# Patient Record
Sex: Female | Born: 1985 | Race: Black or African American | Hispanic: No | Marital: Single | Smoking: Never smoker
Health system: Southern US, Community
[De-identification: ages and names within clinical notes are randomized; demographics above are authoritative.]

## PROBLEM LIST (undated history)

## (undated) DIAGNOSIS — I82409 Acute embolism and thrombosis of unspecified deep veins of unspecified lower extremity: Secondary | ICD-10-CM

## (undated) DIAGNOSIS — E079 Disorder of thyroid, unspecified: Secondary | ICD-10-CM

## (undated) DIAGNOSIS — E119 Type 2 diabetes mellitus without complications: Secondary | ICD-10-CM

## (undated) DIAGNOSIS — I2699 Other pulmonary embolism without acute cor pulmonale: Secondary | ICD-10-CM

## (undated) DIAGNOSIS — M1990 Unspecified osteoarthritis, unspecified site: Secondary | ICD-10-CM

## (undated) DIAGNOSIS — M329 Systemic lupus erythematosus, unspecified: Secondary | ICD-10-CM

## (undated) HISTORY — PX: PACEMAKER INSERTION: SHX728

---

## 2015-01-17 ENCOUNTER — Encounter (HOSPITAL_COMMUNITY): Payer: Self-pay | Admitting: Emergency Medicine

## 2015-01-17 DIAGNOSIS — N049 Nephrotic syndrome with unspecified morphologic changes: Secondary | ICD-10-CM | POA: Diagnosis present

## 2015-01-17 DIAGNOSIS — N189 Chronic kidney disease, unspecified: Secondary | ICD-10-CM | POA: Diagnosis present

## 2015-01-17 DIAGNOSIS — Z794 Long term (current) use of insulin: Secondary | ICD-10-CM

## 2015-01-17 DIAGNOSIS — E079 Disorder of thyroid, unspecified: Secondary | ICD-10-CM | POA: Diagnosis present

## 2015-01-17 DIAGNOSIS — E1165 Type 2 diabetes mellitus with hyperglycemia: Secondary | ICD-10-CM | POA: Diagnosis present

## 2015-01-17 DIAGNOSIS — Z882 Allergy status to sulfonamides status: Secondary | ICD-10-CM

## 2015-01-17 DIAGNOSIS — Z950 Presence of cardiac pacemaker: Secondary | ICD-10-CM

## 2015-01-17 DIAGNOSIS — J9811 Atelectasis: Secondary | ICD-10-CM | POA: Diagnosis present

## 2015-01-17 DIAGNOSIS — M1990 Unspecified osteoarthritis, unspecified site: Secondary | ICD-10-CM | POA: Diagnosis present

## 2015-01-17 DIAGNOSIS — Z79899 Other long term (current) drug therapy: Secondary | ICD-10-CM

## 2015-01-17 DIAGNOSIS — Z7952 Long term (current) use of systemic steroids: Secondary | ICD-10-CM

## 2015-01-17 DIAGNOSIS — I499 Cardiac arrhythmia, unspecified: Secondary | ICD-10-CM | POA: Diagnosis present

## 2015-01-17 DIAGNOSIS — Z880 Allergy status to penicillin: Secondary | ICD-10-CM

## 2015-01-17 DIAGNOSIS — N179 Acute kidney failure, unspecified: Principal | ICD-10-CM | POA: Diagnosis present

## 2015-01-17 DIAGNOSIS — Z86711 Personal history of pulmonary embolism: Secondary | ICD-10-CM

## 2015-01-17 DIAGNOSIS — K859 Acute pancreatitis, unspecified: Secondary | ICD-10-CM | POA: Diagnosis present

## 2015-01-17 DIAGNOSIS — M549 Dorsalgia, unspecified: Secondary | ICD-10-CM | POA: Diagnosis present

## 2015-01-17 DIAGNOSIS — M329 Systemic lupus erythematosus, unspecified: Secondary | ICD-10-CM | POA: Diagnosis present

## 2015-01-17 DIAGNOSIS — I517 Cardiomegaly: Secondary | ICD-10-CM | POA: Diagnosis present

## 2015-01-17 DIAGNOSIS — G8929 Other chronic pain: Secondary | ICD-10-CM | POA: Diagnosis present

## 2015-01-17 DIAGNOSIS — D638 Anemia in other chronic diseases classified elsewhere: Secondary | ICD-10-CM | POA: Diagnosis present

## 2015-01-17 DIAGNOSIS — N2889 Other specified disorders of kidney and ureter: Secondary | ICD-10-CM | POA: Diagnosis present

## 2015-01-17 DIAGNOSIS — Z9114 Patient's other noncompliance with medication regimen: Secondary | ICD-10-CM | POA: Diagnosis present

## 2015-01-17 NOTE — ED Notes (Signed)
Pt. reports central chest pain and mid back pain with SOB onset last week , denies nausea or diaphoresis . No cough or congestion . Denies fever or chills.

## 2015-01-18 ENCOUNTER — Observation Stay (HOSPITAL_COMMUNITY): Payer: Medicaid Other

## 2015-01-18 ENCOUNTER — Inpatient Hospital Stay (HOSPITAL_COMMUNITY): Payer: Medicaid Other

## 2015-01-18 ENCOUNTER — Encounter (HOSPITAL_COMMUNITY): Payer: Self-pay

## 2015-01-18 ENCOUNTER — Emergency Department (HOSPITAL_COMMUNITY): Payer: Medicaid Other

## 2015-01-18 ENCOUNTER — Inpatient Hospital Stay (HOSPITAL_COMMUNITY)
Admission: EM | Admit: 2015-01-18 | Discharge: 2015-01-20 | DRG: 682 | Discharge status: Against Medical Advice | Payer: Medicaid Other | Attending: Internal Medicine | Admitting: Internal Medicine

## 2015-01-18 DIAGNOSIS — K859 Acute pancreatitis without necrosis or infection, unspecified: Secondary | ICD-10-CM

## 2015-01-18 DIAGNOSIS — M329 Systemic lupus erythematosus, unspecified: Secondary | ICD-10-CM

## 2015-01-18 DIAGNOSIS — N184 Chronic kidney disease, stage 4 (severe): Secondary | ICD-10-CM

## 2015-01-18 DIAGNOSIS — N049 Nephrotic syndrome with unspecified morphologic changes: Secondary | ICD-10-CM | POA: Diagnosis present

## 2015-01-18 DIAGNOSIS — I499 Cardiac arrhythmia, unspecified: Secondary | ICD-10-CM | POA: Diagnosis present

## 2015-01-18 DIAGNOSIS — M1990 Unspecified osteoarthritis, unspecified site: Secondary | ICD-10-CM | POA: Diagnosis present

## 2015-01-18 DIAGNOSIS — Z79899 Other long term (current) drug therapy: Secondary | ICD-10-CM | POA: Diagnosis not present

## 2015-01-18 DIAGNOSIS — N189 Chronic kidney disease, unspecified: Secondary | ICD-10-CM | POA: Diagnosis present

## 2015-01-18 DIAGNOSIS — N179 Acute kidney failure, unspecified: Secondary | ICD-10-CM | POA: Diagnosis present

## 2015-01-18 DIAGNOSIS — Z9114 Patient's other noncompliance with medication regimen: Secondary | ICD-10-CM | POA: Diagnosis present

## 2015-01-18 DIAGNOSIS — Z86711 Personal history of pulmonary embolism: Secondary | ICD-10-CM | POA: Diagnosis not present

## 2015-01-18 DIAGNOSIS — E1165 Type 2 diabetes mellitus with hyperglycemia: Secondary | ICD-10-CM | POA: Diagnosis present

## 2015-01-18 DIAGNOSIS — E079 Disorder of thyroid, unspecified: Secondary | ICD-10-CM | POA: Diagnosis present

## 2015-01-18 DIAGNOSIS — M549 Dorsalgia, unspecified: Secondary | ICD-10-CM | POA: Diagnosis present

## 2015-01-18 DIAGNOSIS — D638 Anemia in other chronic diseases classified elsewhere: Secondary | ICD-10-CM | POA: Diagnosis present

## 2015-01-18 DIAGNOSIS — Z880 Allergy status to penicillin: Secondary | ICD-10-CM | POA: Diagnosis not present

## 2015-01-18 DIAGNOSIS — R079 Chest pain, unspecified: Secondary | ICD-10-CM

## 2015-01-18 DIAGNOSIS — D649 Anemia, unspecified: Secondary | ICD-10-CM

## 2015-01-18 DIAGNOSIS — N289 Disorder of kidney and ureter, unspecified: Secondary | ICD-10-CM

## 2015-01-18 DIAGNOSIS — J9811 Atelectasis: Secondary | ICD-10-CM | POA: Diagnosis present

## 2015-01-18 DIAGNOSIS — Z794 Long term (current) use of insulin: Secondary | ICD-10-CM | POA: Diagnosis not present

## 2015-01-18 DIAGNOSIS — N2889 Other specified disorders of kidney and ureter: Secondary | ICD-10-CM | POA: Diagnosis present

## 2015-01-18 DIAGNOSIS — Z950 Presence of cardiac pacemaker: Secondary | ICD-10-CM | POA: Diagnosis not present

## 2015-01-18 DIAGNOSIS — E119 Type 2 diabetes mellitus without complications: Secondary | ICD-10-CM

## 2015-01-18 DIAGNOSIS — G8929 Other chronic pain: Secondary | ICD-10-CM | POA: Diagnosis present

## 2015-01-18 DIAGNOSIS — Z882 Allergy status to sulfonamides status: Secondary | ICD-10-CM | POA: Diagnosis not present

## 2015-01-18 DIAGNOSIS — I517 Cardiomegaly: Secondary | ICD-10-CM | POA: Diagnosis present

## 2015-01-18 DIAGNOSIS — R509 Fever, unspecified: Secondary | ICD-10-CM

## 2015-01-18 DIAGNOSIS — Z7952 Long term (current) use of systemic steroids: Secondary | ICD-10-CM | POA: Diagnosis not present

## 2015-01-18 HISTORY — DX: Unspecified osteoarthritis, unspecified site: M19.90

## 2015-01-18 HISTORY — DX: Disorder of thyroid, unspecified: E07.9

## 2015-01-18 HISTORY — DX: Type 2 diabetes mellitus without complications: E11.9

## 2015-01-18 HISTORY — DX: Systemic lupus erythematosus, unspecified: M32.9

## 2015-01-18 LAB — CBC
HEMATOCRIT: 21.3 % — AB (ref 36.0–46.0)
Hemoglobin: 6.9 g/dL — CL (ref 12.0–15.0)
MCH: 28.6 pg (ref 26.0–34.0)
MCHC: 32.4 g/dL (ref 30.0–36.0)
MCV: 88.4 fL (ref 78.0–100.0)
Platelets: 323 10*3/uL (ref 150–400)
RBC: 2.41 MIL/uL — ABNORMAL LOW (ref 3.87–5.11)
RDW: 17.4 % — AB (ref 11.5–15.5)
WBC: 7.5 10*3/uL (ref 4.0–10.5)

## 2015-01-18 LAB — RETICULOCYTES
RBC.: 4.42 MIL/uL (ref 3.87–5.11)
RETIC CT PCT: 0.7 % (ref 0.4–3.1)
Retic Count, Absolute: 30.9 10*3/uL (ref 19.0–186.0)

## 2015-01-18 LAB — COMPREHENSIVE METABOLIC PANEL
ALT: 18 U/L (ref 0–35)
AST: 17 U/L (ref 0–37)
Albumin: 2.3 g/dL — ABNORMAL LOW (ref 3.5–5.2)
Alkaline Phosphatase: 55 U/L (ref 39–117)
Anion gap: 10 (ref 5–15)
BUN: 36 mg/dL — ABNORMAL HIGH (ref 6–23)
CO2: 21 mmol/L (ref 19–32)
CREATININE: 2.75 mg/dL — AB (ref 0.50–1.10)
Calcium: 7.7 mg/dL — ABNORMAL LOW (ref 8.4–10.5)
Chloride: 106 mmol/L (ref 96–112)
GFR calc Af Amer: 26 mL/min — ABNORMAL LOW (ref >90)
GFR calc non Af Amer: 22 mL/min — ABNORMAL LOW (ref >90)
Glucose, Bld: 109 mg/dL — ABNORMAL HIGH (ref 70–99)
Potassium: 4.6 mmol/L (ref 3.5–5.1)
Sodium: 137 mmol/L (ref 135–145)
Total Bilirubin: 0.5 mg/dL (ref 0.3–1.2)
Total Protein: 5.2 g/dL — ABNORMAL LOW (ref 6.0–8.3)

## 2015-01-18 LAB — BASIC METABOLIC PANEL
Anion gap: 13 (ref 5–15)
BUN: 38 mg/dL — ABNORMAL HIGH (ref 6–23)
CALCIUM: 7.8 mg/dL — AB (ref 8.4–10.5)
CHLORIDE: 106 mmol/L (ref 96–112)
CO2: 20 mmol/L (ref 19–32)
CREATININE: 3.05 mg/dL — AB (ref 0.50–1.10)
GFR, EST AFRICAN AMERICAN: 23 mL/min — AB (ref >90)
GFR, EST NON AFRICAN AMERICAN: 19 mL/min — AB (ref >90)
Glucose, Bld: 191 mg/dL — ABNORMAL HIGH (ref 70–99)
Potassium: 4.5 mmol/L (ref 3.5–5.1)
SODIUM: 139 mmol/L (ref 135–145)

## 2015-01-18 LAB — I-STAT TROPONIN, ED: Troponin i, poc: 0 ng/mL (ref 0.00–0.08)

## 2015-01-18 LAB — BRAIN NATRIURETIC PEPTIDE: B Natriuretic Peptide: 69.5 pg/mL (ref 0.0–100.0)

## 2015-01-18 LAB — TROPONIN I: Troponin I: <0.03 ng/mL (ref <0.031)

## 2015-01-18 LAB — GLUCOSE, CAPILLARY
GLUCOSE-CAPILLARY: 118 mg/dL — AB (ref 70–99)
GLUCOSE-CAPILLARY: 111 mg/dL — AB (ref 70–99)
GLUCOSE-CAPILLARY: 133 mg/dL — AB (ref 70–99)
Glucose-Capillary: 151 mg/dL — ABNORMAL HIGH (ref 70–99)
Glucose-Capillary: 85 mg/dL (ref 70–99)

## 2015-01-18 LAB — ABO/RH: ABO/RH(D): O POS

## 2015-01-18 LAB — MRSA PCR SCREENING: MRSA by PCR: POSITIVE — AB

## 2015-01-18 LAB — PREPARE RBC (CROSSMATCH)

## 2015-01-18 LAB — CBG MONITORING, ED: GLUCOSE-CAPILLARY: 96 mg/dL (ref 70–99)

## 2015-01-18 LAB — OCCULT BLOOD X 1 CARD TO LAB, STOOL: Fecal Occult Bld: NEGATIVE

## 2015-01-18 LAB — SEDIMENTATION RATE: Sed Rate: 36 mm/hr — ABNORMAL HIGH (ref 0–22)

## 2015-01-18 MED ORDER — SODIUM CHLORIDE 0.9 % IV SOLN
10.0000 mL/h | Freq: Once | INTRAVENOUS | Status: AC
  Administered 2015-01-18: 10 mL/h via INTRAVENOUS

## 2015-01-18 MED ORDER — MORPHINE SULFATE 2 MG/ML IJ SOLN
2.0000 mg | INTRAMUSCULAR | Status: DC | PRN
  Administered 2015-01-18 – 2015-01-19 (×2): 2 mg via INTRAVENOUS
  Filled 2015-01-18 (×2): qty 1

## 2015-01-18 MED ORDER — MUPIROCIN 2 % EX OINT
1.0000 "application " | TOPICAL_OINTMENT | Freq: Two times a day (BID) | CUTANEOUS | Status: DC
  Administered 2015-01-18 – 2015-01-20 (×4): 1 via NASAL
  Filled 2015-01-18: qty 22

## 2015-01-18 MED ORDER — HYDROMORPHONE HCL 1 MG/ML IJ SOLN
1.0000 mg | INTRAMUSCULAR | Status: DC | PRN
  Administered 2015-01-18 (×2): 1 mg via INTRAVENOUS
  Filled 2015-01-18: qty 1

## 2015-01-18 MED ORDER — MORPHINE SULFATE 4 MG/ML IJ SOLN
4.0000 mg | Freq: Once | INTRAMUSCULAR | Status: AC
  Administered 2015-01-18: 4 mg via INTRAVENOUS
  Filled 2015-01-18: qty 1

## 2015-01-18 MED ORDER — HEPARIN (PORCINE) IN NACL 100-0.45 UNIT/ML-% IJ SOLN
1000.0000 [IU]/h | INTRAMUSCULAR | Status: DC
  Administered 2015-01-18 – 2015-01-19 (×2): 1000 [IU]/h via INTRAVENOUS
  Filled 2015-01-18 (×2): qty 250

## 2015-01-18 MED ORDER — OXYCODONE HCL 5 MG PO TABS
5.0000 mg | ORAL_TABLET | ORAL | Status: DC | PRN
  Administered 2015-01-18: 5 mg via ORAL
  Filled 2015-01-18: qty 1

## 2015-01-18 MED ORDER — SODIUM CHLORIDE 0.9 % IV SOLN
INTRAVENOUS | Status: DC
  Administered 2015-01-18: 15:00:00 via INTRAVENOUS

## 2015-01-18 MED ORDER — HYDROMORPHONE HCL 1 MG/ML IJ SOLN
INTRAMUSCULAR | Status: AC
  Administered 2015-01-18: 1 mg via INTRAVENOUS
  Filled 2015-01-18: qty 1

## 2015-01-18 MED ORDER — INSULIN ASPART 100 UNIT/ML ~~LOC~~ SOLN
0.0000 [IU] | SUBCUTANEOUS | Status: DC
  Administered 2015-01-18: 1 [IU] via SUBCUTANEOUS
  Administered 2015-01-19 (×2): 2 [IU] via SUBCUTANEOUS
  Administered 2015-01-20: 1 [IU] via SUBCUTANEOUS

## 2015-01-18 MED ORDER — CHLORHEXIDINE GLUCONATE CLOTH 2 % EX PADS
6.0000 | MEDICATED_PAD | Freq: Every day | CUTANEOUS | Status: DC
  Administered 2015-01-19 – 2015-01-20 (×2): 6 via TOPICAL

## 2015-01-18 MED ORDER — SODIUM CHLORIDE 0.9 % IJ SOLN
3.0000 mL | Freq: Two times a day (BID) | INTRAMUSCULAR | Status: DC
  Administered 2015-01-18 – 2015-01-20 (×4): 3 mL via INTRAVENOUS

## 2015-01-18 MED ORDER — OXYCODONE HCL 5 MG PO TABS
5.0000 mg | ORAL_TABLET | ORAL | Status: DC | PRN
  Administered 2015-01-18 – 2015-01-19 (×3): 5 mg via ORAL
  Filled 2015-01-18 (×3): qty 1

## 2015-01-18 MED ORDER — HEPARIN SODIUM (PORCINE) 5000 UNIT/ML IJ SOLN
5000.0000 [IU] | Freq: Three times a day (TID) | INTRAMUSCULAR | Status: DC
  Administered 2015-01-18: 5000 [IU] via SUBCUTANEOUS

## 2015-01-18 NOTE — H&P (Signed)
Hospitalist Admission History and Physical  Patient name: Paige Aguilar Medical record number: [id number] Date of birth: 11-18-1985 Age: 29 y.o. Gender: female  Primary Care Provider: No PCP Per Patient  Chief Complaint: chest pain, dyspnea, anemia, AKI, hyperglycemia  History of Present Illness:This is a 30 y.o. year old female with significant past medical history of SLE, cardiac arrythmia s/p pacemaker, diabetes mellitus  presenting with chest pain, dyspnea, anemia, AK I, hyperglycemia. Patient is originally from [place]. Has relocated across multiple locations. However is [place]. Patient reports 3 weeks of intermittent chest pain and dyspnea. Chest pain predominantly peripheral allergic in nature with pain mainly with deep breathing. No fevers or chills. Chest pain central in nature with some radiation to the back. No nausea or vomiting. No radiation down the left arm or neck. Has a baseline history of lupus as well as diabetes. Has not been followed for this. Reports pacemaker placement approximately 1-2 years ago for cardiac arrhythmia. Is not currently taking any medications. Presented to the ER afebrile, hemodynamically stable. Noted hemoglobin of 6.9, creatinine 3.05. Chest x-ray with mild bilateral atelectasis. Borderline cardiomegaly. BNP WNL. Troponin negative 1. EKG sinus tachycardia with no acute ST or T wave abnormalities.  Assessment and Plan:  Active Problems:   Chest pain   Anemia   AKI (acute kidney injury)   Diabetes mellitus   SLE (systemic lupus erythematosus)   1- Chest Pain  - atypical and pleuritic in nature -trop and EKG WNL -CXR w/ bilateral atelectasis and cardiomegaly  -cycle CEs  -baseline chest CT given lupus w/ no follow up  -baby ASA  -2D ECHO  -noted hx/o cardiac arrythmia s/p pacemaker/ICD -consider cards consult as clinically indicated  2- Anemia -likely anemia of chronic disease in setting of  SLE -denies any overt bleeding -pending 2 unit pRBCs -anemia panel  -follow  3- AKI -unclear of baseline -urinalysis pending -renal u/s -? Lupus nephritis  -renal consult in am  4-Diabetes -A1C -SSI  5-SLE -has had no follow up for this outpatient -baseline ANA, RF, ESR, CRP   FEN/GI: heart healthy, carb modified diet  Prophylaxis: sub q heparin  Disposition: pending further evaluation  Code Status:Full Code    Patient Active Problem List   Diagnosis Date Noted  . Chest pain 01/18/2015   Past Medical History: Past Medical History  Diagnosis Date  . Lupus   . Arthritis   . Thyroid disease   . Diabetes mellitus without complication     Past Surgical History: Past Surgical History  Procedure Laterality Date  . Pacemaker insertion    . Cesarean section      Social History: History   Social History  . Marital Status: Single    Spouse Name: N/A  . Number of Children: N/A  . Years of Education: N/A   Social History Main Topics  . Smoking status: Never Smoker   . Smokeless tobacco: Not on file  . Alcohol Use: No  . Drug Use: No  . Sexual Activity: Not on file   Other Topics Concern  . None   Social History Narrative  . None    Family History: No family history on file.  Allergies: Allergies  Allergen Reactions  . Amoxicillin Rash  . Sulfa Antibiotics Rash    Current Facility-Administered Medications  Medication Dose Route Frequency Provider Last Rate Last Dose  . 0.9 %  sodium chloride infusion  10 mL/hr Intravenous Once Junius Creamer, NP      .  0.9 %  sodium chloride infusion   Intravenous Continuous Deneise Lever, MD      . heparin injection 5,000 Units  5,000 Units Subcutaneous 3 times per day Deneise Lever, MD      . insulin aspart (novoLOG) injection 0-9 Units  0-9 Units Subcutaneous 6 times per day Deneise Lever, MD      . sodium chloride 0.9 % injection 3 mL  3 mL Intravenous Q12H Deneise Lever, MD       Current Outpatient  Prescriptions  Medication Sig Dispense Refill  . CLONIDINE HCL PO Take 1 tablet by mouth daily.    Marland Kitchen FLUoxetine HCl (PROZAC PO) Take 1 capsule by mouth daily.    . insulin aspart (NOVOLOG) 100 UNIT/ML injection Inject 10 Units into the skin daily.    Marland Kitchen oxycodone (ROXICODONE) 30 MG immediate release tablet Take 30 mg by mouth every 4 (four) hours as needed for pain.    Marland Kitchen oxyCODONE-acetaminophen (PERCOCET) 10-325 MG per tablet Take 1 tablet by mouth every 4 (four) hours as needed for pain.    . predniSONE (DELTASONE) 10 MG tablet Take 10 mg by mouth daily with breakfast.    . QUEtiapine Fumarate (SEROQUEL PO) Take 1 tablet by mouth daily.     Review Of Systems: 12 point ROS negative except as noted above in HPI.  Physical Exam: Filed Vitals:   01/18/15 0337  BP: 133/86  Pulse: 88  Temp: 98.1 F (36.7 C)  Resp: 21    General: alert and cooperative HEENT: PERRLA and extra ocular movement intact Heart: S1, S2 normal, no murmur, rub or gallop, regular rate and rhythm Lungs: clear to auscultation, no wheezes or rales and unlabored breathing Abdomen: abdomen is soft without significant tenderness, masses, organomegaly or guarding Extremities: extremities normal, atraumatic, no cyanosis or edema Skin:no rashes Neurology: normal without focal findings  Labs and Imaging: Lab Results  Component Value Date/Time   NA 139 01/18/2015 12:00 AM   K 4.5 01/18/2015 12:00 AM   CL 106 01/18/2015 12:00 AM   CO2 20 01/18/2015 12:00 AM   BUN 38* 01/18/2015 12:00 AM   CREATININE 3.05* 01/18/2015 12:00 AM   GLUCOSE 191* 01/18/2015 12:00 AM   Lab Results  Component Value Date   WBC 7.5 01/18/2015   HGB 6.9* 01/18/2015   HCT 21.3* 01/18/2015   MCV 88.4 01/18/2015   PLT 323 01/18/2015    Dg Chest 2 View  01/18/2015   CLINICAL DATA:  Acute onset of chest pain.  Initial encounter.  EXAM: CHEST  2 VIEW  COMPARISON:  None.  FINDINGS: The lungs are well-aerated. Mild bilateral atelectasis is noted.  There is no evidence of focal opacification, pleural effusion or pneumothorax.  The heart is borderline enlarged. A pacemaker/AICD is noted at the left chest wall, with leads ending at the right atrium and right ventricle. No acute osseous abnormalities are seen. There is mild chronic compression deformity at the lower thoracic spine.  IMPRESSION: Mild bilateral atelectasis noted.  Borderline cardiomegaly seen.   Electronically Signed   By: Garald Balding M.D.   On: 01/18/2015 01:19           Shanda Howells MD  Pager: [phone]

## 2015-01-18 NOTE — ED Notes (Signed)
Charge Nurse informed @0605 /appt time [phone].

## 2015-01-18 NOTE — Plan of Care (Signed)
Problem: Problem: Pain Management Progression Goal: Pain controlled Outcome: Not Progressing Patient[place] pharmacy [place] and in pharmacy in [place] were contacted to verify patient stated home regimen for Percocet and Roxicodone. Neither pharmacy was able to verify dosage. Patient does not have old prescription bottles within her possession. Family also unable to obtain. Will continue to monitor. Dilaudid 1 mg IV & Roxicodone 5 mg tab ordered and given with no relief obtained. Will continue to monitor.

## 2015-01-18 NOTE — Progress Notes (Signed)
Pt arrived to floor at 0625. 1st unit of blood done transfusing before arrival to the floor. Report done at the bedside.   Tera Helperooper, Lusero Nordlund E

## 2015-01-18 NOTE — ED Provider Notes (Signed)
CSN: [id number]     Arrival date & time 01/17/15  2326 History   First MD Initiated Contact with Patient 01/18/15 0109     Chief Complaint  Patient presents with  . Chest Pain     (Consider location/radiation/quality/duration/timing/severity/associated sxs/prior Treatment) HPI Comments: This is a 29 year old female who has been traveling around the [place] area trying to find a place to live.  She's recently moved from [place] with her son.  She is an insulin-dependent diabetic, who is noncompliant with her insulin regime.  She also has a history of lupus and kidney disease is been off medications for a while.  She recently was seen in [place] and had a kidney biopsy which she states was scar tissue presents to our emergency room tonight with central chest discomfort, shortness of breath, nausea without vomiting for the past week.  Denies any fever, but does state that when she ambulates and moves around she gets extremely short of breath  Patient is a 29 y.o. female presenting with chest pain. The history is provided by the patient.  Chest Pain Pain location:  Substernal area Pain quality: aching   Pain radiates to:  Mid back Pain radiates to the back: yes   Pain severity:  Mild Onset quality:  Gradual Timing:  Constant Progression:  Unchanged Chronicity:  New Context: breathing   Relieved by:  Nothing Worsened by:  Nothing tried Ineffective treatments:  None tried Associated symptoms: nausea, shortness of breath and weakness   Associated symptoms: no cough, no dizziness, no lower extremity edema, no numbness, no palpitations, no syncope and not vomiting   Risk factors comment:  Lupus   Past Medical History  Diagnosis Date  . Lupus   . Arthritis   . Thyroid disease   . Diabetes mellitus without complication    Past Surgical History  Procedure Laterality Date  . Pacemaker insertion    . Cesarean section     No family history on file. History  Substance Use Topics   . Smoking status: Never Smoker   . Smokeless tobacco: Not on file  . Alcohol Use: No   OB History    No data available     Review of Systems  HENT: Negative for congestion.   Respiratory: Positive for shortness of breath. Negative for cough.   Cardiovascular: Positive for chest pain. Negative for palpitations and syncope.  Gastrointestinal: Positive for nausea. Negative for vomiting.  Genitourinary: Negative for dysuria.  Neurological: Positive for weakness. Negative for dizziness and numbness.      Allergies  Amoxicillin and Sulfa antibiotics  Home Medications   Prior to Admission medications   Medication Sig Start Date End Date Taking? Authorizing Provider  cloNIDine (CATAPRES) 0.1 MG tablet Take 0.1 mg by mouth 2 (two) times daily.   Yes Historical Provider, MD  FLUoxetine (PROZAC) 10 MG capsule Take 10 mg by mouth 2 (two) times daily.   Yes Historical Provider, MD  insulin aspart (NOVOLOG) 100 UNIT/ML injection Inject 10 Units into the skin daily as needed for high blood sugar.    Yes Historical Provider, MD  oxycodone (ROXICODONE) 30 MG immediate release tablet Take 30 mg by mouth every 4 (four) hours as needed for pain.   Yes Historical Provider, MD  oxyCODONE-acetaminophen (PERCOCET) 10-325 MG per tablet Take 1 tablet by mouth every 4 (four) hours as needed for pain.   Yes Historical Provider, MD  predniSONE (DELTASONE) 10 MG tablet Take 10 mg by mouth daily with breakfast.  Yes Historical Provider, MD  QUEtiapine (SEROQUEL) 50 MG tablet Take 50 mg by mouth at bedtime.   Yes Historical Provider, MD   BP 137/96 mmHg  Pulse 116  Temp(Src) 97.5 F (36.4 C) (Oral)  Resp 26  Ht 4\' 11"  (1.499 m)  Wt 146 lb 6.4 oz (66.407 kg)  BMI 29.55 kg/m2  SpO2 100% Physical Exam  Constitutional: She is oriented to person, place, and time. She appears well-developed and well-nourished.  HENT:  Head: Normocephalic.  Eyes: Pupils are equal, round, and reactive to light.  Neck:  Normal range of motion.  Cardiovascular: Normal rate and regular rhythm.   Pulmonary/Chest: Effort normal and breath sounds normal.  Abdominal: Soft. There is no tenderness.  Musculoskeletal: Normal range of motion. She exhibits no edema or tenderness.  Neurological: She is alert and oriented to person, place, and time.  Skin: Skin is warm.  Nursing note and vitals reviewed.   ED Course  Procedures (including critical care time) Labs Review Labs Reviewed  MRSA PCR SCREENING - Abnormal; Notable for the following:    MRSA by PCR POSITIVE (*)    All other components within normal limits  CBC - Abnormal; Notable for the following:    RBC 2.41 (*)    Hemoglobin 6.9 (*)    HCT 21.3 (*)    RDW 17.4 (*)    All other components within normal limits  BASIC METABOLIC PANEL - Abnormal; Notable for the following:    Glucose, Bld 191 (*)    BUN 38 (*)    Creatinine, Ser 3.05 (*)    Calcium 7.8 (*)    GFR calc non Af Amer 19 (*)    GFR calc Af Amer 23 (*)    All other components within normal limits  VITAMIN B12 - Abnormal; Notable for the following:    Vitamin B-12 >2000 (*)    All other components within normal limits  IRON AND TIBC - Abnormal; Notable for the following:    TIBC 158 (*)    UIBC 85 (*)    All other components within normal limits  FERRITIN - Abnormal; Notable for the following:    Ferritin 843 (*)    All other components within normal limits  COMPREHENSIVE METABOLIC PANEL - Abnormal; Notable for the following:    Glucose, Bld 109 (*)    BUN 36 (*)    Creatinine, Ser 2.75 (*)    Calcium 7.7 (*)    Total Protein 5.2 (*)    Albumin 2.3 (*)    GFR calc non Af Amer 22 (*)    GFR calc Af Amer 26 (*)    All other components within normal limits  CBC WITH DIFFERENTIAL/PLATELET - Abnormal; Notable for the following:    RBC 3.41 (*)    Hemoglobin 9.6 (*)    HCT 28.7 (*)    RDW 18.0 (*)    All other components within normal limits  HEMOGLOBIN A1C - Abnormal; Notable  for the following:    Hgb A1c MFr Bld 5.9 (*)    All other components within normal limits  SEDIMENTATION RATE - Abnormal; Notable for the following:    Sed Rate 36 (*)    All other components within normal limits  C-REACTIVE PROTEIN - Abnormal; Notable for the following:    CRP 0.9 (*)    All other components within normal limits  GLUCOSE, CAPILLARY - Abnormal; Notable for the following:    Glucose-Capillary 118 (*)    All  other components within normal limits  COMPREHENSIVE METABOLIC PANEL - Abnormal; Notable for the following:    BUN 37 (*)    Creatinine, Ser 2.78 (*)    Calcium 7.7 (*)    Total Protein 5.1 (*)    Albumin 2.3 (*)    GFR calc non Af Amer 22 (*)    GFR calc Af Amer 25 (*)    All other components within normal limits  CBC WITH DIFFERENTIAL/PLATELET - Abnormal; Notable for the following:    RBC 3.47 (*)    Hemoglobin 9.6 (*)    HCT 29.3 (*)    RDW 17.9 (*)    All other components within normal limits  GLUCOSE, CAPILLARY - Abnormal; Notable for the following:    Glucose-Capillary 111 (*)    All other components within normal limits  HEPARIN LEVEL (UNFRACTIONATED) - Abnormal; Notable for the following:    Heparin Unfractionated 0.23 (*)    All other components within normal limits  GLUCOSE, CAPILLARY - Abnormal; Notable for the following:    Glucose-Capillary 133 (*)    All other components within normal limits  GLUCOSE, CAPILLARY - Abnormal; Notable for the following:    Glucose-Capillary 151 (*)    All other components within normal limits  LIPID PANEL - Abnormal; Notable for the following:    Cholesterol 226 (*)    Triglycerides 202 (*)    LDL Cholesterol 134 (*)    All other components within normal limits  URINALYSIS, ROUTINE W REFLEX MICROSCOPIC - Abnormal; Notable for the following:    APPearance CLOUDY (*)    Protein, ur >300 (*)    All other components within normal limits  ANTI-DNA ANTIBODY, DOUBLE-STRANDED - Abnormal; Notable for the following:     ds DNA Ab 220 (*)    All other components within normal limits  URINE MICROSCOPIC-ADD ON - Abnormal; Notable for the following:    Squamous Epithelial / LPF FEW (*)    All other components within normal limits  GLUCOSE, CAPILLARY - Abnormal; Notable for the following:    Glucose-Capillary 297 (*)    All other components within normal limits  PROTEIN / CREATININE RATIO, URINE - Abnormal; Notable for the following:    Protein Creatinine Ratio 3.46 (*)    All other components within normal limits  C3 COMPLEMENT - Abnormal; Notable for the following:    C3 Complement 39 (*)    All other components within normal limits  C4 COMPLEMENT - Abnormal; Notable for the following:    Complement C4, Body Fluid 11 (*)    All other components within normal limits  GLUCOSE, CAPILLARY - Abnormal; Notable for the following:    Glucose-Capillary 113 (*)    All other components within normal limits  CBC WITH DIFFERENTIAL/PLATELET - Abnormal; Notable for the following:    RBC 3.16 (*)    Hemoglobin 8.7 (*)    HCT 27.2 (*)    RDW 17.9 (*)    All other components within normal limits  COMPREHENSIVE METABOLIC PANEL - Abnormal; Notable for the following:    BUN 35 (*)    Creatinine, Ser 2.92 (*)    Calcium 7.7 (*)    Total Protein 4.9 (*)    Albumin 2.2 (*)    GFR calc non Af Amer 20 (*)    GFR calc Af Amer 24 (*)    All other components within normal limits  GLUCOSE, CAPILLARY - Abnormal; Notable for the following:    Glucose-Capillary 110 (*)  All other components within normal limits  GLUCOSE, CAPILLARY - Abnormal; Notable for the following:    Glucose-Capillary 102 (*)    All other components within normal limits  CBC - Abnormal; Notable for the following:    Hemoglobin 11.0 (*)    HCT 32.8 (*)    RDW 18.2 (*)    All other components within normal limits  GLUCOSE, CAPILLARY - Abnormal; Notable for the following:    Glucose-Capillary 63 (*)    All other components within normal limits   GLUCOSE, CAPILLARY - Abnormal; Notable for the following:    Glucose-Capillary 127 (*)    All other components within normal limits  URINE CULTURE  CULTURE, BLOOD (ROUTINE X 2)  CULTURE, BLOOD (ROUTINE X 2)  BRAIN NATRIURETIC PEPTIDE  FOLATE  RETICULOCYTES  TROPONIN I  RHEUMATOID FACTOR  GLUCOSE, CAPILLARY  OCCULT BLOOD X 1 CARD TO LAB, STOOL  TROPONIN I  TROPONIN I  HEPARIN LEVEL (UNFRACTIONATED)  GLUCOSE, CAPILLARY  LIPASE, BLOOD  GLUCOSE, CAPILLARY  PREGNANCY, URINE  PROTIME-INR  PROTIME-INR  LIPASE, BLOOD  LACTIC ACID, PLASMA  HEPARIN LEVEL (UNFRACTIONATED)  INFLUENZA PANEL BY PCR (TYPE A & B, H1N1)  GLUCOSE, CAPILLARY  I-STAT TROPOININ, ED  CBG MONITORING, ED  TYPE AND SCREEN  ABO/RH  PREPARE RBC (CROSSMATCH)    Imaging Review No results found.   EKG Interpretation   Date/Time:  Monday January 17 2015 23:37:07 EDT Ventricular Rate:  101 PR Interval:  128 QRS Duration: 80 QT Interval:  338 QTC Calculation: 438 R Axis:   8 Text Interpretation:  Sinus tachycardia No previous tracing Confirmed by  Denton Lank  MD, Caryn Bee ([id number]) on 01/18/2015 2:02:38 AM      MDM   Final diagnoses:  Chest pain  AKI (acute kidney injury)  Renal lesion  Pancreatitis  Chest pain, unspecified chest pain type  ARF (acute renal failure)  Fever         Earley Favor, NP 01/27/15 2325  Cathren Laine, MD 02/03/15 1726

## 2015-01-18 NOTE — Progress Notes (Addendum)
Patient Demographics  Paige Aguilar, is a 29 y.o. female, DOB - 07/12/1986, QAS:[id number]  Admit date - 01/18/2015   Admitting Physician Deneise Lever, MD  Outpatient Primary MD for the patient is No PCP Per Patient  LOS - 0   Chief Complaint  Patient presents with  . Chest Pain        Subjective:   [name] today has, No headache, No chest pain, - No Nausea, No new weakness tingling or numbness, No Cough - SOB. Complains of back pain, (chronic).  Assessment & Plan    Active Problems:   Chest pain   Anemia   AKI (acute kidney injury)   Diabetes mellitus   SLE (systemic lupus erythematosus)  Chest Pain  - nontypical and pleuritic in nature, patient with known history of PE diagnosed in August 2015, known history of noncompliance, we'll resume on heparin pending her Hemoccult stool. - EKG WNL -CXR w/ bilateral atelectasis and cardiomegaly  -baseline chest CT given lupus w/ no follow up  -baby ASA  -2D ECHO pending -noted hx/o cardiac arrythmia s/p pacemaker/ICD  Anemia -likely anemia of chronic disease in setting of SLE, baseline 7.4 in October of last year as no one . -denies any melena, bright red blood per rectum, or hematochezia. -transfused  2 unit pRBCs - follow on anemia panel  - We'll check Hemoccult stool.  History of PE  ( CT chest done in October 2015 at [facility]) - Extremely poor historian, with history of noncompliance, initially on Eliquis, then transitioned to [place], patient reports she stopped and anticoagulation before 2 months as she ran out. - We'll resume on heparin drip. And depends on renal function decision will be made for warfarin versus NOAC.  Acute renal failure - At this point unclear etiology, unclear what is her most recent creatinine, reports she was recently seen by nephrology as an outpatient,  had renal biopsy done for 10 days ( currently can't provide any more information,she is contacting family members to see which facility she had done at) - Avoid nephrotoxic medication. - Renal ultrasound showing right kidney lesion at the inferior pole, can't  obtain MRI abdomen given her pacemaker, so will check CT abd without contrast.for further evaluation. 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  Diabetes mellitus - Follow on hemoglobin A1c, continue with his current sliding scale.  SLE - Has no Follow-up as an outpatient, follow on ANA, RF, ESR, CRP   Code Status: Full  Family Communication: No family at bedside  Disposition Plan: Home once stable   Procedures  Total unit packed red blood cell transfusion 4/26   Consults   None   Medications  Scheduled Meds: . heparin  5,000 Units Subcutaneous 3 times per day  . insulin aspart  0-9 Units Subcutaneous 6 times per day  . sodium chloride  3 mL Intravenous Q12H   Continuous Infusions: . sodium chloride 50 mL/hr at 01/18/15 1528   PRN Meds:.HYDROmorphone (DILAUDID) injection, oxyCODONE  DVT Prophylaxis  Will start on heprin gtt  Lab Results  Component Value Date   PLT 323 01/18/2015    Antibiotics    Anti-infectives    None  Objective:   Filed Vitals:   01/18/15 1250 01/18/15 1350 01/18/15 1437 01/18/15 1450  BP: 129/84 136/92 132/91 132/91  Pulse: 96 98 95 103  Temp: 98.6 F (37 C) 98.7 F (37.1 C) 98.7 F (37.1 C) 98.7 F (37.1 C)  TempSrc: Oral Oral Oral Oral  Resp: 15 18 18 12   Height:      Weight:      SpO2: 100% 100% 100% 100%    Wt Readings from Last 3 Encounters:  01/17/15 65.772 kg (145 lb)     Intake/Output Summary (Last 24 hours) at 01/18/15 1712 Last data filed at 01/18/15 1521  Gross per 24 hour  Intake 1849.83 ml  Output   1700 ml  Net 149.83 ml     Physical Exam  Awake Alert, Oriented X 3, No new F.N deficits, Normal  affect [place].AT,PERRAL Supple Neck,No JVD, No cervical lymphadenopathy appriciated.  Symmetrical Chest wall movement, Good air movement bilaterally, CTAB RRR,No Gallops,Rubs or new Murmurs, No Parasternal Heave +ve B.Sounds, Abd Soft, No tenderness, No organomegaly appriciated, No rebound - guarding or rigidity. No Cyanosis, Clubbing or edema, No new Rash or bruise     Data Review   Micro Results Recent Results (from the past 240 hour(s))  MRSA PCR Screening     Status: Abnormal   Collection Time: 01/18/15  6:34 AM  Result Value Ref Range Status   MRSA by PCR POSITIVE (A) NEGATIVE Final    Comment:        The GeneXpert MRSA Assay (FDA approved for NASAL specimens only), is one component of a comprehensive MRSA colonization surveillance program. It is not intended to diagnose MRSA infection nor to guide or monitor treatment for MRSA infections. RESULT CALLED TO, READ BACK BY AND VERIFIED WITHStevphen Meuse AT 3953 01/18/15 BY K BARR     Radiology Reports Dg Chest 2 View  01/18/2015   CLINICAL DATA:  Acute onset of chest pain.  Initial encounter.  EXAM: CHEST  2 VIEW  COMPARISON:  None.  FINDINGS: The lungs are well-aerated. Mild bilateral atelectasis is noted. There is no evidence of focal opacification, pleural effusion or pneumothorax.  The heart is borderline enlarged. A pacemaker/AICD is noted at the left chest wall, with leads ending at the right atrium and right ventricle. No acute osseous abnormalities are seen. There is mild chronic compression deformity at the lower thoracic spine.  IMPRESSION: Mild bilateral atelectasis noted.  Borderline cardiomegaly seen.   Electronically Signed   By: Garald Balding M.D.   On: 01/18/2015 01:19   Ct Chest Wo Contrast  01/18/2015   CLINICAL DATA:  Acute onset of central chest pain and mid back pain. Shortness of breath. Initial encounter.  EXAM: CT CHEST WITHOUT CONTRAST  TECHNIQUE: Multidetector CT imaging of the chest was performed following  the standard protocol without IV contrast.  COMPARISON:  Chest radiograph performed earlier today at 12:05 a.m.  FINDINGS: Trace bilateral pleural effusions are seen, right greater than left. These are somewhat loculated in appearance, with associated atelectasis. No pneumothorax is identified. The lungs are otherwise clear. No masses are seen.  The heart is mildly enlarged. A pacemaker/AICD is noted at the left chest wall, with leads ending at the right atrium and right ventricle. No significant pericardial effusion is seen. No mediastinal lymphadenopathy is appreciated. The great vessels are grossly unremarkable in appearance. The visualized portions of the thyroid gland are unremarkable. No axillary lymphadenopathy is seen.  There is question of mild soft tissue  edema about the pancreas, though this may simply be artifactual in nature. Would correlate for associated symptoms, and consider lipase level for further evaluation. The visualized portions of the liver and spleen are unremarkable in appearance.  IMPRESSION: 1. Question of mild soft tissue edema about the pancreas, though this may simply be artifactual in nature. Would correlate for any associated symptoms, and consider lipase level for further evaluation. 2. Trace bilateral pleural effusions, right greater than left. These are somewhat loculated in appearance, with associated atelectasis. 3. Mild cardiomegaly noted.   Electronically Signed   By: Garald Balding M.D.   On: 01/18/2015 05:33   US Renal  01/18/2015   CLINICAL DATA:  Acute kidney injury.  EXAM: RENAL / URINARY TRACT ULTRASOUND COMPLETE  COMPARISON:  None.  FINDINGS: Right Kidney:  Length: 9.7 cm, within normal limits. The renal parenchyma is somewhat hyperechoic to the index organ, the liver. A 3.2 x 3.2 x 3.3 cm mass lesion is present at the lower pole of the right kidney.  Left Kidney:  Length: 10.8 cm, within normal limits. The left kidney is somewhat hyperechoic to the index organ, the  spleen. No mass lesion is present. There is no hydronephrosis or stone.  Bladder:  Appears normal for degree of bladder distention.  IMPRESSION: 1. The kidneys are somewhat hyperechoic bilaterally. This is nonspecific, but can be seen in the setting of medical renal disease. 2. Hypoechoic mass lesion at the lower pole of the right kidney measures 3.3 cm maximally. While this could represent a complex cyst, neoplasm is also considered. MRI the kidneys without and with contrast is recommended for further evaluation.   Electronically Signed   By: San Morelle M.D.   On: 01/18/2015 11:31    CBC  Recent Labs Lab 01/18/15  WBC 7.5  HGB 6.9*  HCT 21.3*  PLT 323  MCV 88.4  MCH 28.6  MCHC 32.4  RDW 17.4*    Chemistries   Recent Labs Lab 01/18/15  NA 139  K 4.5  CL 106  CO2 20  GLUCOSE 191*  BUN 38*  CREATININE 3.05*  CALCIUM 7.8*   ------------------------------------------------------------------------------------------------------------------ estimated creatinine clearance is 22.2 mL/min (by C-G formula based on Cr of 3.05). ------------------------------------------------------------------------------------------------------------------ No results for input(s): HGBA1C in the last 72 hours. ------------------------------------------------------------------------------------------------------------------ No results for input(s): CHOL, HDL, LDLCALC, TRIG, CHOLHDL, LDLDIRECT in the last 72 hours. ------------------------------------------------------------------------------------------------------------------ No results for input(s): TSH, T4TOTAL, T3FREE, THYROIDAB in the last 72 hours.  Invalid input(s): FREET3 ------------------------------------------------------------------------------------------------------------------ No results for input(s): VITAMINB12, FOLATE, FERRITIN, TIBC, IRON, RETICCTPCT in the last 72 hours.  Coagulation profile No results for input(s): INR,  PROTIME in the last 168 hours.  No results for input(s): DDIMER in the last 72 hours.  Cardiac Enzymes  Recent Labs Lab 01/18/15 1140  TROPONINI <0.03   ------------------------------------------------------------------------------------------------------------------ Invalid input(s): POCBNP     Time Spent in minutes   40 minutes    Rylei Codispoti M.D on 01/18/2015 at 5:12 PM  Between 7am to 7pm - Pager - [phone]  After 7pm go to [email or web address] - password [email or web address]  And look for the night coverage person covering for me after hours  [facility] Office  [phone]   **Disclaimer: This note may have been dictated with voice recognition software. Similar sounding words can inadvertently be transcribed and this note may contain transcription errors which may not have been corrected upon publication of note.**

## 2015-01-18 NOTE — Progress Notes (Signed)
ANTICOAGULATION CONSULT NOTE - Initial Consult  Pharmacy Consult for Heparin Indication:  PE history  Allergies  Allergen Reactions  . Amoxicillin Rash  . Sulfa Antibiotics Rash    Patient Measurements: Height: 4\' 11"  (149.9 cm) Weight: 145 lb (65.772 kg) IBW/kg (Calculated) : 43.2 kg  Vital Signs: Temp: 98.7 F (37.1 C) (04/26 1510) Temp Source: Oral (04/26 1510) BP: 134/92 mmHg (04/26 1510) Pulse Rate: 100 (04/26 1510)  Labs:  Recent Labs  01/18/15 01/18/15 1140  HGB 6.9*  --   HCT 21.3*  --   PLT 323  --   CREATININE 3.05*  --   TROPONINI  --  <0.03    Estimated Creatinine Clearance: 22.2 mL/min (by C-G formula based on Cr of 3.05).   Medical History: Past Medical History  Diagnosis Date  . Lupus   . Arthritis   . Thyroid disease   . Diabetes mellitus without complication     Assessment: 29 year old female previously on anti-coagulation for PE history, but stopped taking.  Pharmacy now asked to dose heparin with low target range due to anemia and renal insufficiency.  She did receive a sq heparin dose at 2 pm  Goal of Therapy:  Heparin level = 0.3 to 0.5 To monitor platelets  Plan:  No heparin bolus Heparin drip at 1000 units / hr Daily heparin level, CBC  Thank you Okey RegalLisa Kdyn Vonbehren, PharmD [phone]  01/18/2015,6:56 PM

## 2015-01-18 NOTE — Progress Notes (Signed)
UR completed 

## 2015-01-18 NOTE — Progress Notes (Signed)
Attending MD notified several times throughout shift about patient's pain level of 10/10 in her back. Patient's home regimen/PTA medication list (located in patient chart) of Roxicodone and percocet was also discussed with MD. MD discussed with pharmacy and wrote orders for smaller dosage than patient's stated routine regimen. Patient has requested to speak with MD about pain medication orders. Waiting for response.

## 2015-01-19 ENCOUNTER — Inpatient Hospital Stay (HOSPITAL_COMMUNITY): Payer: Medicaid Other

## 2015-01-19 DIAGNOSIS — R079 Chest pain, unspecified: Secondary | ICD-10-CM

## 2015-01-19 DIAGNOSIS — D638 Anemia in other chronic diseases classified elsewhere: Secondary | ICD-10-CM

## 2015-01-19 DIAGNOSIS — M329 Systemic lupus erythematosus, unspecified: Secondary | ICD-10-CM

## 2015-01-19 LAB — GLUCOSE, CAPILLARY
GLUCOSE-CAPILLARY: 81 mg/dL (ref 70–99)
GLUCOSE-CAPILLARY: 297 mg/dL — AB (ref 70–99)
GLUCOSE-CAPILLARY: 113 mg/dL — AB (ref 70–99)
Glucose-Capillary: 89 mg/dL (ref 70–99)
Glucose-Capillary: 110 mg/dL — ABNORMAL HIGH (ref 70–99)

## 2015-01-19 LAB — COMPREHENSIVE METABOLIC PANEL
ALBUMIN: 2.3 g/dL — AB (ref 3.5–5.2)
ALT: 18 U/L (ref 0–35)
ALT: 22 U/L (ref 0–35)
AST: 19 U/L (ref 0–37)
AST: 25 U/L (ref 0–37)
Albumin: 2.2 g/dL — ABNORMAL LOW (ref 3.5–5.2)
Alkaline Phosphatase: 57 U/L (ref 39–117)
Alkaline Phosphatase: 65 U/L (ref 39–117)
Anion gap: 5 (ref 5–15)
Anion gap: 8 (ref 5–15)
BILIRUBIN TOTAL: 0.6 mg/dL (ref 0.3–1.2)
BUN: 37 mg/dL — ABNORMAL HIGH (ref 6–23)
BUN: 35 mg/dL — ABNORMAL HIGH (ref 6–23)
CALCIUM: 7.7 mg/dL — AB (ref 8.4–10.5)
CHLORIDE: 109 mmol/L (ref 96–112)
CO2: 23 mmol/L (ref 19–32)
CO2: 23 mmol/L (ref 19–32)
Calcium: 7.7 mg/dL — ABNORMAL LOW (ref 8.4–10.5)
Chloride: 111 mmol/L (ref 96–112)
Creatinine, Ser: 2.78 mg/dL — ABNORMAL HIGH (ref 0.50–1.10)
Creatinine, Ser: 2.92 mg/dL — ABNORMAL HIGH (ref 0.50–1.10)
GFR calc Af Amer: 25 mL/min — ABNORMAL LOW (ref >90)
GFR calc non Af Amer: 22 mL/min — ABNORMAL LOW (ref >90)
GFR calc non Af Amer: 20 mL/min — ABNORMAL LOW (ref >90)
GFR, EST AFRICAN AMERICAN: 24 mL/min — AB (ref >90)
Glucose, Bld: 76 mg/dL (ref 70–99)
Glucose, Bld: 90 mg/dL (ref 70–99)
POTASSIUM: 4.6 mmol/L (ref 3.5–5.1)
Potassium: 4.7 mmol/L (ref 3.5–5.1)
SODIUM: 137 mmol/L (ref 135–145)
Sodium: 142 mmol/L (ref 135–145)
TOTAL PROTEIN: 5.1 g/dL — AB (ref 6.0–8.3)
TOTAL PROTEIN: 4.9 g/dL — AB (ref 6.0–8.3)
Total Bilirubin: 0.3 mg/dL (ref 0.3–1.2)

## 2015-01-19 LAB — URINE MICROSCOPIC-ADD ON

## 2015-01-19 LAB — URINALYSIS, ROUTINE W REFLEX MICROSCOPIC
Bilirubin Urine: NEGATIVE
Glucose, UA: NEGATIVE mg/dL
HGB URINE DIPSTICK: NEGATIVE
Ketones, ur: NEGATIVE mg/dL
Leukocytes, UA: NEGATIVE
Nitrite: NEGATIVE
Specific Gravity, Urine: 1.01 (ref 1.005–1.030)
Urobilinogen, UA: 0.2 mg/dL (ref 0.0–1.0)
pH: 7.5 (ref 5.0–8.0)

## 2015-01-19 LAB — CBC WITH DIFFERENTIAL/PLATELET
Basophils Absolute: 0 10*3/uL (ref 0.0–0.1)
Basophils Absolute: 0 10*3/uL (ref 0.0–0.1)
Basophils Relative: 0 % (ref 0–1)
Basophils Relative: 0 % (ref 0–1)
Eosinophils Absolute: 0 10*3/uL (ref 0.0–0.7)
Eosinophils Absolute: 0 10*3/uL (ref 0.0–0.7)
Eosinophils Relative: 0 % (ref 0–5)
Eosinophils Relative: 0 % (ref 0–5)
HCT: 27.2 % — ABNORMAL LOW (ref 36.0–46.0)
HEMATOCRIT: 29.3 % — AB (ref 36.0–46.0)
HEMOGLOBIN: 9.6 g/dL — AB (ref 12.0–15.0)
Hemoglobin: 8.7 g/dL — ABNORMAL LOW (ref 12.0–15.0)
LYMPHS ABS: 2 10*3/uL (ref 0.7–4.0)
LYMPHS PCT: 28 % (ref 12–46)
Lymphocytes Relative: 28 % (ref 12–46)
Lymphs Abs: 2.4 10*3/uL (ref 0.7–4.0)
MCH: 27.7 pg (ref 26.0–34.0)
MCH: 27.5 pg (ref 26.0–34.0)
MCHC: 32.8 g/dL (ref 30.0–36.0)
MCHC: 32 g/dL (ref 30.0–36.0)
MCV: 84.4 fL (ref 78.0–100.0)
MCV: 86.1 fL (ref 78.0–100.0)
MONO ABS: 0.4 10*3/uL (ref 0.1–1.0)
MONOS PCT: 4 % (ref 3–12)
Monocytes Absolute: 0.3 10*3/uL (ref 0.1–1.0)
Monocytes Relative: 4 % (ref 3–12)
NEUTROS ABS: 4.9 10*3/uL (ref 1.7–7.7)
NEUTROS PCT: 68 % (ref 43–77)
Neutro Abs: 5.7 10*3/uL (ref 1.7–7.7)
Neutrophils Relative %: 68 % (ref 43–77)
PLATELETS: 220 10*3/uL (ref 150–400)
Platelets: 257 10*3/uL (ref 150–400)
RBC: 3.47 MIL/uL — AB (ref 3.87–5.11)
RBC: 3.16 MIL/uL — ABNORMAL LOW (ref 3.87–5.11)
RDW: 17.9 % — ABNORMAL HIGH (ref 11.5–15.5)
RDW: 17.9 % — ABNORMAL HIGH (ref 11.5–15.5)
WBC: 8.4 10*3/uL (ref 4.0–10.5)
WBC: 7.3 10*3/uL (ref 4.0–10.5)

## 2015-01-19 LAB — LIPID PANEL
CHOLESTEROL: 226 mg/dL — AB (ref 0–200)
HDL: 52 mg/dL (ref >39)
LDL Cholesterol: 134 mg/dL — ABNORMAL HIGH (ref 0–99)
TRIGLYCERIDES: 202 mg/dL — AB (ref <150)
Total CHOL/HDL Ratio: 4.3 RATIO
VLDL: 40 mg/dL (ref 0–40)

## 2015-01-19 LAB — TYPE AND SCREEN
ABO/RH(D): O POS
ANTIBODY SCREEN: NEGATIVE
Unit division: 0
Unit division: 0

## 2015-01-19 LAB — VITAMIN B12: Vitamin B-12: >2000 pg/mL — ABNORMAL HIGH (ref 211–911)

## 2015-01-19 LAB — PROTEIN / CREATININE RATIO, URINE
Creatinine, Urine: 41.86 mg/dL
Protein Creatinine Ratio: 3.46 — ABNORMAL HIGH (ref 0.00–0.15)
Total Protein, Urine: 145 mg/dL

## 2015-01-19 LAB — HEPARIN LEVEL (UNFRACTIONATED)
Heparin Unfractionated: 0.23 IU/mL — ABNORMAL LOW (ref 0.30–0.70)
Heparin Unfractionated: 0.39 IU/mL (ref 0.30–0.70)

## 2015-01-19 LAB — RHEUMATOID FACTOR: RHEUMATOID FACTOR: 9.7 [IU]/mL (ref 0.0–13.9)

## 2015-01-19 LAB — LIPASE, BLOOD: Lipase: 53 U/L (ref 11–59)

## 2015-01-19 LAB — IRON AND TIBC
Iron: 73 ug/dL (ref 42–145)
Saturation Ratios: 46 % (ref 20–55)
TIBC: 158 ug/dL — ABNORMAL LOW (ref 250–470)
UIBC: 85 ug/dL — AB (ref 125–400)

## 2015-01-19 LAB — FOLATE: FOLATE: 7.9 ng/mL

## 2015-01-19 LAB — HEMOGLOBIN A1C
Hgb A1c MFr Bld: 5.9 % — ABNORMAL HIGH (ref 4.8–5.6)
MEAN PLASMA GLUCOSE: 123 mg/dL

## 2015-01-19 LAB — FERRITIN: Ferritin: 843 ng/mL — ABNORMAL HIGH (ref 10–291)

## 2015-01-19 LAB — TROPONIN I: Troponin I: <0.03 ng/mL (ref <0.031)

## 2015-01-19 LAB — PROTIME-INR
INR: 0.98 (ref 0.00–1.49)
Prothrombin Time: 13.1 seconds (ref 11.6–15.2)

## 2015-01-19 LAB — LACTIC ACID, PLASMA: LACTIC ACID, VENOUS: 1 mmol/L (ref 0.5–2.0)

## 2015-01-19 LAB — C-REACTIVE PROTEIN: CRP: 0.9 mg/dL — AB (ref <0.60)

## 2015-01-19 LAB — PREGNANCY, URINE: PREG TEST UR: NEGATIVE

## 2015-01-19 MED ORDER — HYDROCODONE-ACETAMINOPHEN 5-325 MG PO TABS
1.0000 | ORAL_TABLET | Freq: Three times a day (TID) | ORAL | Status: DC | PRN
  Administered 2015-01-19 – 2015-01-20 (×3): 1 via ORAL
  Filled 2015-01-19 (×3): qty 1

## 2015-01-19 MED ORDER — SODIUM CHLORIDE 0.9 % IV BOLUS (SEPSIS)
500.0000 mL | Freq: Once | INTRAVENOUS | Status: AC
  Administered 2015-01-19: 500 mL via INTRAVENOUS

## 2015-01-19 MED ORDER — SODIUM CHLORIDE 0.9 % IV SOLN
INTRAVENOUS | Status: AC
  Administered 2015-01-19: 20:00:00 via INTRAVENOUS

## 2015-01-19 MED ORDER — WARFARIN VIDEO
Freq: Once | Status: AC
Start: 2015-01-19 — End: 2015-01-19
  Administered 2015-01-19: 11:00:00

## 2015-01-19 MED ORDER — PATIENT'S GUIDE TO USING COUMADIN BOOK
Freq: Once | Status: AC
  Administered 2015-01-19: 11:00:00
  Filled 2015-01-19: qty 1

## 2015-01-19 MED ORDER — WARFARIN SODIUM 7.5 MG PO TABS
7.5000 mg | ORAL_TABLET | Freq: Once | ORAL | Status: AC
  Administered 2015-01-19: 7.5 mg via ORAL
  Filled 2015-01-19: qty 1

## 2015-01-19 MED ORDER — ACETAMINOPHEN 325 MG PO TABS
650.0000 mg | ORAL_TABLET | Freq: Four times a day (QID) | ORAL | Status: DC | PRN
  Administered 2015-01-19: 650 mg via ORAL
  Filled 2015-01-19: qty 2

## 2015-01-19 MED ORDER — WARFARIN - PHARMACIST DOSING INPATIENT: Freq: Every day | Status: DC

## 2015-01-19 MED ORDER — QUETIAPINE FUMARATE 50 MG PO TABS
50.0000 mg | ORAL_TABLET | Freq: Every day | ORAL | Status: DC
  Administered 2015-01-19 (×2): 50 mg via ORAL
  Filled 2015-01-19 (×2): qty 1

## 2015-01-19 NOTE — Plan of Care (Signed)
Problem: Problem: Pain Management Progression Goal: PRESCRIBED ANALGESIA EFFECTIVE Outcome: Not Progressing Patient admitted to self-dosing herself & not following prescription regimen with percocet/oxycodone on occasion to control pain. Current prescribed pain medicine is not effective for managing patient's pain. Patient was given a prescription for Suboxone during a previous hospital admission for withdrawals. MD aware. Will continue to monitor.

## 2015-01-19 NOTE — Progress Notes (Signed)
ANTICOAGULATION CONSULT NOTE - Follow Up Consult  Pharmacy Consult for heparin Indication: h/o PE   Labs:  Recent Labs  01/18/15 01/18/15 1140 01/18/15 1953 01/19/15 0136  HGB 6.9*  --  9.6* 9.6*  HCT 21.3*  --  28.7* 29.3*  PLT 323  --  268 257  HEPARINUNFRC  --   --   --  0.23*  CREATININE 3.05*  --  2.75* 2.78*  TROPONINI  --  <0.03 <0.03  --      Assessment/Plan:  29yo female subtherapeutic on heparin though lab was drawn early after only running 4hr, expect will continue to rise and pt has low goal. Will continue gtt at current rate and check additional level.   Vernard GamblesVeronda Torris House, PharmD, BCPS  01/19/2015,2:23 AM

## 2015-01-19 NOTE — Progress Notes (Signed)
Notified K Kirby about pt's temp 101. Orders received for tylenol PO, 500 cc NS bolus, and blood cultures to be drawn. Will continue to monitor

## 2015-01-19 NOTE — Care Management Note (Signed)
    Page 1 of 1   01/19/2015     10:48:31 AM CARE MANAGEMENT NOTE 01/19/2015  Patient:  Paige Aguilar,Paige Aguilar   Account Number:  [id number]  Date Initiated:  01/19/2015  Documentation initiated by:  GRAVES-BIGELOW,Encarnacion Bole  Subjective/Objective Assessment:   Pt admitted for SOB and CP. Pt has Medicaid and medications cost $3.00. Pt states she uses a variety of pharmacies.     Action/Plan:   CM unable to assist with meds due to insurance. Appointment set up @ the [facility]. CM did call [facility] to see if  they can assist pt once d/c. Transportation resources provided.   No further needs from CM at this time.   Anticipated DC Date:  01/19/2015   Anticipated DC Plan:  HOME/SELF CARE      DC Planning Services  CM consult  [facility]igent [facility]  Medication Assistance      Choice offered to / List presented to:             Status of service:  Completed, signed off Medicare Important Message given?  NO (If response is "NO", the following Medicare IM given date fields will be blank) Date Medicare IM given:   Medicare IM given by:   Date Additional Medicare IM given:   Additional Medicare IM given by:    Discharge Disposition:  HOME/SELF CARE  Per UR Regulation:  Reviewed for med. necessity/level of care/duration of stay  If discussed at Long Length of Stay Meetings, dates discussed:    Comments:

## 2015-01-19 NOTE — Consult Note (Signed)
[facility] Admit Date: 01/18/2015 01/19/2015 Arita Miss Requesting Physician:  Thedore Mins  Reason for Consult:  Lupus, Proteinuria, CKD HPI:  29 year old female seen at the request of Dr. Thedore Mins for evaluation of elevated serum creatinine in the setting of lupus with proteinuria. In the room with the patient is a second cousin. The patient's past history has occurred in [place] as well as [place]. She is now establishing [place] delivered her cousin. She presented here 2 days ago with pleuritic chest pain and dyspnea. She has a history of pulmonary embolus but was not on any anticoagulation. She is currently receiving heparin for anticoagulation. Her creatinine upon presentation was 3.0 and today is 2.78. No past lab values available to me. She tells me that 10 days ago and a shallow hospital she had a renal biopsy. She also has an exophytic mass on the right kidney; it is unclear what was biopsied and the only report she has was a verbal phone call of only scar tissue present. Renal ultrasound here with a 9.7 cm right kidney and a 10.8 cm left kidney. Both have increased echogenicity. There is a right inferior pole 3 x 3 x 3 mass potentially representing a complex cyst. Urinalysis demonstrates 4+ proteinuria and the absence of hematuria and only 3-6 leukocytes per high-power field.   Home medicines for lupus have included prednisone and hydroxychloroquine. She denies ever receiving cyclophosphamide or mycophenolate. She did see a rheumatologist in [place]. She has no uremic symptoms. She tells me that she has had a history of malar rash, arthritis. He looks likely that she has had hypercoagulability. She denies any CNS lupus phenomenon.   CREATININE, SER (mg/dL)  Date Value  16/06/9603 2.78*  01/18/2015 2.75*  01/18/2015 3.05*  ] I/Os: I/O last 3 completed shifts: In: 1976.5 [P.O.:480; I.V.:461.7; Blood:1034.8] Out: 2500 [Urine:2500]  ROS Balance of 12 systems is negative w/  exceptions as above  PMH  Past Medical History  Diagnosis Date  . Lupus   . Arthritis   . Thyroid disease   . Diabetes mellitus without complication    PSH  Past Surgical History  Procedure Laterality Date  . Pacemaker insertion    . Cesarean section     FH No family history on file. SH  reports that she has never smoked. She does not have any smokeless tobacco history on file. She reports that she does not drink alcohol or use illicit drugs. Allergies  Allergies  Allergen Reactions  . Amoxicillin Rash  . Sulfa Antibiotics Rash   Home medications Prior to Admission medications   Medication Sig Start Date End Date Taking? Authorizing Provider  cloNIDine (CATAPRES) 0.1 MG tablet Take 0.1 mg by mouth 2 (two) times daily.   Yes Historical Provider, MD  FLUoxetine (PROZAC) 10 MG capsule Take 10 mg by mouth 2 (two) times daily.   Yes Historical Provider, MD  insulin aspart (NOVOLOG) 100 UNIT/ML injection Inject 10 Units into the skin daily as needed for high blood sugar.    Yes Historical Provider, MD  oxycodone (ROXICODONE) 30 MG immediate release tablet Take 30 mg by mouth every 4 (four) hours as needed for pain.   Yes Historical Provider, MD  oxyCODONE-acetaminophen (PERCOCET) 10-325 MG per tablet Take 1 tablet by mouth every 4 (four) hours as needed for pain.   Yes Historical Provider, MD  predniSONE (DELTASONE) 10 MG tablet Take 10 mg by mouth daily with breakfast.   Yes Historical Provider, MD  QUEtiapine (SEROQUEL) 50 MG  tablet Take 50 mg by mouth at bedtime.   Yes Historical Provider, MD    Current Medications Scheduled Meds: . Chlorhexidine Gluconate Cloth  6 each Topical Q0600  . insulin aspart  0-9 Units Subcutaneous 6 times per day  . mupirocin ointment  1 application Nasal BID  . QUEtiapine  50 mg Oral QHS  . sodium chloride  3 mL Intravenous Q12H  . warfarin  7.5 mg Oral ONCE-1800  . Warfarin - Pharmacist Dosing Inpatient   Does not apply q1800   Continuous  Infusions: . sodium chloride 75 mL/hr at 01/19/15 0743  . heparin 1,000 Units/hr (01/18/15 2141)   PRN Meds:.HYDROcodone-acetaminophen, morphine injection, oxyCODONE  CBC  Recent Labs Lab 01/18/15 01/18/15 1953 01/19/15 0136  WBC 7.5 9.0 8.4  NEUTROABS  --  6.5 5.7  HGB 6.9* 9.6* 9.6*  HCT 21.3* 28.7* 29.3*  MCV 88.4 84.2 84.4  PLT 323 268 257   Basic Metabolic Panel  Recent Labs Lab 01/18/15 01/18/15 1953 01/19/15 0136  NA 139 137 137  K 4.5 4.6 4.7  CL 106 106 109  CO2 20 21 23   GLUCOSE 191* 109* 76  BUN 38* 36* 37*  CREATININE 3.05* 2.75* 2.78*  CALCIUM 7.8* 7.7* 7.7*    Physical Exam  Blood pressure 135/85, pulse 118, temperature 99.3 F (37.4 C), temperature source Oral, resp. rate 25, height 4\' 11"  (1.499 m), weight 65.681 kg (144 lb 12.8 oz), SpO2 100 %. GEN: No acute distress, tearful ENT: NCAT EYES: EOMI CV: Tachycardia, regular, no rub PULM: Clear bilaterally ABD: Soft, nontender SKIN: No rashes or lesions EXT: No peripheral edema   Assessment/Plan 43F with likely CKD and Nephrosis in setting of SLE x12y, admitted with pleuritic CP and Dyspnea concerning for recurrent PE  1. 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 Hopefully we'll obtain records from [facility] in [place] shortly, this is complicated by her inability to even name the hospitals or doctors was cared for her 2. Check UPC, C3, C4 3. dsDNA and ANA pending 4. On prednisone 5. Further recommendations based on past lab work and renal biopsy (assuming this was for her lupus, and not for the renal mass) 6. Will need to closely follow with me in the clinic as well as establish care with rheumatology 2. Pleuritic CP and SOB in setting of hx/o PE 1. On heparin, transition to warfarin 2. Per TRH 3. Renal Mass: needs to see urology 4. DM2   Sabra Heckyan Sanford MD [phone] pgr 01/19/2015, 3:08 PM

## 2015-01-19 NOTE — Progress Notes (Signed)
RN paged this NP secondary to pt asking that her IVF and Heparin gtt be turned off. This NP called RN and spoke to the pt myself. After explaining her possible dx of PE and the risks associated with her being off of anticoagulation medication, she agreed to continue her IVF and the gtt. Requesting home Seroquel. No dose on chart. Pt says it is 50mg . Will let her have that tonight and have pharmacy reconcile the dose tomorrow. KJKG, NP

## 2015-01-19 NOTE — Progress Notes (Signed)
[facility]

## 2015-01-19 NOTE — Progress Notes (Signed)
ANTICOAGULATION CONSULT NOTE  Pharmacy Consult for Heparin and coumadin Indication:  PE history  Allergies  Allergen Reactions  . Amoxicillin Rash  . Sulfa Antibiotics Rash    Patient Measurements: Height: 4\' 11"  (149.9 cm) Weight: 144 lb 12.8 oz (65.681 kg) IBW/kg (Calculated) : 43.2 kg  Vital Signs: Temp: 97.5 F (36.4 C) (04/27 0400) Temp Source: Oral (04/27 0400) BP: 158/89 mmHg (04/27 0400) Pulse Rate: 101 (04/27 0400)  Labs:  Recent Labs  01/18/15 01/18/15 1140 01/18/15 1953 01/19/15 0136 01/19/15 0830  HGB 6.9*  --  9.6* 9.6*  --   HCT 21.3*  --  28.7* 29.3*  --   PLT 323  --  268 257  --   HEPARINUNFRC  --   --   --  0.23* 0.39  CREATININE 3.05*  --  2.75* 2.78*  --   TROPONINI  --  <0.03 <0.03 <0.03  --     Estimated Creatinine Clearance: 24.4 mL/min (by C-G formula based on Cr of 2.78).   Medical History: Past Medical History  Diagnosis Date  . Lupus   . Arthritis   . Thyroid disease   . Diabetes mellitus without complication     Assessment: 29 year old female previously on anti-coagulation for PE history, but stopped taking.  Pharmacy asked to dose heparin with low target range due to anemia and renal insufficiency. Pharmacy consulted to start coumadin because her renal function is currently too poor for a NOAC.  Pt with PE per chest CT October 2015 by [facility].  Per pt she was on Eliquis, then Xarelto but stopped taking.  she was on xarelto 20 qday 07/06/14, now creat 2.78, creat cl ~ 25 ml/min. Pt with SLE.   Heparin started 4/26 ~ 2200 at 1000 units/hr with no bolus.  HL drawn at 0830 therapeutic at 0.39.  Hg 6.9>>9.6 after 2 units of blood transfused.  PLTC OK. Coumadin score = 4  Goal of Therapy:  Heparin level = 0.3 to 0.5 To monitor platelets INR 2 - 3  Plan:  Coumadin 7.5 mg po x 1 dose Add INR to HL to get baseline Coumadin book and video for education Continue Heparin drip at 1000 units/ hr Daily heparin level, CBC and  INR  Herby AbrahamMichelle T. Tucker Minter, Pharm.D. [phone] 01/19/2015 9:43 AM

## 2015-01-19 NOTE — Progress Notes (Signed)
  Echocardiogram 2D Echocardiogram has been performed.  Paige Aguilar, [name] A 01/19/2015, 12:57 PM

## 2015-01-19 NOTE — Progress Notes (Addendum)
Patient Demographics  Paige Aguilar, is a 29 y.o. female, DOB - 1985-12-23, EZV:[id number]  Admit date - 01/18/2015   Admitting Physician Deneise Lever, MD  Outpatient Primary MD for the patient is No PCP Per Patient  LOS - 1   Chief Complaint  Patient presents with  . Chest Pain      Patient was admitted 2 days ago, she is a 29 year old female whose shuttling back-and-forth between [place] and [place] for the last 3 years, moved to [place] 3 days ago. Claims to have undergone right-sided renal biopsy one week ago in [place] in a hospital which she does not remember the name off. She also gains to have been placed on liquids for a PE several months ago in [place] but does not recall where and when. She claims she ran out of liquids few months ago and then could not fill her she did not have a PCP. She also tells [place] that she has been on Suboxone for chronic pain. Her history is very consistent. She currently claims that she's been having acute on chronic back pain which is worse since her renal biopsy one week ago. Upon admission she was found to be in renal failure. Last renal function from several months ago in [place] shows a creatinine of 1.  Subjective:   Normajean Baxter today has, No headache, No chest pain, - No Nausea, No new weakness tingling or numbness, No Cough - SOB. Complains of back pain which is on the right side, limbs and is worse since her renal biopsy which was done in [place] does have some chronic back pain and takes Suboxone .  Assessment & Plan     Chest Pain  - nontypical,inconsistent history, NO SOB, pain is substernal, she's been placed on heparin and Coumadin overlap as she carries a history of PE and lupus. Was supposed to be on Eliquis but stopped it several months ago. We'll check a 2-D echogram along with lower  extremity venous duplex. Regardless she is being treated for blood clots.  -2D ECHO pending   noted hx/o cardiac arrythmia s/p pacemaker/ICD   Anemia -Post was 2 units of packed RBC transfusion on 01/17/2026, H&H stable, anemia panel stable. Hemoccult negative. - We'll resume on heparin dalong with Coumadin overlap.    renal failure likely acute on chronic. Baseline unclear - Last creatinine is under 1 several months ago in [place], has records requested. She claims to have undergone right-sided renal biopsy in a hospital in [place] but she does not recall which. There is a right-sided renal mass/nodule, UA ordered. Renal has been requested to see the patient. She will require outpatient renal and urology follow-up.   Diabetes mellitus 2 - A1c 5.9 on sliding scale   SLE - ESR CRP mildly elevated, have ordered ANA and double-stranded DNA to confirm the diagnosis    ? Pancreatitis on CT scan and questionable ultrasound.  there is no abdominal pain, lipase is normal, we'll place her on clear liquid and monitor clinically. Gentle hydration. ? If this chronic pancreatitis    Right-sided flank pain since renal biopsy. Chronic back pain. UA ordered.  Supportive care.   Code Status: Full  Family Communication: No family at bedside  Disposition  Plan: Home once stable   Procedures  Total unit packed red blood cell transfusion 4/26   Consults   None   Medications  Scheduled Meds: . Chlorhexidine Gluconate Cloth  6 each Topical Q0600  . insulin aspart  0-9 Units Subcutaneous 6 times per day  . mupirocin ointment  1 application Nasal BID  . QUEtiapine  50 mg Oral QHS  . sodium chloride  3 mL Intravenous Q12H  . warfarin  7.5 mg Oral ONCE-1800  . Warfarin - Pharmacist Dosing Inpatient   Does not apply q1800   Continuous Infusions: . sodium chloride 75 mL/hr at 01/19/15 0743  . heparin 1,000 Units/hr (01/18/15 2141)   PRN Meds:.HYDROcodone-acetaminophen,  morphine injection, oxyCODONE  DVT Prophylaxis  Will start on heprin gtt  Lab Results  Component Value Date   PLT 257 01/19/2015    Antibiotics    Anti-infectives    None          Objective:   Filed Vitals:   01/18/15 1510 01/18/15 1954 01/19/15 0004 01/19/15 0400  BP: 134/92 128/90 139/97 158/89  Pulse: 100 106 97 101  Temp: 98.7 F (37.1 C) 98.6 F (37 C) 98.5 F (36.9 C) 97.5 F (36.4 C)  TempSrc: Oral Oral Oral Oral  Resp: 17 18 18 18   Height:      Weight:    65.681 kg (144 lb 12.8 oz)  SpO2: 100% 100% 98%     Wt Readings from Last 3 Encounters:  01/19/15 65.681 kg (144 lb 12.8 oz)     Intake/Output Summary (Last 24 hours) at 01/19/15 1157 Last data filed at 01/19/15 1100  Gross per 24 hour  Intake 1584.42 ml  Output   1700 ml  Net -115.58 ml     Physical Exam  Awake Alert, Oriented X 3, No new F.N deficits, Normal affect [place].AT,PERRAL Supple Neck,No JVD, No cervical lymphadenopathy appriciated.  Symmetrical Chest wall movement, Good air movement bilaterally, CTAB RRR,No Gallops,Rubs or new Murmurs, No Parasternal Heave +ve B.Sounds, Abd Soft, No tenderness, No organomegaly appriciated, No rebound - guarding or rigidity. No Cyanosis, Clubbing or edema, No new Rash or bruise     Data Review   Micro Results Recent Results (from the past 240 hour(s))  MRSA PCR Screening     Status: Abnormal   Collection Time: 01/18/15  6:34 AM  Result Value Ref Range Status   MRSA by PCR POSITIVE (A) NEGATIVE Final    Comment:        The GeneXpert MRSA Assay (FDA approved for NASAL specimens only), is one component of a comprehensive MRSA colonization surveillance program. It is not intended to diagnose MRSA infection nor to guide or monitor treatment for MRSA infections. RESULT CALLED TO, READ BACK BY AND VERIFIED WITH: D ALLEN,RN AT 0905 01/18/15 BY K BARR     Radiology Reports Ct Abdomen Pelvis Wo Contrast  01/18/2015   CLINICAL DATA:  Renal  failure, right inferior renal mass by ultrasound  EXAM: CT ABDOMEN AND PELVIS WITHOUT CONTRAST  TECHNIQUE: Multidetector CT imaging of the abdomen and pelvis was performed following the standard protocol without IV contrast.  COMPARISON:  01/18/2015  FINDINGS: Lower chest: Small pleural effusions, slightly larger on the right and associated bibasilar atelectasis. Pacer wires in the right side of the heart. Normal heart size. Slight pericardial thickening.  Abdomen: Diffuse edema and fluid about the entire pancreatitis. Strandy edema and fluid extends throughout the central mesentery and along the kidneys and pericolic gutters bilaterally. Appearance  is compatible with moderately severe acute pancreatitis. Exam is limited without IV contrast because of an elevated creatinine.  Liver, collapsed gallbladder, biliary system, spleen, and adrenal glands are within normal limits for noncontrast imaging. Kidneys demonstrate punctate nonobstructing intrarenal calculi bilaterally. No hydronephrosis. Right kidney lower pole slightly exophytic soft tissue mass roughly measures 3.5 cm in diameter. This remains indeterminate by noncontrast imaging and cannot be further characterized.  Negative for bowel obstruction, dilatation, ileus, or free air.  Negative for abscess or adenopathy.  Normal aorta.  No aneurysm.  Normal appendix demonstrated.  Pelvis: Urinary bladder unremarkable. Trace pelvic free fluid, likely physiologic. No pelvic fluid collection, abscess, adenopathy, inguinal abnormality, or hernia. Benign pelvic vascular calcifications. gluteal injection granulomas evident. No acute osseous finding.  IMPRESSION: Small pleural effusions with dependent bibasilar atelectasis. Slightly worse on the right.  Free fluid and strandy edema about the entire pancreas and extending throughout the central mesentery and retroperitoneum compatible with acute pancreatitis.  3.5 cm slightly exophytic right lower pole renal mass remains  indeterminate by noncontrast imaging.  Punctate nonobstructing intrarenal calculi bilaterally without hydronephrosis.   Electronically Signed   By: Jerilynn Mages.  Shick M.D.   On: 01/18/2015 22:36   Dg Chest 2 View  01/18/2015   CLINICAL DATA:  Acute onset of chest pain.  Initial encounter.  EXAM: CHEST  2 VIEW  COMPARISON:  None.  FINDINGS: The lungs are well-aerated. Mild bilateral atelectasis is noted. There is no evidence of focal opacification, pleural effusion or pneumothorax.  The heart is borderline enlarged. A pacemaker/AICD is noted at the left chest wall, with leads ending at the right atrium and right ventricle. No acute osseous abnormalities are seen. There is mild chronic compression deformity at the lower thoracic spine.  IMPRESSION: Mild bilateral atelectasis noted.  Borderline cardiomegaly seen.   Electronically Signed   By: Garald Balding M.D.   On: 01/18/2015 01:19   Ct Chest Wo Contrast  01/18/2015   CLINICAL DATA:  Acute onset of central chest pain and mid back pain. Shortness of breath. Initial encounter.  EXAM: CT CHEST WITHOUT CONTRAST  TECHNIQUE: Multidetector CT imaging of the chest was performed following the standard protocol without IV contrast.  COMPARISON:  Chest radiograph performed earlier today at 12:05 a.m.  FINDINGS: Trace bilateral pleural effusions are seen, right greater than left. These are somewhat loculated in appearance, with associated atelectasis. No pneumothorax is identified. The lungs are otherwise clear. No masses are seen.  The heart is mildly enlarged. A pacemaker/AICD is noted at the left chest wall, with leads ending at the right atrium and right ventricle. No significant pericardial effusion is seen. No mediastinal lymphadenopathy is appreciated. The great vessels are grossly unremarkable in appearance. The visualized portions of the thyroid gland are unremarkable. No axillary lymphadenopathy is seen.  There is question of mild soft tissue edema about the pancreas,  though this may simply be artifactual in nature. Would correlate for associated symptoms, and consider lipase level for further evaluation. The visualized portions of the liver and spleen are unremarkable in appearance.  IMPRESSION: 1. Question of mild soft tissue edema about the pancreas, though this may simply be artifactual in nature. Would correlate for any associated symptoms, and consider lipase level for further evaluation. 2. Trace bilateral pleural effusions, right greater than left. These are somewhat loculated in appearance, with associated atelectasis. 3. Mild cardiomegaly noted.   Electronically Signed   By: Garald Balding M.D.   On: 01/18/2015 05:33   US Abdomen  Complete  01/19/2015   CLINICAL DATA:  Pancreatitis  EXAM: ULTRASOUND ABDOMEN COMPLETE  COMPARISON:  CT abdomen pelvis dated 01/18/2015  FINDINGS: Gallbladder: Layering gallstones measuring up to 9 mm. No gallbladder wall thickening or pericholecystic fluid. Negative sonographic Murphy's sign.  Common bile duct: Diameter: 3 mm  Liver: No focal lesion identified. Within normal limits in parenchymal echogenicity.  IVC: No abnormality visualized.  Pancreas: Enlarged pancreatic head (image 60), with associated peripancreatic fluid, corresponding to known pancreatitis.  Spleen: Size and appearance within normal limits.  Right Kidney: Length: 11.1 cm. Echogenic renal parenchyma. 3.4 x 3.7 x 3.1 cm hypoechoic, complex lower pole mass. No hydronephrosis.  Left Kidney: Length: 11.9 cm. Echogenic renal parenchyma. No hydronephrosis.  Abdominal aorta: No aneurysm visualized.  Other findings: Right pleural effusion  IMPRESSION: Enlarged pancreatic head with associated peripancreatic fluid, corresponding to known pancreatitis.  Cholelithiasis, without associated sonographic findings to suggest acute cholecystitis. Common duct measures 3 mm.  3.7 cm hypoechoic, complex right lower pole renal mass, possibly solid. Please note that patient is unlikely to be  a candidate for MRI characterization given ICD.  Echogenic renal parenchyma, suggesting medical renal disease.   Electronically Signed   By: Julian Hy M.D.   On: 01/19/2015 09:59   US Renal  01/18/2015   CLINICAL DATA:  Acute kidney injury.  EXAM: RENAL / URINARY TRACT ULTRASOUND COMPLETE  COMPARISON:  None.  FINDINGS: Right Kidney:  Length: 9.7 cm, within normal limits. The renal parenchyma is somewhat hyperechoic to the index organ, the liver. A 3.2 x 3.2 x 3.3 cm mass lesion is present at the lower pole of the right kidney.  Left Kidney:  Length: 10.8 cm, within normal limits. The left kidney is somewhat hyperechoic to the index organ, the spleen. No mass lesion is present. There is no hydronephrosis or stone.  Bladder:  Appears normal for degree of bladder distention.  IMPRESSION: 1. The kidneys are somewhat hyperechoic bilaterally. This is nonspecific, but can be seen in the setting of medical renal disease. 2. Hypoechoic mass lesion at the lower pole of the right kidney measures 3.3 cm maximally. While this could represent a complex cyst, neoplasm is also considered. MRI the kidneys without and with contrast is recommended for further evaluation.   Electronically Signed   By: San Morelle M.D.   On: 01/18/2015 11:31    CBC  Recent Labs Lab 01/18/15 01/18/15 1953 01/19/15 0136  WBC 7.5 9.0 8.4  HGB 6.9* 9.6* 9.6*  HCT 21.3* 28.7* 29.3*  PLT 323 268 257  MCV 88.4 84.2 84.4  MCH 28.6 28.2 27.7  MCHC 32.4 33.4 32.8  RDW 17.4* 18.0* 17.9*  LYMPHSABS  --  1.8 2.4  MONOABS  --  0.7 0.4  EOSABS  --  0.0 0.0  BASOSABS  --  0.0 0.0    Chemistries   Recent Labs Lab 01/18/15 01/18/15 1953 01/19/15 0136  NA 139 137 137  K 4.5 4.6 4.7  CL 106 106 109  CO2 20 21 23   GLUCOSE 191* 109* 76  BUN 38* 36* 37*  CREATININE 3.05* 2.75* 2.78*  CALCIUM 7.8* 7.7* 7.7*  AST  --  17 19  ALT  --  18 18  ALKPHOS  --  55 57  BILITOT  --  0.5 0.6    ------------------------------------------------------------------------------------------------------------------ estimated creatinine clearance is 24.4 mL/min (by C-G formula based on Cr of 2.78). ------------------------------------------------------------------------------------------------------------------  Recent Labs  01/18/15 1953  HGBA1C 5.9*   ------------------------------------------------------------------------------------------------------------------  Recent  Labs  01/19/15 0830  CHOL 226*  HDL 52  LDLCALC 134*  TRIG 202*  CHOLHDL 4.3   ------------------------------------------------------------------------------------------------------------------ No results for input(s): TSH, T4TOTAL, T3FREE, THYROIDAB in the last 72 hours.  Invalid input(s): FREET3 ------------------------------------------------------------------------------------------------------------------  Recent Labs  01/18/15 1953  VITAMINB12 >2000*  FOLATE 7.9  FERRITIN 843*  TIBC 158*  IRON 73  RETICCTPCT 0.7    Coagulation profile  Recent Labs Lab 01/19/15 0830  INR 0.98    No results for input(s): DDIMER in the last 72 hours.  Cardiac Enzymes  Recent Labs Lab 01/18/15 1140 01/18/15 1953 01/19/15 0136  TROPONINI <0.03 <0.03 <0.03   ------------------------------------------------------------------------------------------------------------------ Invalid input(s): POCBNP     Time Spent in minutes   40 minutes    Lala Lund K M.D on 01/19/2015 at 11:57 AM  Between 7am to 7pm - Pager - [phone]  After 7pm go to [email or web address] - password [email or web address]  And look for the night coverage person covering for me after hours  [facility] Office  [phone]   **Disclaimer: This note may have been dictated with voice recognition software. Similar sounding words can inadvertently be transcribed and this note may contain transcription errors which may not  have been corrected upon publication of note.**

## 2015-01-20 ENCOUNTER — Inpatient Hospital Stay (HOSPITAL_COMMUNITY): Payer: Medicaid Other

## 2015-01-20 LAB — INFLUENZA PANEL BY PCR (TYPE A & B)
H1N1 flu by pcr: NOT DETECTED
INFLAPCR: NEGATIVE
Influenza B By PCR: NEGATIVE

## 2015-01-20 LAB — ANTI-DNA ANTIBODY, DOUBLE-STRANDED: DS DNA AB: 220 [IU]/mL — AB (ref 0–9)

## 2015-01-20 LAB — LIPASE, BLOOD: LIPASE: 52 U/L (ref 11–59)

## 2015-01-20 LAB — GLUCOSE, CAPILLARY
GLUCOSE-CAPILLARY: 127 mg/dL — AB (ref 70–99)
Glucose-Capillary: 102 mg/dL — ABNORMAL HIGH (ref 70–99)
Glucose-Capillary: 63 mg/dL — ABNORMAL LOW (ref 70–99)
Glucose-Capillary: 83 mg/dL (ref 70–99)

## 2015-01-20 LAB — HEPARIN LEVEL (UNFRACTIONATED): Heparin Unfractionated: 0.37 IU/mL (ref 0.30–0.70)

## 2015-01-20 LAB — PROTIME-INR
INR: 1.07 (ref 0.00–1.49)
Prothrombin Time: 14 seconds (ref 11.6–15.2)

## 2015-01-20 LAB — CBC
HEMATOCRIT: 32.8 % — AB (ref 36.0–46.0)
Hemoglobin: 11 g/dL — ABNORMAL LOW (ref 12.0–15.0)
MCH: 28.4 pg (ref 26.0–34.0)
MCHC: 33.5 g/dL (ref 30.0–36.0)
MCV: 84.5 fL (ref 78.0–100.0)
Platelets: 227 10*3/uL (ref 150–400)
RBC: 3.88 MIL/uL (ref 3.87–5.11)
RDW: 18.2 % — ABNORMAL HIGH (ref 11.5–15.5)
WBC: 8.7 10*3/uL (ref 4.0–10.5)

## 2015-01-20 MED ORDER — WARFARIN SODIUM 7.5 MG PO TABS: 7.5000 mg | ORAL_TABLET | Freq: Once | ORAL | Status: DC

## 2015-01-20 MED ORDER — PREDNISONE 10 MG PO TABS
10.0000 mg | ORAL_TABLET | Freq: Every day | ORAL | Status: DC
  Administered 2015-01-20: 10 mg via ORAL
  Filled 2015-01-20: qty 1

## 2015-01-20 MED ORDER — SODIUM CHLORIDE 0.9 % IV SOLN: INTRAVENOUS | Status: DC

## 2015-01-20 NOTE — Progress Notes (Signed)
ANTICOAGULATION CONSULT NOTE  Pharmacy Consult for Heparin and coumadin Indication:  PE history  Allergies  Allergen Reactions  . Amoxicillin Rash  . Sulfa Antibiotics Rash    Patient Measurements: Height: 4\' 11"  (149.9 cm) Weight: 146 lb 6.4 oz (66.407 kg) IBW/kg (Calculated) : 43.2 kg  Vital Signs: Temp: 97.5 F (36.4 C) (04/28 0800) Temp Source: Oral (04/28 0800) BP: 137/96 mmHg (04/28 0800) Pulse Rate: 116 (04/28 0800)  Labs:  Recent Labs  01/18/15 1140 01/18/15 1953 01/19/15 0136 01/19/15 0830 01/19/15 2131 01/20/15 0337 01/20/15 0815  HGB  --  9.6* 9.6*  --  8.7*  --  11.0*  HCT  --  28.7* 29.3*  --  27.2*  --  32.8*  PLT  --  268 257  --  220  --  227  LABPROT  --   --   --  13.1  --  14.0  --   INR  --   --   --  0.98  --  1.07  --   HEPARINUNFRC  --   --  0.23* 0.39  --  0.37  --   CREATININE  --  2.75* 2.78*  --  2.92*  --   --   TROPONINI <0.03 <0.03 <0.03  --   --   --   --     Estimated Creatinine Clearance: 23.3 mL/min (by C-G formula based on Cr of 2.92).   Medical History: Past Medical History  Diagnosis Date  . Lupus   . Arthritis   . Thyroid disease   . Diabetes mellitus without complication     Assessment: 29 year old female previously on anti-coagulation for PE history, but stopped taking.  Pharmacy asked to dose heparin with low target range due to anemia and renal insufficiency. Pharmacy consulted to start coumadin because her renal function is currently too poor for a NOAC.  Pt with PE per chest CT October 2015 by [facility].  Per pt she was on Eliquis, then Xarelto but stopped taking.  she was on xarelto 20 qday 07/06/14, now creat 2.92, creat cl ~ 22 ml/min. Pt with SLE.   Heparin level is therapeutic at 0.37 on 1000 units/hr,  INR 10.7 after 1 dose coumadin.  Hg up to 11 ( got 2 U blood)  PLTC OK. Coumadin score = 4  Goal of Therapy:  Heparin level = 0.3 to 0.5 To monitor platelets INR 2 - 3  Plan:  Repeat Coumadin 7.5 mg po x 1  dose Coumadin book and video for education done 4/27 Continue Heparin drip at 1000 units/ hr Daily heparin level, CBC and INR I spoke with pt about importance of getting PCP to prescribe and follow coumadin - pt confused about how to do this since she is leaving AMA  Herby AbrahamMichelle T. Dora Simeone, Pharm.D. [phone] 01/20/2015 11:00 AM

## 2015-01-20 NOTE — Progress Notes (Signed)
Pt requesting to leave AMA, Dr Thedore MinsSingh notified.

## 2015-01-20 NOTE — Discharge Summary (Addendum)
AMA  Patient at this time expresses desire to leave the Hospital immidiately, patient has been warned that this is not Medically advisable at this time, and can result in Medical complications like Death and Disability, patient understands and accepts the risks involved and assumes full responsibilty of this decision.   Patient was admitted 2 days ago, she is a 29 year old female who claims to have history of lupus, PE, bradycardia with pacemaker, chronic pain requiring narcotics/Suboxone.   Has been shuttling back-and-forth between [place] and [place] for the last 3 years, moved to [place] 3 days ago. Claims to have undergone right-sided renal biopsy one week ago in [place] in a hospital which she does not remember the name off. She also claims to have been placed on Eliquis for a PE several months ago in [place] but does not recall where and when. She claims she ran out of Eliquis few months ago and then could not fill her she did not have a PCP. She also tells [place] that she has been on Suboxone for chronic pain. Her history is very consistent.   She currently claims that she's been having acute on chronic back pain which is worse since her renal biopsy one week ago. Upon admission she was found to be in renal failure. Last renal function from several months ago in [place] shows a creatinine of 1.  She was seen here by renal, workup for lupus, renal failure, PE was underway, we were trying our best to get her records from [place] in [place], note patient is very inconsistent with which facilities she has visited. We finally narrowed down a few facilities based on her last address. However she sided this morning to leave [employer]. Says she does not want any further workup. She has been clearly explained by me that this can lead to  disability or death. She assumes all responsibility.      Thurnell Lose M.D on 01/20/2015 at 11:37 AM  [facility]  Discharge time less than 25 minutes.    Last Note Below                Patient Demographics  Paige Aguilar, is a 29 y.o. female, DOB - [birth year], RPR:[id number]  Admit date - 01/18/2015   Admitting Physician Deneise Lever, MD  Outpatient Primary MD for the patient is No PCP Per Patient  LOS - 2   Chief Complaint  Patient presents with  . Chest Pain      Patient was admitted 2 days ago, she is a 29 year old female whose shuttling back-and-forth between [place] and [place] for the last 3 years, moved to [place] 3 days ago. Claims to have undergone right-sided renal biopsy one week ago in [place] in a hospital which she does not remember the name off. She also gains to have been placed on liquids for a PE several months ago in [place] but does not recall where and when. She claims she ran out of liquids few months ago and then could not fill her she did not have a PCP. She also tells [place] that she has been on Suboxone for chronic pain. Her  history is very consistent. She currently claims that she's been having acute on chronic back pain which is worse since her renal biopsy one week ago. Upon admission she was found to be in renal failure. Last renal function from several months ago in [place] shows a creatinine of 1.  Subjective:   Paige Aguilar today has, No headache, No chest pain, - No Nausea, No new weakness tingling or numbness, No Cough - SOB. Complains of back pain which is on the right side, limbs and is worse since her renal biopsy which was done in [place] does have some chronic back pain and takes Suboxone .  Assessment & Plan     Chest Pain  - nontypical,inconsistent history, NO SOB, pain is substernal, she's been placed on heparin and Coumadin overlap as she carries a history of PE and lupus. Was supposed to be  on Eliquis but stopped it several months ago. We'll check a 2-D echogram along with lower extremity venous duplex. Regardless she is being treated for blood clots.  -2D ECHO pending   noted hx/o cardiac arrythmia s/p pacemaker/ICD   Anemia -Post was 2 units of packed RBC transfusion on 01/17/2026, H&H stable, anemia panel stable. Hemoccult negative. - We'll resume on heparin dalong with Coumadin overlap.    renal failure likely acute on chronic. Baseline unclear - Last creatinine is under 1 several months ago in [place], has records requested. She claims to have undergone right-sided renal biopsy in a hospital in [place] but she does not recall which. There is a right-sided renal mass/nodule, UA ordered. Renal has been requested to see the patient. She will require outpatient renal and urology follow-up.   Diabetes mellitus 2 - A1c 5.9 on sliding scale   SLE - ESR CRP mildly elevated, have ordered ANA and double-stranded DNA to confirm the diagnosis    ? Pancreatitis on CT scan and questionable ultrasound.  there is no abdominal pain, lipase is normal, we'll place her on clear liquid and monitor clinically. Gentle hydration. ? If this chronic pancreatitis    Right-sided flank pain since renal biopsy. Chronic back pain. UA ordered.  Supportive care.   Code Status: Full  Family Communication: No family at bedside  Disposition Plan: Home once stable   Procedures  Total unit packed red blood cell transfusion 4/26   Consults   None   Medications  Scheduled Meds: . Chlorhexidine Gluconate Cloth  6 each Topical Q0600  . insulin aspart  0-9 Units Subcutaneous 6 times per day  . mupirocin ointment  1 application Nasal BID  . predniSONE  10 mg Oral Q breakfast  . QUEtiapine  50 mg Oral QHS  . sodium chloride  3 mL Intravenous Q12H  . warfarin  7.5 mg Oral ONCE-1800  . Warfarin - Pharmacist Dosing Inpatient   Does not apply q1800   Continuous Infusions: .  sodium chloride 75 mL/hr at 01/20/15 0910  . heparin 1,000 Units/hr (01/19/15 2027)   PRN Meds:.acetaminophen, HYDROcodone-acetaminophen, morphine injection, oxyCODONE  DVT Prophylaxis  Will start on heprin gtt  Lab Results  Component Value Date   PLT 227 01/20/2015    Antibiotics    Anti-infectives    None          Objective:   Filed Vitals:   01/20/15 0018 01/20/15 0417 01/20/15 0421 01/20/15 0800  BP:  132/94  137/96  Pulse:    116  Temp: 98.2 F (36.8 C)  98.4 F (36.9 C) 97.5 F (36.4 C)  TempSrc: Oral  Oral Oral  Resp:  19  26  Height:      Weight:   66.407 kg (146 lb 6.4 oz)   SpO2:    100%    Wt Readings from Last 3 Encounters:  01/20/15 66.407 kg (146 lb 6.4 oz)     Intake/Output Summary (Last 24 hours) at 01/20/15 1137 Last data filed at 01/20/15 0421  Gross per 24 hour  Intake      0 ml  Output   1200 ml  Net  -1200 ml     Physical Exam  Awake Alert, Oriented X 3, No new F.N deficits, Normal affect [place].AT,PERRAL Supple Neck,No JVD, No cervical lymphadenopathy appriciated.  Symmetrical Chest wall movement, Good air movement bilaterally, CTAB RRR,No Gallops,Rubs or new Murmurs, No Parasternal Heave +ve B.Sounds, Abd Soft, No tenderness, No organomegaly appriciated, No rebound - guarding or rigidity. No Cyanosis, Clubbing or edema, No new Rash or bruise     Data Review   Micro Results Recent Results (from the past 240 hour(s))  MRSA PCR Screening     Status: Abnormal   Collection Time: 01/18/15  6:34 AM  Result Value Ref Range Status   MRSA by PCR POSITIVE (A) NEGATIVE Final    Comment:        The GeneXpert MRSA Assay (FDA approved for NASAL specimens only), is one component of a comprehensive MRSA colonization surveillance program. It is not intended to diagnose MRSA infection nor to guide or monitor treatment for MRSA infections. RESULT CALLED TO, READ BACK BY AND VERIFIED WITH: D ALLEN,RN AT 0905 01/18/15 BY K BARR     Culture, blood (routine x 2)     Status: None (Preliminary result)   Collection Time: 01/19/15  9:31 PM  Result Value Ref Range Status   Specimen Description BLOOD RIGHT ANTECUBITAL  Final   Special Requests   Final    BOTTLES DRAWN AEROBIC AND ANAEROBIC 3CC AER,2CC ANA   Culture PENDING  Incomplete   Report Status PENDING  Incomplete  Culture, blood (routine x 2)     Status: None (Preliminary result)   Collection Time: 01/19/15  9:41 PM  Result Value Ref Range Status   Specimen Description BLOOD RIGHT ARM  Final   Special Requests   Final    BOTTLES DRAWN AEROBIC AND ANAEROBIC 3CC AER,2CC ANA   Culture PENDING  Incomplete   Report Status PENDING  Incomplete    Radiology Reports Ct Abdomen Pelvis Wo Contrast  01/18/2015   CLINICAL DATA:  Renal failure, right inferior renal mass by ultrasound  EXAM: CT ABDOMEN AND PELVIS WITHOUT CONTRAST  TECHNIQUE: Multidetector CT imaging of the abdomen and pelvis was performed following the standard protocol without IV contrast.  COMPARISON:  01/18/2015  FINDINGS: Lower chest: Small pleural effusions, slightly larger on the right and associated bibasilar atelectasis. Pacer wires in the right side of the heart. Normal heart size. Slight pericardial thickening.  Abdomen: Diffuse edema and fluid about the entire pancreatitis. Strandy edema and fluid extends throughout the central mesentery and along the kidneys and pericolic gutters bilaterally. Appearance is compatible with moderately severe acute pancreatitis. Exam is limited without IV contrast because of an elevated creatinine.  Liver, collapsed gallbladder, biliary system, spleen, and adrenal glands are within normal limits for noncontrast imaging. Kidneys demonstrate punctate nonobstructing intrarenal calculi bilaterally. No hydronephrosis. Right kidney lower pole slightly exophytic soft tissue mass roughly measures 3.5 cm in diameter. This remains indeterminate by noncontrast imaging and cannot be  further  characterized.  Negative for bowel obstruction, dilatation, ileus, or free air.  Negative for abscess or adenopathy.  Normal aorta.  No aneurysm.  Normal appendix demonstrated.  Pelvis: Urinary bladder unremarkable. Trace pelvic free fluid, likely physiologic. No pelvic fluid collection, abscess, adenopathy, inguinal abnormality, or hernia. Benign pelvic vascular calcifications. gluteal injection granulomas evident. No acute osseous finding.  IMPRESSION: Small pleural effusions with dependent bibasilar atelectasis. Slightly worse on the right.  Free fluid and strandy edema about the entire pancreas and extending throughout the central mesentery and retroperitoneum compatible with acute pancreatitis.  3.5 cm slightly exophytic right lower pole renal mass remains indeterminate by noncontrast imaging.  Punctate nonobstructing intrarenal calculi bilaterally without hydronephrosis.   Electronically Signed   By: Jerilynn Mages.  Shick M.D.   On: 01/18/2015 22:36   US Abdomen Complete  01/19/2015   CLINICAL DATA:  Pancreatitis  EXAM: ULTRASOUND ABDOMEN COMPLETE  COMPARISON:  CT abdomen pelvis dated 01/18/2015  FINDINGS: Gallbladder: Layering gallstones measuring up to 9 mm. No gallbladder wall thickening or pericholecystic fluid. Negative sonographic Murphy's sign.  Common bile duct: Diameter: 3 mm  Liver: No focal lesion identified. Within normal limits in parenchymal echogenicity.  IVC: No abnormality visualized.  Pancreas: Enlarged pancreatic head (image 60), with associated peripancreatic fluid, corresponding to known pancreatitis.  Spleen: Size and appearance within normal limits.  Right Kidney: Length: 11.1 cm. Echogenic renal parenchyma. 3.4 x 3.7 x 3.1 cm hypoechoic, complex lower pole mass. No hydronephrosis.  Left Kidney: Length: 11.9 cm. Echogenic renal parenchyma. No hydronephrosis.  Abdominal aorta: No aneurysm visualized.  Other findings: Right pleural effusion  IMPRESSION: Enlarged pancreatic head with associated  peripancreatic fluid, corresponding to known pancreatitis.  Cholelithiasis, without associated sonographic findings to suggest acute cholecystitis. Common duct measures 3 mm.  3.7 cm hypoechoic, complex right lower pole renal mass, possibly solid. Please note that patient is unlikely to be a candidate for MRI characterization given ICD.  Echogenic renal parenchyma, suggesting medical renal disease.   Electronically Signed   By: Julian Hy M.D.   On: 01/19/2015 09:59   Dg Chest Port 1 View  01/20/2015   CLINICAL DATA:  Fever.  Chest pain.  EXAM: PORTABLE CHEST - 1 VIEW  COMPARISON:  01/18/2015.  FINDINGS: The cardiopericardial silhouette is enlarged. Pulmonary vascular congestion. RIGHT pleural effusion is present. Low volume chest. LEFT subclavian dual lead AICD. Low lung volumes are present. Increasing basilar atelectasis.  Compared to the prior exam 01/18/2015, the lung volumes are lower and pulmonary aeration has worsened, particularly at the bases.  IMPRESSION: Worsening aeration with constellation of findings compatible with mild to moderate CHF.   Electronically Signed   By: Dereck Ligas M.D.   On: 01/20/2015 08:06    CBC  Recent Labs Lab 01/18/15 01/18/15 1953 01/19/15 0136 01/19/15 2131 01/20/15 0815  WBC 7.5 9.0 8.4 7.3 8.7  HGB 6.9* 9.6* 9.6* 8.7* 11.0*  HCT 21.3* 28.7* 29.3* 27.2* 32.8*  PLT 323 268 257 220 227  MCV 88.4 84.2 84.4 86.1 84.5  MCH 28.6 28.2 27.7 27.5 28.4  MCHC 32.4 33.4 32.8 32.0 33.5  RDW 17.4* 18.0* 17.9* 17.9* 18.2*  LYMPHSABS  --  1.8 2.4 2.0  --   MONOABS  --  0.7 0.4 0.3  --   EOSABS  --  0.0 0.0 0.0  --   BASOSABS  --  0.0 0.0 0.0  --     Chemistries   Recent Labs Lab 01/18/15 01/18/15 1953 01/19/15 0136 01/19/15 2131  NA 139 137 137 142  K 4.5 4.6 4.7 4.6  CL 106 106 109 111  CO2 20 21 23 23   GLUCOSE 191* 109* 76 90  BUN 38* 36* 37* 35*  CREATININE 3.05* 2.75* 2.78* 2.92*  CALCIUM 7.8* 7.7* 7.7* 7.7*  AST  --  17 19 25   ALT  --   18 18 22   ALKPHOS  --  55 57 65  BILITOT  --  0.5 0.6 0.3   ------------------------------------------------------------------------------------------------------------------ estimated creatinine clearance is 23.3 mL/min (by C-G formula based on Cr of 2.92). ------------------------------------------------------------------------------------------------------------------  Recent Labs  01/18/15 1953  HGBA1C 5.9*   ------------------------------------------------------------------------------------------------------------------  Recent Labs  01/19/15 0830  CHOL 226*  HDL 52  LDLCALC 134*  TRIG 202*  CHOLHDL 4.3   ------------------------------------------------------------------------------------------------------------------ No results for input(s): TSH, T4TOTAL, T3FREE, THYROIDAB in the last 72 hours.  Invalid input(s): FREET3 ------------------------------------------------------------------------------------------------------------------  Recent Labs  01/18/15 1953  VITAMINB12 >2000*  FOLATE 7.9  FERRITIN 843*  TIBC 158*  IRON 73  RETICCTPCT 0.7    Coagulation profile  Recent Labs Lab 01/19/15 0830 01/20/15 0337  INR 0.98 1.07    No results for input(s): DDIMER in the last 72 hours.  Cardiac Enzymes  Recent Labs Lab 01/18/15 1140 01/18/15 1953 01/19/15 0136  TROPONINI <0.03 <0.03 <0.03   ------------------------------------------------------------------------------------------------------------------ Invalid input(s): POCBNP     Time Spent in minutes   40 minutes    Lala Lund K M.D on 01/20/2015 at 11:37 AM  Between 7am to 7pm - Pager - [phone]  After 7pm go to [email or web address] - password [email or web address]  And look for the night coverage person covering for me after hours  [facility] Office  [phone]   **Disclaimer: This note may have been dictated with voice recognition software. Similar sounding words can inadvertently  be transcribed and this note may contain transcription errors which may not have been corrected upon publication of note.**

## 2015-01-20 NOTE — Progress Notes (Signed)
Admit: 01/18/2015 LOS: 2  20F with likely CKD and Nephrosis in setting of SLE x12y, admitted with pleuritic CP and Dyspnea concerning for recurrent PE  Subjective:  No new events She remains unable to name the hospital in [place] where she received care and had kidney biopsy, she doesn't know how to find out...    04/27 0701 - 04/28 0700 In: 619.4 [P.O.:240; I.V.:379.4] Out: 1200 [Urine:1200]  Filed Weights   01/17/15 2339 01/19/15 0400 01/20/15 0421  Weight: 65.772 kg (145 lb) 65.681 kg (144 lb 12.8 oz) 66.407 kg (146 lb 6.4 oz)    Scheduled Meds: . Chlorhexidine Gluconate Cloth  6 each Topical Q0600  . insulin aspart  0-9 Units Subcutaneous 6 times per day  . mupirocin ointment  1 application Nasal BID  . predniSONE  10 mg Oral Q breakfast  . QUEtiapine  50 mg Oral QHS  . sodium chloride  3 mL Intravenous Q12H  . Warfarin - Pharmacist Dosing Inpatient   Does not apply q1800   Continuous Infusions: . sodium chloride 75 mL/hr at 01/20/15 0910  . heparin 1,000 Units/hr (01/19/15 2027)   PRN Meds:.acetaminophen, HYDROcodone-acetaminophen, morphine injection, oxyCODONE  Current Labs: reviewed    Physical Exam:  Blood pressure 137/96, pulse 116, temperature 97.5 F (36.4 C), temperature source Oral, resp. rate 26, height 4\' 11"  (1.499 m), weight 66.407 kg (146 lb 6.4 oz), SpO2 100 %. GEN: No acute distress, tearful ENT: NCAT EYES: EOMI CV: Tachycardia, regular, no rub PULM: Clear bilaterally ABD: Soft, nontender SKIN: No rashes or lesions EXT: No peripheral edema  A/P 1. 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 Hopefully we'll obtain records from [place] in [place] shortly, this is complicated by her inability to even name the hospitals or doctors was cared for her 2. No specific lupus nephritis recommendations pending that, cont prednisone 3. UPC 3.45; C3, C4 pending 4. dsDNA and ANA pending 5. Further recommendations based on past lab work and renal biopsy  (assuming this was for her lupus, and not for the renal mass) 6. Will need to closely follow with me in the clinic as well as establish care with rheumatology 2. Pleuritic CP and SOB in setting of hx/o PE 1. On heparin, transition to warfarin 2. Per TRH 3. Renal Mass: needs to see urology 4. DM2  Sabra Heckyan Amillion Scobee MD 01/20/2015, 10:12 AM   Recent Labs Lab 01/18/15 1953 01/19/15 0136 01/19/15 2131  NA 137 137 142  K 4.6 4.7 4.6  CL 106 109 111  CO2 21 23 23   GLUCOSE 109* 76 90  BUN 36* 37* 35*  CREATININE 2.75* 2.78* 2.92*  CALCIUM 7.7* 7.7* 7.7*    Recent Labs Lab 01/18/15 1953 01/19/15 0136 01/19/15 2131 01/20/15 0815  WBC 9.0 8.4 7.3 8.7  NEUTROABS 6.5 5.7 4.9  --   HGB 9.6* 9.6* 8.7* 11.0*  HCT 28.7* 29.3* 27.2* 32.8*  MCV 84.2 84.4 86.1 84.5  PLT 268 257 220 227

## 2015-01-20 NOTE — Progress Notes (Addendum)
AMA papers signed, educated of risks, urged to follow up outpatient with PCP , educated on signs and symptoms for return to the emergency room, reminded pt that insurance may not reimburse any expenses if she leaves AMA,  pt verbalized understanding, stated she still wants to leave, left ambulatory, accompanied by family member, pt in no acute distress, vital signs stable, alert and oriented.

## 2015-01-21 LAB — URINE CULTURE: Colony Count: 75000

## 2015-01-21 LAB — C4 COMPLEMENT: Complement C4, Body Fluid: 11 mg/dL — ABNORMAL LOW (ref 14–44)

## 2015-01-21 LAB — C3 COMPLEMENT: C3 Complement: 39 mg/dL — ABNORMAL LOW (ref 82–167)

## 2015-01-24 ENCOUNTER — Inpatient Hospital Stay: Payer: Medicaid Other | Admitting: Family Medicine

## 2015-01-26 LAB — CULTURE, BLOOD (ROUTINE X 2)
CULTURE: NO GROWTH
Culture: NO GROWTH

## 2015-04-27 ENCOUNTER — Emergency Department (HOSPITAL_COMMUNITY): Payer: Medicaid Other

## 2015-04-27 ENCOUNTER — Inpatient Hospital Stay (HOSPITAL_COMMUNITY): Payer: Medicaid Other

## 2015-04-27 ENCOUNTER — Inpatient Hospital Stay (HOSPITAL_COMMUNITY)
Admission: EM | Admit: 2015-04-27 | Discharge: 2015-05-02 | DRG: 208 | Disposition: A | Discharge status: Home / Self Care | Payer: Medicaid Other | Attending: Internal Medicine | Admitting: Internal Medicine

## 2015-04-27 ENCOUNTER — Encounter (HOSPITAL_COMMUNITY): Payer: Self-pay | Admitting: General Practice

## 2015-04-27 DIAGNOSIS — R000 Tachycardia, unspecified: Secondary | ICD-10-CM | POA: Diagnosis present

## 2015-04-27 DIAGNOSIS — Z7952 Long term (current) use of systemic steroids: Secondary | ICD-10-CM | POA: Diagnosis not present

## 2015-04-27 DIAGNOSIS — Z86711 Personal history of pulmonary embolism: Secondary | ICD-10-CM

## 2015-04-27 DIAGNOSIS — I509 Heart failure, unspecified: Secondary | ICD-10-CM | POA: Diagnosis not present

## 2015-04-27 DIAGNOSIS — Z79891 Long term (current) use of opiate analgesic: Secondary | ICD-10-CM | POA: Diagnosis not present

## 2015-04-27 DIAGNOSIS — J9601 Acute respiratory failure with hypoxia: Secondary | ICD-10-CM | POA: Diagnosis present

## 2015-04-27 DIAGNOSIS — Z452 Encounter for adjustment and management of vascular access device: Secondary | ICD-10-CM | POA: Diagnosis not present

## 2015-04-27 DIAGNOSIS — N2889 Other specified disorders of kidney and ureter: Secondary | ICD-10-CM | POA: Diagnosis present

## 2015-04-27 DIAGNOSIS — J9600 Acute respiratory failure, unspecified whether with hypoxia or hypercapnia: Secondary | ICD-10-CM | POA: Diagnosis not present

## 2015-04-27 DIAGNOSIS — F1910 Other psychoactive substance abuse, uncomplicated: Secondary | ICD-10-CM | POA: Diagnosis present

## 2015-04-27 DIAGNOSIS — Z79899 Other long term (current) drug therapy: Secondary | ICD-10-CM

## 2015-04-27 DIAGNOSIS — E1122 Type 2 diabetes mellitus with diabetic chronic kidney disease: Secondary | ICD-10-CM | POA: Diagnosis present

## 2015-04-27 DIAGNOSIS — R079 Chest pain, unspecified: Secondary | ICD-10-CM | POA: Diagnosis present

## 2015-04-27 DIAGNOSIS — I2699 Other pulmonary embolism without acute cor pulmonale: Secondary | ICD-10-CM | POA: Diagnosis not present

## 2015-04-27 DIAGNOSIS — I129 Hypertensive chronic kidney disease with stage 1 through stage 4 chronic kidney disease, or unspecified chronic kidney disease: Secondary | ICD-10-CM | POA: Diagnosis present

## 2015-04-27 DIAGNOSIS — Z95810 Presence of automatic (implantable) cardiac defibrillator: Secondary | ICD-10-CM

## 2015-04-27 DIAGNOSIS — R0602 Shortness of breath: Secondary | ICD-10-CM | POA: Diagnosis present

## 2015-04-27 DIAGNOSIS — Z794 Long term (current) use of insulin: Secondary | ICD-10-CM | POA: Diagnosis not present

## 2015-04-27 DIAGNOSIS — I82512 Chronic embolism and thrombosis of left femoral vein: Secondary | ICD-10-CM | POA: Diagnosis present

## 2015-04-27 DIAGNOSIS — N184 Chronic kidney disease, stage 4 (severe): Secondary | ICD-10-CM | POA: Diagnosis present

## 2015-04-27 DIAGNOSIS — N185 Chronic kidney disease, stage 5: Secondary | ICD-10-CM | POA: Diagnosis not present

## 2015-04-27 DIAGNOSIS — I420 Dilated cardiomyopathy: Secondary | ICD-10-CM | POA: Diagnosis present

## 2015-04-27 DIAGNOSIS — N179 Acute kidney failure, unspecified: Secondary | ICD-10-CM | POA: Diagnosis not present

## 2015-04-27 DIAGNOSIS — E875 Hyperkalemia: Secondary | ICD-10-CM | POA: Diagnosis not present

## 2015-04-27 DIAGNOSIS — M3214 Glomerular disease in systemic lupus erythematosus: Secondary | ICD-10-CM | POA: Diagnosis present

## 2015-04-27 DIAGNOSIS — D638 Anemia in other chronic diseases classified elsewhere: Secondary | ICD-10-CM | POA: Diagnosis present

## 2015-04-27 DIAGNOSIS — J9621 Acute and chronic respiratory failure with hypoxia: Secondary | ICD-10-CM | POA: Diagnosis not present

## 2015-04-27 DIAGNOSIS — I5021 Acute systolic (congestive) heart failure: Secondary | ICD-10-CM | POA: Diagnosis present

## 2015-04-27 DIAGNOSIS — M329 Systemic lupus erythematosus, unspecified: Secondary | ICD-10-CM | POA: Diagnosis present

## 2015-04-27 DIAGNOSIS — I8290 Acute embolism and thrombosis of unspecified vein: Secondary | ICD-10-CM | POA: Diagnosis not present

## 2015-04-27 DIAGNOSIS — J9691 Respiratory failure, unspecified with hypoxia: Secondary | ICD-10-CM | POA: Diagnosis present

## 2015-04-27 DIAGNOSIS — Z9119 Patient's noncompliance with other medical treatment and regimen: Secondary | ICD-10-CM | POA: Diagnosis present

## 2015-04-27 DIAGNOSIS — N189 Chronic kidney disease, unspecified: Secondary | ICD-10-CM | POA: Diagnosis not present

## 2015-04-27 DIAGNOSIS — I429 Cardiomyopathy, unspecified: Secondary | ICD-10-CM | POA: Diagnosis not present

## 2015-04-27 LAB — BRAIN NATRIURETIC PEPTIDE: B NATRIURETIC PEPTIDE 5: 3778.7 pg/mL — AB (ref 0.0–100.0)

## 2015-04-27 LAB — BASIC METABOLIC PANEL
ANION GAP: 11 (ref 5–15)
BUN: 50 mg/dL — ABNORMAL HIGH (ref 6–20)
CO2: 19 mmol/L — ABNORMAL LOW (ref 22–32)
Calcium: 8.5 mg/dL — ABNORMAL LOW (ref 8.9–10.3)
Chloride: 113 mmol/L — ABNORMAL HIGH (ref 101–111)
Creatinine, Ser: 3.09 mg/dL — ABNORMAL HIGH (ref 0.44–1.00)
GFR calc Af Amer: 22 mL/min — ABNORMAL LOW (ref >60)
GFR calc non Af Amer: 19 mL/min — ABNORMAL LOW (ref >60)
GLUCOSE: 95 mg/dL (ref 65–99)
Potassium: 3.6 mmol/L (ref 3.5–5.1)
Sodium: 143 mmol/L (ref 135–145)

## 2015-04-27 LAB — I-STAT VENOUS BLOOD GAS, ED
ACID-BASE DEFICIT: 5 mmol/L — AB (ref 0.0–2.0)
Bicarbonate: 20.5 mEq/L (ref 20.0–24.0)
O2 SAT: 24 %
PCO2 VEN: 38.8 mmHg — AB (ref 45.0–50.0)
TCO2: 22 mmol/L (ref 0–100)
pH, Ven: 7.33 — ABNORMAL HIGH (ref 7.250–7.300)
pO2, Ven: 18 mmHg — CL (ref 30.0–45.0)

## 2015-04-27 LAB — TRIGLYCERIDES: Triglycerides: 134 mg/dL (ref <150)

## 2015-04-27 LAB — CBC WITH DIFFERENTIAL/PLATELET
BASOS ABS: 0 10*3/uL (ref 0.0–0.1)
Basophils Relative: 0 % (ref 0–1)
EOS ABS: 0 10*3/uL (ref 0.0–0.7)
Eosinophils Relative: 0 % (ref 0–5)
HCT: 26.1 % — ABNORMAL LOW (ref 36.0–46.0)
Hemoglobin: 8.2 g/dL — ABNORMAL LOW (ref 12.0–15.0)
Lymphocytes Relative: 15 % (ref 12–46)
Lymphs Abs: 2.3 10*3/uL (ref 0.7–4.0)
MCH: 28.8 pg (ref 26.0–34.0)
MCHC: 31.4 g/dL (ref 30.0–36.0)
MCV: 91.6 fL (ref 78.0–100.0)
MONO ABS: 1 10*3/uL (ref 0.1–1.0)
MONOS PCT: 7 % (ref 3–12)
NEUTROS PCT: 78 % — AB (ref 43–77)
Neutro Abs: 12.1 10*3/uL — ABNORMAL HIGH (ref 1.7–7.7)
PLATELETS: 488 10*3/uL — AB (ref 150–400)
RBC: 2.85 MIL/uL — ABNORMAL LOW (ref 3.87–5.11)
RDW: 15.4 % (ref 11.5–15.5)
WBC: 15.4 10*3/uL — ABNORMAL HIGH (ref 4.0–10.5)

## 2015-04-27 LAB — MRSA PCR SCREENING: MRSA BY PCR: NEGATIVE

## 2015-04-27 LAB — TSH: TSH: 1.331 u[IU]/mL (ref 0.350–4.500)

## 2015-04-27 LAB — GLUCOSE, CAPILLARY
GLUCOSE-CAPILLARY: 110 mg/dL — AB (ref 65–99)
GLUCOSE-CAPILLARY: 110 mg/dL — AB (ref 65–99)
Glucose-Capillary: 166 mg/dL — ABNORMAL HIGH (ref 65–99)

## 2015-04-27 LAB — I-STAT CHEM 8, ED
BUN: 47 mg/dL — AB (ref 6–20)
CREATININE: 3 mg/dL — AB (ref 0.44–1.00)
Calcium, Ion: 1.18 mmol/L (ref 1.12–1.23)
Chloride: 113 mmol/L — ABNORMAL HIGH (ref 101–111)
Glucose, Bld: 93 mg/dL (ref 65–99)
HCT: 27 % — ABNORMAL LOW (ref 36.0–46.0)
Hemoglobin: 9.2 g/dL — ABNORMAL LOW (ref 12.0–15.0)
Potassium: 3.5 mmol/L (ref 3.5–5.1)
Sodium: 145 mmol/L (ref 135–145)
TCO2: 18 mmol/L (ref 0–100)

## 2015-04-27 LAB — I-STAT TROPONIN, ED: TROPONIN I, POC: 0.04 ng/mL (ref 0.00–0.08)

## 2015-04-27 LAB — D-DIMER, QUANTITATIVE: D-Dimer, Quant: 3.31 ug/mL-FEU — ABNORMAL HIGH (ref 0.00–0.48)

## 2015-04-27 LAB — I-STAT BETA HCG BLOOD, ED (MC, WL, AP ONLY): I-stat hCG, quantitative: <5 m[IU]/mL (ref <5)

## 2015-04-27 LAB — HEPARIN LEVEL (UNFRACTIONATED): Heparin Unfractionated: 0.22 IU/mL — ABNORMAL LOW (ref 0.30–0.70)

## 2015-04-27 LAB — LACTIC ACID, PLASMA: LACTIC ACID, VENOUS: 1.4 mmol/L (ref 0.5–2.0)

## 2015-04-27 LAB — TROPONIN I: Troponin I: 1.21 ng/mL (ref <0.031)

## 2015-04-27 MED ORDER — SODIUM CHLORIDE 0.9 % IJ SOLN
10.0000 mL | INTRAMUSCULAR | Status: DC | PRN
  Administered 2015-05-01 (×2): 30 mL
  Filled 2015-04-27 (×2): qty 40

## 2015-04-27 MED ORDER — SODIUM CHLORIDE 0.9 % IJ SOLN
10.0000 mL | Freq: Two times a day (BID) | INTRAMUSCULAR | Status: DC
  Administered 2015-04-27 – 2015-04-28 (×2): 10 mL
  Administered 2015-04-28: 20 mL
  Administered 2015-04-29: 40 mL
  Administered 2015-04-29: 10 mL

## 2015-04-27 MED ORDER — ROCURONIUM BROMIDE 50 MG/5ML IV SOLN
INTRAVENOUS | Status: AC
  Filled 2015-04-27: qty 2

## 2015-04-27 MED ORDER — PROPOFOL 1000 MG/100ML IV EMUL
INTRAVENOUS | Status: AC
  Filled 2015-04-27: qty 100

## 2015-04-27 MED ORDER — HEPARIN BOLUS VIA INFUSION
2500.0000 [IU] | Freq: Once | INTRAVENOUS | Status: AC
  Administered 2015-04-27: 2500 [IU] via INTRAVENOUS
  Filled 2015-04-27: qty 2500

## 2015-04-27 MED ORDER — FENTANYL BOLUS VIA INFUSION
50.0000 ug | INTRAVENOUS | Status: DC | PRN
  Filled 2015-04-27: qty 50

## 2015-04-27 MED ORDER — SODIUM CHLORIDE 0.9 % IV SOLN: 250.0000 mL | INTRAVENOUS | Status: DC | PRN

## 2015-04-27 MED ORDER — CHLORHEXIDINE GLUCONATE 0.12 % MT SOLN
15.0000 mL | Freq: Two times a day (BID) | OROMUCOSAL | Status: DC
  Administered 2015-04-27 – 2015-04-28 (×2): 15 mL via OROMUCOSAL
  Filled 2015-04-27 (×2): qty 15

## 2015-04-27 MED ORDER — HEPARIN BOLUS VIA INFUSION
1000.0000 [IU] | Freq: Once | INTRAVENOUS | Status: AC
  Administered 2015-04-27: 1000 [IU] via INTRAVENOUS
  Filled 2015-04-27: qty 1000

## 2015-04-27 MED ORDER — FUROSEMIDE 10 MG/ML IJ SOLN
80.0000 mg | Freq: Two times a day (BID) | INTRAMUSCULAR | Status: AC
  Administered 2015-04-27 – 2015-04-29 (×4): 80 mg via INTRAVENOUS
  Filled 2015-04-27 (×5): qty 8

## 2015-04-27 MED ORDER — INSULIN ASPART 100 UNIT/ML ~~LOC~~ SOLN
0.0000 [IU] | SUBCUTANEOUS | Status: DC
  Administered 2015-04-27: 3 [IU] via SUBCUTANEOUS
  Administered 2015-04-28: 2 [IU] via SUBCUTANEOUS

## 2015-04-27 MED ORDER — FUROSEMIDE 10 MG/ML IJ SOLN
60.0000 mg | Freq: Once | INTRAMUSCULAR | Status: AC
  Administered 2015-04-27: 60 mg via INTRAVENOUS
  Filled 2015-04-27: qty 6

## 2015-04-27 MED ORDER — CETYLPYRIDINIUM CHLORIDE 0.05 % MT LIQD
7.0000 mL | Freq: Four times a day (QID) | OROMUCOSAL | Status: DC
  Administered 2015-04-27 – 2015-04-28 (×4): 7 mL via OROMUCOSAL

## 2015-04-27 MED ORDER — FENTANYL CITRATE (PF) 100 MCG/2ML IJ SOLN
INTRAMUSCULAR | Status: AC
  Administered 2015-04-27: 100 ug via INTRAVENOUS
  Filled 2015-04-27: qty 2

## 2015-04-27 MED ORDER — PREDNISONE 10 MG PO TABS: 10.0000 mg | ORAL_TABLET | Freq: Every day | ORAL | Status: DC

## 2015-04-27 MED ORDER — SUCCINYLCHOLINE CHLORIDE 20 MG/ML IJ SOLN
INTRAMUSCULAR | Status: AC
  Filled 2015-04-27: qty 1

## 2015-04-27 MED ORDER — HYDROCORTISONE NA SUCCINATE PF 100 MG IJ SOLR
50.0000 mg | Freq: Four times a day (QID) | INTRAMUSCULAR | Status: DC
  Administered 2015-04-27 – 2015-04-29 (×8): 50 mg via INTRAVENOUS
  Filled 2015-04-27 (×11): qty 1

## 2015-04-27 MED ORDER — LIDOCAINE HCL (CARDIAC) 20 MG/ML IV SOLN
INTRAVENOUS | Status: AC
  Filled 2015-04-27: qty 5

## 2015-04-27 MED ORDER — ETOMIDATE 2 MG/ML IV SOLN
INTRAVENOUS | Status: AC
  Filled 2015-04-27: qty 20

## 2015-04-27 MED ORDER — LORAZEPAM 2 MG/ML IJ SOLN
0.5000 mg | Freq: Once | INTRAMUSCULAR | Status: AC
Start: 2015-04-27 — End: 2015-04-27
  Administered 2015-04-27: 0.5 mg via INTRAVENOUS
  Filled 2015-04-27: qty 1

## 2015-04-27 MED ORDER — SODIUM CHLORIDE 0.9 % IV SOLN
25.0000 ug/h | INTRAVENOUS | Status: DC
  Administered 2015-04-27: 50 ug/h via INTRAVENOUS
  Filled 2015-04-27: qty 50

## 2015-04-27 MED ORDER — CLONIDINE HCL 0.1 MG PO TABS
0.1000 mg | ORAL_TABLET | Freq: Two times a day (BID) | ORAL | Status: DC
  Administered 2015-04-27 – 2015-04-28 (×3): 0.1 mg
  Filled 2015-04-27 (×5): qty 1

## 2015-04-27 MED ORDER — POTASSIUM CHLORIDE 20 MEQ/15ML (10%) PO SOLN
40.0000 meq | Freq: Once | ORAL | Status: AC
  Administered 2015-04-27: 40 meq
  Filled 2015-04-27: qty 30

## 2015-04-27 MED ORDER — LABETALOL HCL 5 MG/ML IV SOLN
0.5000 mg/min | INTRAVENOUS | Status: DC
  Filled 2015-04-27: qty 100

## 2015-04-27 MED ORDER — ETOMIDATE 2 MG/ML IV SOLN
INTRAVENOUS | Status: DC | PRN
  Administered 2015-04-27: 20 mg via INTRAVENOUS

## 2015-04-27 MED ORDER — ASPIRIN 325 MG PO TABS
325.0000 mg | ORAL_TABLET | Freq: Every day | ORAL | Status: DC
  Administered 2015-04-27 – 2015-04-29 (×3): 325 mg via ORAL
  Filled 2015-04-27 (×3): qty 1

## 2015-04-27 MED ORDER — FENTANYL CITRATE (PF) 100 MCG/2ML IJ SOLN
100.0000 ug | INTRAMUSCULAR | Status: AC | PRN
  Administered 2015-04-27 (×3): 100 ug via INTRAVENOUS
  Filled 2015-04-27 (×2): qty 2

## 2015-04-27 MED ORDER — PANTOPRAZOLE SODIUM 40 MG IV SOLR
40.0000 mg | Freq: Every day | INTRAVENOUS | Status: DC
  Administered 2015-04-27: 40 mg via INTRAVENOUS
  Filled 2015-04-27 (×2): qty 40

## 2015-04-27 MED ORDER — FENTANYL CITRATE (PF) 100 MCG/2ML IJ SOLN: 50.0000 ug | Freq: Once | INTRAMUSCULAR | Status: DC

## 2015-04-27 MED ORDER — NITROGLYCERIN IN D5W 200-5 MCG/ML-% IV SOLN
INTRAVENOUS | Status: AC
  Filled 2015-04-27: qty 250

## 2015-04-27 MED ORDER — METHYLPREDNISOLONE SODIUM SUCC 40 MG IJ SOLR: 40.0000 mg | Freq: Every day | INTRAMUSCULAR | Status: DC

## 2015-04-27 MED ORDER — HEPARIN (PORCINE) IN NACL 100-0.45 UNIT/ML-% IJ SOLN
1150.0000 [IU]/h | INTRAMUSCULAR | Status: AC
  Administered 2015-04-27 (×3): 800 [IU]/h via INTRAVENOUS
  Administered 2015-04-28: 750 [IU]/h via INTRAVENOUS
  Administered 2015-04-29: 850 [IU]/h via INTRAVENOUS
  Administered 2015-05-01: 1150 [IU]/h via INTRAVENOUS
  Filled 2015-04-27 (×8): qty 250

## 2015-04-27 MED ORDER — FENTANYL CITRATE (PF) 100 MCG/2ML IJ SOLN
100.0000 ug | INTRAMUSCULAR | Status: DC | PRN
  Administered 2015-04-27: 100 ug via INTRAVENOUS
  Filled 2015-04-27: qty 2

## 2015-04-27 MED ORDER — PROPOFOL 1000 MG/100ML IV EMUL
0.0000 ug/kg/min | INTRAVENOUS | Status: DC
  Administered 2015-04-27: 50 ug/kg/min via INTRAVENOUS
  Administered 2015-04-27: 5 ug/kg/min via INTRAVENOUS
  Administered 2015-04-27: 50 ug/kg/min via INTRAVENOUS
  Administered 2015-04-28: 15 ug/kg/min via INTRAVENOUS
  Filled 2015-04-27 (×2): qty 100

## 2015-04-27 MED ORDER — NITROGLYCERIN IN D5W 200-5 MCG/ML-% IV SOLN
100.0000 ug/min | Freq: Once | INTRAVENOUS | Status: AC
  Administered 2015-04-27: 100 ug/min via INTRAVENOUS

## 2015-04-27 MED ORDER — SUCCINYLCHOLINE CHLORIDE 20 MG/ML IJ SOLN
INTRAMUSCULAR | Status: DC | PRN
  Administered 2015-04-27: 100 mg via INTRAVENOUS

## 2015-04-27 NOTE — Progress Notes (Signed)
eLink Physician-Brief Progress Note Patient Name: Paige Aguilar DOB: 01-Mar-1986 MRN: [id number]   Date of Service  04/27/2015  HPI/Events of Note    eICU Interventions  CXR reviewed for CVL placement> OK to use     Intervention Category Minor Interventions: Routine modifications to care plan (e.g. PRN medications for pain, fever)  MCQUAID, DOUGLAS 04/27/2015, 5:06 PM

## 2015-04-27 NOTE — Care Management Note (Signed)
Case Management Note  Patient Details  Name: Paige Aguilar MRN: [id number] Date of Birth: [birth year]  Subjective/Objective:      Adm w resp distress, vent              Action/Plan: had been on train ride from [place]   Expected Discharge Date:                  Expected Discharge Plan:     In-House Referral:     Discharge planning Services     Post Acute Care Choice:    Choice offered to:     DME Arranged:    DME Agency:     HH Arranged:    HH Agency:     Status of Service:     Medicare Important Message Given:    Date Medicare IM Given:    Medicare IM give by:    Date Additional Medicare IM Given:    Additional Medicare Important Message give by:     If discussed at Long Length of Stay Meetings, dates discussed:    Additional Comments: ur review done  Hanley Hays, RN 04/27/2015, 1:50 PM

## 2015-04-27 NOTE — Procedures (Addendum)
Central Venous Catheter Insertion Procedure Note Paige Aguilar [id number] 02-Jun-1986  Procedure: Insertion of Central Venous Catheter Indications: Assessment of intravascular volume and Drug and/or fluid administration  Procedure Details Consent: Risks of procedure as well as the alternatives and risks of each were explained to the (patient/caregiver).  Consent for procedure obtained. Time Out: Verified patient identification, verified procedure, site/side was marked, verified correct patient position, special equipment/implants available, medications/allergies/relevent history reviewed, required imaging and test results available.  Performed  Maximum sterile technique was used including antiseptics, cap, gloves, gown, hand hygiene, mask and sheet. Skin prep: Chlorhexidine; local anesthetic administered A antimicrobial bonded/coated triple lumen catheter was placed in the right internal jugular vein using the Seldinger technique.  Sutured at 16 cm.  Evaluation Blood flow good Complications: No apparent complications Patient did tolerate procedure well. Chest X-ray ordered to verify placement.  CXR: pending.  Bryen Hinderman 04/27/2015, 4:28 PM

## 2015-04-27 NOTE — ED Notes (Signed)
Pt starting to desat into the 80's. RN attempted to place Nonrebreather. Pt refusing nonrebreather and taking off mask. PA at bedside explaining to patient the importance of the mask. Pt placed on nonrebreather and RT notified to place pt on BIPAP.

## 2015-04-27 NOTE — Progress Notes (Signed)
ANTICOAGULATION CONSULT NOTE - Initial Consult  Pharmacy Consult:  Heparin Indication: pulmonary embolus  Allergies  Allergen Reactions  . Amoxicillin Rash  . Sulfa Antibiotics Rash    Patient Measurements: Weight: 146 lb 6.2 oz (66.4 kg) Heparin Dosing Weight: 60 kg  Vital Signs: Temp: 98.3 F (36.8 C) (08/03 1042) Temp Source: Oral (08/03 1042) BP: 160/112 mmHg (08/03 1300) Pulse Rate: 39 (08/03 1225)  Labs:  Recent Labs  04/27/15 1109 04/27/15 1147  HGB 8.2* 9.2*  HCT 26.1* 27.0*  PLT 488*  --   CREATININE 3.09* 3.00*    Estimated Creatinine Clearance: 22.7 mL/min (by C-G formula based on Cr of 3).   Medical History: Past Medical History  Diagnosis Date  . Lupus   . Arthritis   . Thyroid disease   . Diabetes mellitus without complication       Assessment: 28 YOF with history of lupus and DVT noncompliant with anticoagulation.  Patient presents with SOB s/p train ride back to [place] from [place].  D-dimer is elevated and unable to scan for PE due to AKI/CKD.  Pharmacy consulted to initiate IV heparin for presumed PE.  Baseline labs reviewed.   Goal of Therapy:  Heparin level 0.3-0.7 units/ml Monitor platelets by anticoagulation protocol: Yes    Plan:  - Heparin 2500 units IV bolus x 1, then - Heparin gtt at 800 units/hr - Check 6 hr HL - Daily HL / CBC    Edahi Kroening D. Laney Potash, PharmD, BCPS Pager:  [phone] 04/27/2015, 1:35 PM

## 2015-04-27 NOTE — ED Notes (Signed)
Earring and bracelets removed from patient and given to family member

## 2015-04-27 NOTE — ED Notes (Signed)
IV team at bedside. Pt pulled on ultrasound IV.

## 2015-04-27 NOTE — Progress Notes (Signed)
CRITICAL VALUE ALERT  Critical value received: troponin 1.21  Date of notification:  04/27/2015  Time of notification:  1911  Critical value read back:Yes.    Nurse who received alert:  Malachi Pro RN    MD notified (1st page):  Dr. Kendrick Fries  Time of first page:  1913  Responding MD:  Dr. Kendrick Fries   Time MD responded:  [phone]

## 2015-04-27 NOTE — Progress Notes (Signed)
Echocardiogram 2D Echocardiogram has been performed.  Paige Aguilar 04/27/2015, 12:16 PM

## 2015-04-27 NOTE — ED Notes (Signed)
Pt requesting help with getting up to use the bathroom. RN explained to pt the importance of using a bedpan due to her increased work of breathing. Pt took out N/C and continued to get out of bed. Pt helped back into bed by 2 RNs and placed back on N/C. Pt still refusing nonrebreather at this time.

## 2015-04-27 NOTE — Procedures (Signed)
Arterial Catheter Insertion Procedure Note Paige Aguilar [id number] [birth year]  Procedure: Insertion of Arterial Catheter  Indications: Blood pressure monitoring and Frequent blood sampling  Procedure Details Consent: Risks of procedure as well as the alternatives and risks of each were explained to the (patient/caregiver).  Consent for procedure obtained. Time Out: Verified patient identification, verified procedure, site/side was marked, verified correct patient position, special equipment/implants available, medications/allergies/relevent history reviewed, required imaging and test results available.  Performed  Maximum sterile technique was used including antiseptics, cap, gloves, gown, hand hygiene, mask and sheet. Skin prep: Chlorhexidine; local anesthetic administered 20 gauge catheter was inserted into left radial artery using the Seldinger technique.  Evaluation Blood flow good; BP tracing good. Complications: No apparent complications.   Nelda Bucks 04/27/2015  Mcarthur Rossetti. Tyson Alias, MD, FACP Pgr: [phone] [facility]

## 2015-04-27 NOTE — Progress Notes (Signed)
Patient was intubated in the ED and transported to 2 Heart room 13.  Patient had frothy, pink secretions present in HME and inline suction.  There were no noted complications during transport and patient was not in distress. Patient is currently resting on PRVC 400/16/+8/100%. RT will continue to monitor patient.

## 2015-04-27 NOTE — Progress Notes (Signed)
ANTICOAGULATION CONSULT NOTE - Follow-Up Consult  Pharmacy Consult:  Heparin Indication: pulmonary embolus  Allergies  Allergen Reactions  . Amoxicillin Rash  . Sulfa Antibiotics Rash    Patient Measurements: Weight: 163 lb 2.3 oz (74 kg) Heparin Dosing Weight: 60 kg  Vital Signs: Temp: 99.1 F (37.3 C) (08/03 2000) Temp Source: Oral (08/03 2000) BP: 132/83 mmHg (08/03 1800) Pulse Rate: 124 (08/03 1700)  Labs:  Recent Labs  04/27/15 1109 04/27/15 1147 04/27/15 1800 04/27/15 2100  HGB 8.2* 9.2*  --   --   HCT 26.1* 27.0*  --   --   PLT 488*  --   --   --   HEPARINUNFRC  --   --   --  0.22*  CREATININE 3.09* 3.00*  --   --   TROPONINI  --   --  1.21*  --     Estimated Creatinine Clearance: 24 mL/min (by C-G formula based on Cr of 3).   Medical History: Past Medical History  Diagnosis Date  . Lupus   . Arthritis   . Thyroid disease   . Diabetes mellitus without complication      Assessment: 29yo female with history of lupus and DVT, noncompliant with anticoagulation.  Patient presents with SOB s/p train ride back to [place] from [place].  D-dimer is elevated and unable to scan for PE due to AKI/CKD. Trop elevated at  1.21, suspect PE.  Pharmacy consulted to dose IV heparin for presumed PE.  Baseline labs reviewed.  Heparin level subtherapeutic at 0.22 after initial bolus and gtt running at 800 units/hr.  No bleeding reported, no issues per RN.   Goal of Therapy:  Heparin level 0.3-0.7 units/ml Monitor platelets by anticoagulation protocol: Yes    Plan:  - Heparin 1000 units IV bolus x 1 - Increase heparin gtt to 900 units/hr - Check 6 hr HL - Monitor daily HL, CBC, s/sx of bleeding  Waynette Buttery, PharmD Clinical Pharmacist Pager: [phone] 04/27/2015 10:13 PM

## 2015-04-27 NOTE — ED Notes (Signed)
Pt complaining of SOB that started about 2 days ago. SOB has been getting progressively worse since her new [place] train ride. Pt is A/O. Pt has a history of lupus and DVT. Pts caregiver reports pt has a history of blood clots and was supposed to be on blood thinners, but never got the prescription.

## 2015-04-27 NOTE — H&P (Addendum)
PULMONARY / CRITICAL CARE MEDICINE   Name: Paige Aguilar MRN: [id number] DOB: 1985-12-17    ADMISSION DATE:  04/27/2015 CONSULTATION DATE:  04/27/15  REFERRING MD :  Dr. Jeraldine Loots  CHIEF COMPLAINT:  Acute Respiratory Failure   INITIAL PRESENTATION: 29 y/o F with PMH of lupus, blood clots (supposed to be on anticoagulation but recently stopped per family), DM, hypothyroidism, who presented to [facility] on 8/3 after a train ride from [place] to [place] with a 2 day history of SOB.  Patient desaturated into the 80's and was placed on BiPAP.  She failed BiPAP and was emergently intubated.  PCCM called for admission.   STUDIES:  8/03  CTA Chest >>   SIGNIFICANT EVENTS: 8/03  Admit with SOB.  Decompensated in ER, failed bipap.  Intubated.  Bloody secretions from ETT.     HISTORY OF PRESENT ILLNESS:  29 y/o F with PMH of lupus, blood clots - PE/DVT hx (supposed to be on anticoagulation but did not take), AICD, DM, hypothyroidism, who presented to [facility] on 8/3 after a train ride from [place] to  with a 2 day history of SOB.  Patient desaturated into the 80's and was placed on BiPAP.  She failed BiPAP and was emergently intubated.    On arrival to the ER, the patient reported she was supposed to be on blood thinners for DVT but she did not fill the prescription.  She developed SOB approximately 48 hours prior to presentation.  Reportedly, she rode a train from [place] to [place].  The patient was treated in the ER with NTG gtt for hypertension / tachycardia.  She unfortunately failed bipap and was intubated in the ER.  Labs notable for Na 143, K 3.6, Cl 113, sr Cr 3.09, troponin 0.04, WBC 15.4, Hgb 8.2 , & platelets 488.  CXR consistent with pulmonary edema / CHF, no overt infiltrate.    Family reports the patient went to [place] approx 2 weeks pta.  She began feeling poorly with SOB and was diagnosed with DVT's.  The patient did not fill the prescription.  The patient arrived last PM (8/2) and family noted she was more SOB and had  increased lower extremity swelling.    PCCM consulted for ICU admit.    PAST MEDICAL HISTORY :   has a past medical history of Lupus; Arthritis; Thyroid disease; and Diabetes mellitus without complication.  has past surgical history that includes Pacemaker insertion and Cesarean section.   Prior to Admission medications   Medication Sig Start Date End Date Taking? Authorizing Provider  cloNIDine (CATAPRES) 0.1 MG tablet Take 0.1 mg by mouth 2 (two) times daily.    Historical Provider, MD  FLUoxetine (PROZAC) 10 MG capsule Take 10 mg by mouth 2 (two) times daily.    Historical Provider, MD  insulin aspart (NOVOLOG) 100 UNIT/ML injection Inject 10 Units into the skin daily as needed for high blood sugar.     Historical Provider, MD  oxycodone (ROXICODONE) 30 MG immediate release tablet Take 30 mg by mouth every 4 (four) hours as needed for pain.    Historical Provider, MD  oxyCODONE-acetaminophen (PERCOCET) 10-325 MG per tablet Take 1 tablet by mouth every 4 (four) hours as needed for pain.    Historical Provider, MD  predniSONE (DELTASONE) 10 MG tablet Take 10 mg by mouth daily with breakfast.    Historical Provider, MD  QUEtiapine (SEROQUEL) 50 MG tablet Take 50 mg by mouth at bedtime.    Historical Provider, MD  Allergies  Allergen Reactions  . Amoxicillin Rash  . Sulfa Antibiotics Rash    FAMILY HISTORY:  has no family status information on file.    SOCIAL HISTORY:  reports that she has never smoked. She does not have any smokeless tobacco history on file. She reports that she does not drink alcohol or use illicit drugs.  REVIEW OF SYSTEMS:  Unable to complete as patient is altered on vent.    SUBJECTIVE: tachy, HTN  VITAL SIGNS: Temp:  [98.3 F (36.8 C)] 98.3 F (36.8 C) (08/03 1042) Pulse Rate:  [39-148] 39 (08/03 1225) Resp:  [18-61] 18 (08/03 1231) BP: (154-206)/(107-131) 167/119 mmHg (08/03 1231) SpO2:  [64 %-96 %] 96 % (08/03 1231) FiO2 (%):  [100 %] 100 % (08/03  1231)   HEMODYNAMICS:     VENTILATOR SETTINGS: Vent Mode:  [-] PRVC FiO2 (%):  [100 %] 100 % Set Rate:  [20 bmp] 20 bmp Vt Set:  [400 mL] 400 mL PEEP:  [5 cmH20-8 cmH20] 8 cmH20 Plateau Pressure:  [35 cmH20] 35 cmH20   INTAKE / OUTPUT: No intake or output data in the 24 hours ending 04/27/15 1243  PHYSICAL EXAMINATION: General:  Obese female in NAD on vent  Neuro:  Sedate, moves ext's spontaneously HEENT:  OETT, MM pink/moist, bloody secretions from ETT  Cardiovascular:  s1s2 tachy, distant tones due to adventitious breath sounds Lungs:  resp's even/non-labored, lungs bilaterally coarse rhonchi  Abdomen:  Obese / soft, bsx4 active  Musculoskeletal:  No acute deformities  Skin:  Warm/dry, 2+ pitting edema BLE   LABS:  CBC  Recent Labs Lab 04/27/15 1109 04/27/15 1147  WBC 15.4*  --   HGB 8.2* 9.2*  HCT 26.1* 27.0*  PLT 488*  --    Coag's No results for input(s): APTT, INR in the last 168 hours.   BMET  Recent Labs Lab 04/27/15 1109 04/27/15 1147  NA 143 145  K 3.6 3.5  CL 113* 113*  CO2 19*  --   BUN 50* 47*  CREATININE 3.09* 3.00*  GLUCOSE 95 93   Electrolytes  Recent Labs Lab 04/27/15 1109  CALCIUM 8.5*   Sepsis Markers No results for input(s): LATICACIDVEN, PROCALCITON, O2SATVEN in the last 168 hours.   ABG No results for input(s): PHART, PCO2ART, PO2ART in the last 168 hours.   Liver Enzymes No results for input(s): AST, ALT, ALKPHOS, BILITOT, ALBUMIN in the last 168 hours.   Cardiac Enzymes No results for input(s): TROPONINI, PROBNP in the last 168 hours.   Glucose No results for input(s): GLUCAP in the last 168 hours.  Imaging Dg Chest Port 1 View  04/27/2015   CLINICAL DATA:  Short of breath.  Lupus.  Diabetes.  EXAM: PORTABLE CHEST - 1 VIEW  COMPARISON:  01/20/2015  FINDINGS: Cardiac enlargement. Bilateral airspace disease consistent with pulmonary edema has progressed from the prior study. Small right effusion is unchanged. Mild  right lower lobe atelectasis unchanged.  AICD in unchanged position.  Congestive heart failure with edema.  IMPRESSION: No active disease.   Electronically Signed   By: Marlan Palau M.D.   On: 04/27/2015 11:18     ASSESSMENT / PLAN:  PULMONARY OETT 8/3 >>  A: Acute Hypoxemic Respiratory Failure - suspect in setting of PE given hx of medical non-compliance with anticoagulation ARDS Patchy infiltrates P:   Unable to scan for PE due to AKI/CKD, unless repeat abg shows continued refractory hypoxia / continued worsening vent needs, then would call IR to  do low low low volume contrast angio then EKOS if indicated Empiric Heparin gtt per pharmacy  Intermittent CXR  Assess LE Doppler  Echo for RV Daily SBT / WUA  Follow up ABG, assess A-a gradient PRVC 8cc/kg, rate 16 Control afterload  CARDIOVASCULAR CVL A:  Tachycardia  Hypertensive Urgency  P:  Assess ECHO for RV fxn now D/C NTG gtt  Propofol for sedation will hopefull help with SBP, Goal 140-160 SBP Lasix 60 mg IV x1 Resume clonidine, 0.1 mg BID, avoid rebound Assess troponin  Needs cvp  RENAL A:   CKD Assumed lupus nephropathy P:   Ensure BP control  Trend BMP / UOP  Replace electrolytes as indicated   GASTROINTESTINAL A:   Morbid Obesity  P:   NPO  OGT Consider TF in am if not extubated PPI for SUP  HEMATOLOGIC A:   Anemia - appears chronic, baseline ~ 8.7 DVT / Suspected PE Lupus - on 10 mg prednisone QD P:  Trend CBC Heparin gtt per pharmacy   Monitor for bleeding   INFECTIOUS A:    No acute infectious process  P:   Monitor fever curve / WBC  ENDOCRINE A:   DM  ?? Hypothyroidism - noted on hx but not on any meds P:   SSI  Check TSH  NEUROLOGIC A:   Agitation  P:   RASS goal: -1 Propofol gtt for sedation  PRN fentanyl    FAMILY  - Updates: Updated at bedside.    Canary Brim, NP-C [facility] Pgr: [phone] or if no answer [phone] 04/27/2015, 1:14  PM    STAFF NOTE: I, Rory Percy, MD FACP have personally reviewed patient's available data, including medical history, events of note, physical examination and test results as part of my evaluation. I have discussed with resident/NP and other care providers such as pharmacist, RN and RRT. In addition, I personally evaluated patient and elicited key findings of: SOB, high clinical suspicion PE, echo just back moderate reduction, LA re assuring, likely has PE, just unsure of contribution to resp failure, if unable to oxygenate would then push to IR for angio / ekos if indicated , reluctant given renal fxn, concerned about HTN crisis afterload as cause resp failure, patchy edema on pcxr, abg now, assess A-a gradient, empiric heparin for now, prop, add fent, rass goal -3, control BP, goal MAP 25% reduction highest MAP, I updated family in room, lasix likely needed, get line / cvp, HTn is an issue, need to control afterload better, avoid ntg with preload reduction in setting likley pe, use labetolol to 25% reduction The patient is critically ill with multiple organ systems failure and requires high complexity decision making for assessment and support, frequent evaluation and titration of therapies, application of advanced monitoring technologies and extensive interpretation of multiple databases.   Critical Care Time devoted to patient care services described in this note is 30 Minutes. This time reflects time of care of this signee: Rory Percy, MD FACP. This critical care time does not reflect procedure time, or teaching time or supervisory time of PA/NP/Med student/Med Resident etc but could involve care discussion time. Rest per NP/medical resident whose note is outlined above and that I agree with   Mcarthur Rossetti. Tyson Alias, MD, FACP Pgr: [phone] [facility] 04/27/2015 2:32 PM '

## 2015-04-27 NOTE — ED Notes (Signed)
Pt placed on BIPAP by RT

## 2015-04-27 NOTE — Progress Notes (Signed)
eLink Physician-Brief Progress Note Patient Name: Paige Aguilar DOB: [birth year] MRN: [id number]   Date of Service  04/27/2015  HPI/Events of Note  Troponin positive, intubated, chart reviewed; pulm edema, suspect PE; has cardiomegally and ICD/dual chamber pacer on CXR  eICU Interventions  Add aspirin, continue heparin     Intervention Category Intermediate Interventions: Diagnostic test evaluation  MCQUAID, DOUGLAS 04/27/2015, 7:26 PM

## 2015-04-27 NOTE — Progress Notes (Addendum)
Responded to page provide support to patient family. Patient was brought to ED by her cousin who said that patient had just arrived from [place] yesterday by train and was experiencing SOB and swollen legs. Per patient cousin patient has a history of blood clots in legs and was hospitalized in [place] two weeks ago. 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 Supported family by empathetic listening, prayer and emotional and spiritual guidance. Patient is currently intubated and going to 2H13.  Escorted family to unit waiting area. Will pass on to unit chaplain for continued support.    04/27/15 1300  Clinical Encounter Type  Visited With Patient;Family;Health care provider  Visit Type Initial;Spiritual support;Critical Care;ED;Trauma  Referral From Nurse  Spiritual Encounters  Spiritual Needs Prayer;Emotional  Stress Factors  Family Stress Factors Exhausted;Family relationships;Health changes  Fae Pippin ,pager [phone]

## 2015-04-27 NOTE — ED Notes (Signed)
Pt continually trying to grab ETT tube. This RN explained to pt that she could not do this.

## 2015-04-27 NOTE — ED Provider Notes (Signed)
CSN: [id number]     Arrival date & time 04/27/15  1030 History   First MD Initiated Contact with Patient 04/27/15 1034     Chief Complaint  Patient presents with  . Shortness of Breath     (Consider location/radiation/quality/duration/timing/severity/associated sxs/prior Treatment) The history is provided by a relative and medical records.     Social 29 year old female with a past medical history Lupus, who presents emergency Department with respiratory distress. She has a past medical history of chronic kidney disease, lupus associated nephrosis, history of pulmonary embolus, status post AICD placeMENT. The patient history taken from her EMR, as well as her first cousin who attends her today. There is a level V caveat due to respiratory distress. She presents in respiratory distress after returning home last night from a train ride from [place]. The patient is currently not on any anticoagulants per the family member because of some association with her kidney disease. She states that she felt poorly last night when she woke up this morning, had respiratory distress. Patient has not had any fevers. She has a history of asthma or severe respiratory problems. She does not smoke.  Past Medical History  Diagnosis Date  . Lupus   . Arthritis   . Thyroid disease   . Diabetes mellitus without complication    Past Surgical History  Procedure Laterality Date  . Pacemaker insertion    . Cesarean section     No family history on file. History  Substance Use Topics  . Smoking status: Never Smoker   . Smokeless tobacco: Not on file  . Alcohol Use: No   OB History    No data available     Review of Systems  Unable to review due to acuity of condition (respiratory distress.)   Allergies  Amoxicillin and Sulfa antibiotics  Home Medications   Prior to Admission medications   Medication Sig Start Date End Date Taking? Authorizing Provider  cloNIDine (CATAPRES) 0.1 MG tablet Take  0.1 mg by mouth 2 (two) times daily.    Historical Provider, MD  FLUoxetine (PROZAC) 10 MG capsule Take 10 mg by mouth 2 (two) times daily.    Historical Provider, MD  insulin aspart (NOVOLOG) 100 UNIT/ML injection Inject 10 Units into the skin daily as needed for high blood sugar.     Historical Provider, MD  oxycodone (ROXICODONE) 30 MG immediate release tablet Take 30 mg by mouth every 4 (four) hours as needed for pain.    Historical Provider, MD  oxyCODONE-acetaminophen (PERCOCET) 10-325 MG per tablet Take 1 tablet by mouth every 4 (four) hours as needed for pain.    Historical Provider, MD  predniSONE (DELTASONE) 10 MG tablet Take 10 mg by mouth daily with breakfast.    Historical Provider, MD  QUEtiapine (SEROQUEL) 50 MG tablet Take 50 mg by mouth at bedtime.    Historical Provider, MD   BP 182/120 mmHg  Pulse 146  Temp(Src) 98.3 F (36.8 C) (Oral)  Resp 54  SpO2 94%  LMP  (Within Days) Physical Exam  Constitutional: She is oriented to person, place, and time. She appears well-developed and well-nourished.  HENT:  Head: Normocephalic and atraumatic.  Moon facies  Eyes: Conjunctivae are normal.  Neck: Normal range of motion. No JVD present.  Cardiovascular: Intact distal pulses.   Tachycardic, unable to auscultate heart sounds due to noise of breathing  Pulmonary/Chest: She is in respiratory distress. She has no wheezes. She has rales.  Patient in acute respiratory  distress, respirations up to 54 with poor air movement, crackles and rales present. Patient is coughing up foamy white sputum  Abdominal: Soft. There is no tenderness.  Musculoskeletal: Normal range of motion.  Neurological: She is alert and oriented to person, place, and time.  Skin: She is diaphoretic.  Nursing note and vitals reviewed.   ED Course  CRITICAL CARE Performed by: Arthor Captain Authorized by: Arthor Captain Total critical care time: 75 minutes Critical care time was exclusive of separately  billable procedures and treating other patients and teaching time. Critical care was necessary to treat or prevent imminent or life-threatening deterioration of the following conditions: respiratory failure. Critical care was time spent personally by me on the following activities: review of old charts, re-evaluation of patient's condition, ordering and review of radiographic studies, ordering and review of laboratory studies, ordering and performing treatments and interventions, obtaining history from patient or surrogate, examination of patient and evaluation of patient's response to treatment.   (including critical care time) Labs Review Labs Reviewed  BASIC METABOLIC PANEL - Abnormal; Notable for the following:    Chloride 113 (*)    CO2 19 (*)    BUN 50 (*)    Creatinine, Ser 3.09 (*)    Calcium 8.5 (*)    GFR calc non Af Amer 19 (*)    GFR calc Af Amer 22 (*)    All other components within normal limits  CBC WITH DIFFERENTIAL/PLATELET - Abnormal; Notable for the following:    WBC 15.4 (*)    RBC 2.85 (*)    Hemoglobin 8.2 (*)    HCT 26.1 (*)    Platelets 488 (*)    Neutrophils Relative % 78 (*)    Neutro Abs 12.1 (*)    All other components within normal limits  BRAIN NATRIURETIC PEPTIDE - Abnormal; Notable for the following:    B Natriuretic Peptide 3778.7 (*)    All other components within normal limits  D-DIMER, QUANTITATIVE (NOT AT [facility]) - Abnormal; Notable for the following:    D-Dimer, Quant 3.31 (*)    All other components within normal limits  I-STAT CHEM 8, ED - Abnormal; Notable for the following:    Chloride 113 (*)    BUN 47 (*)    Creatinine, Ser 3.00 (*)    Hemoglobin 9.2 (*)    HCT 27.0 (*)    All other components within normal limits  I-STAT VENOUS BLOOD GAS, ED - Abnormal; Notable for the following:    pH, Ven 7.330 (*)    pCO2, Ven 38.8 (*)    pO2, Ven 18.0 (*)    Acid-base deficit 5.0 (*)    All other components within normal limits  MRSA PCR  SCREENING  TROPONIN I  TROPONIN I  LACTIC ACID, PLASMA  TRIGLYCERIDES  TSH  HEPARIN LEVEL (UNFRACTIONATED)  I-STAT TROPOININ, ED  I-STAT ARTERIAL BLOOD GAS, ED  I-STAT BETA HCG BLOOD, ED ([facility], [facility], [facility] ONLY)    Imaging Review No results found.   EKG Interpretation None      MDM   Final diagnoses:  Encounter for intravenous line placement    Patient here in respiratory distress. 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 Patient also has a potential for pericardial effusion or tamponade. She has a history of AICD placement. Last Echo showed EF of 50-55%. Workup is pending. Oxygen saturations in the low 90s on 10 L  via nasal cannula.   Patient chest x-ray shows marked. Pulmonary edema, her heart shadow appears abnormally large. Initial concern for pericardial effusion. Patient's creatinine is at 3 and she is not likely stable enough for CT angiogram for chest to rule out PE nor is her kidney function can if to likely tolerate any IV contrast. I will hold Lasix at this time as well as her BUN and creatinine aren't elevated. Patient's respiratory distress not improving. I have ordered BiPAP on the patient.    Patient refusing BiPAP at this time. She states that she is claustrophobic. I have ordered an ABG and will wait for results.    Patient received VBG. Her pH appears up only mildly decreased. Acutely hypoxemic. Upon reevaluation, she continues to be in marked respiratory distress. Breathing greater than 50, breast per minute, and patient is becoming diaphoretic. I have convinced her that at this time, BiPAP is appropriate versus intubation. Patient states that she will take BiPAP. I have ordered a half milligram of Ativan to calm her down.  Patient given Presley 15 minutes of BiPAP without any improvement in her breathing. Decision to intubate the patient made at that time. Patient consents to  procedure at hand. Please refer to Dr. Priscille Loveless notes for intubation procedure on the patient. Patient also given a bolus of nitroglycerin as well as started on infusion. Labs reviewed and show elevated white blood cell count, hemoglobin 8.2. She has an elevated BNP. The patient admitted to critical care for acute respiratory distress, markedly pulmonary edema. Suspected pulmonary embolus. Review of her echocardiogram shows severe hypokinesis of the left ventricle with EF of 30%, which is a markedly decreased from her previous. Patient seen in shared visit with Dr. Forbes Cellar, PA-C 04/27/15 1617  Gerhard Munch, MD 04/29/15 [id number]

## 2015-04-27 NOTE — ED Provider Notes (Signed)
Emergency Ultrasound Study:   Angiocath insertion Performed by: Enid Skeens  Consent: Verbal consent obtained. Risks and benefits: risks, benefits and alternatives were discussed Immediately prior to procedure the correct patient, procedure, equipment, support staff and site/side marked as needed.  Indication: difficult IV access Preparation: Patient was prepped and draped in the usual sterile fashion. Vein Location: right basilic vein was visualized during assessment for potential access sites and was found to be patent/ easily compressed with linear ultrasound.  The needle was visualized with real-time ultrasound and guided into the vein. Gauge: 20 g  Image saved and stored.  Normal blood return.  Patient tolerance: Patient tolerated the procedure well with no immediate complications.      Blane Ohara, MD 04/27/15 1228

## 2015-04-27 NOTE — Code Documentation (Signed)
MD Jeraldine Loots at bedside with Respiratory to prepare to intubate.

## 2015-04-28 ENCOUNTER — Ambulatory Visit (HOSPITAL_COMMUNITY): Payer: Medicaid Other

## 2015-04-28 ENCOUNTER — Inpatient Hospital Stay (HOSPITAL_COMMUNITY): Payer: Medicaid Other

## 2015-04-28 LAB — POCT I-STAT 3, ART BLOOD GAS (G3+)
Acid-base deficit: 1 mmol/L (ref 0.0–2.0)
Bicarbonate: 23.8 mEq/L (ref 20.0–24.0)
O2 SAT: 100 %
PH ART: 7.396 (ref 7.350–7.450)
TCO2: 25 mmol/L (ref 0–100)
pCO2 arterial: 38.7 mmHg (ref 35.0–45.0)
pO2, Arterial: 421 mmHg — ABNORMAL HIGH (ref 80.0–100.0)

## 2015-04-28 LAB — CBC
HCT: 23.6 % — ABNORMAL LOW (ref 36.0–46.0)
Hemoglobin: 7.5 g/dL — ABNORMAL LOW (ref 12.0–15.0)
MCH: 29 pg (ref 26.0–34.0)
MCHC: 31.8 g/dL (ref 30.0–36.0)
MCV: 91.1 fL (ref 78.0–100.0)
PLATELETS: 455 10*3/uL — AB (ref 150–400)
RBC: 2.59 MIL/uL — AB (ref 3.87–5.11)
RDW: 15.6 % — ABNORMAL HIGH (ref 11.5–15.5)
WBC: 12.8 10*3/uL — ABNORMAL HIGH (ref 4.0–10.5)

## 2015-04-28 LAB — BASIC METABOLIC PANEL
Anion gap: 8 (ref 5–15)
Anion gap: 12 (ref 5–15)
BUN: 56 mg/dL — AB (ref 6–20)
BUN: 60 mg/dL — AB (ref 6–20)
CALCIUM: 8.1 mg/dL — AB (ref 8.9–10.3)
CHLORIDE: 115 mmol/L — AB (ref 101–111)
CO2: 21 mmol/L — ABNORMAL LOW (ref 22–32)
CO2: 18 mmol/L — ABNORMAL LOW (ref 22–32)
CREATININE: 3.76 mg/dL — AB (ref 0.44–1.00)
Calcium: 8.3 mg/dL — ABNORMAL LOW (ref 8.9–10.3)
Chloride: 110 mmol/L (ref 101–111)
Creatinine, Ser: 3.43 mg/dL — ABNORMAL HIGH (ref 0.44–1.00)
GFR calc Af Amer: 20 mL/min — ABNORMAL LOW (ref >60)
GFR calc Af Amer: 17 mL/min — ABNORMAL LOW (ref >60)
GFR calc non Af Amer: 17 mL/min — ABNORMAL LOW (ref >60)
GFR calc non Af Amer: 15 mL/min — ABNORMAL LOW (ref >60)
GLUCOSE: 105 mg/dL — AB (ref 65–99)
GLUCOSE: 206 mg/dL — AB (ref 65–99)
POTASSIUM: 5.5 mmol/L — AB (ref 3.5–5.1)
POTASSIUM: 4.6 mmol/L (ref 3.5–5.1)
SODIUM: 140 mmol/L (ref 135–145)
Sodium: 144 mmol/L (ref 135–145)

## 2015-04-28 LAB — GLUCOSE, CAPILLARY
GLUCOSE-CAPILLARY: 86 mg/dL (ref 65–99)
GLUCOSE-CAPILLARY: 107 mg/dL — AB (ref 65–99)
Glucose-Capillary: 123 mg/dL — ABNORMAL HIGH (ref 65–99)
Glucose-Capillary: 153 mg/dL — ABNORMAL HIGH (ref 65–99)
Glucose-Capillary: 95 mg/dL (ref 65–99)
Glucose-Capillary: 97 mg/dL (ref 65–99)

## 2015-04-28 LAB — PHOSPHORUS: Phosphorus: 6.4 mg/dL — ABNORMAL HIGH (ref 2.5–4.6)

## 2015-04-28 LAB — HEMOGLOBIN AND HEMATOCRIT, BLOOD
HCT: 22.7 % — ABNORMAL LOW (ref 36.0–46.0)
HEMOGLOBIN: 7.2 g/dL — AB (ref 12.0–15.0)

## 2015-04-28 LAB — TROPONIN I: Troponin I: 0.43 ng/mL — ABNORMAL HIGH (ref <0.031)

## 2015-04-28 LAB — HEPARIN LEVEL (UNFRACTIONATED)
HEPARIN UNFRACTIONATED: 0.67 [IU]/mL (ref 0.30–0.70)
HEPARIN UNFRACTIONATED: 0.24 [IU]/mL — AB (ref 0.30–0.70)
Heparin Unfractionated: 0.81 IU/mL — ABNORMAL HIGH (ref 0.30–0.70)

## 2015-04-28 LAB — MAGNESIUM: Magnesium: 1.8 mg/dL (ref 1.7–2.4)

## 2015-04-28 MED ORDER — INSULIN ASPART 100 UNIT/ML ~~LOC~~ SOLN
0.0000 [IU] | Freq: Three times a day (TID) | SUBCUTANEOUS | Status: DC
  Administered 2015-04-28 – 2015-04-29 (×2): 3 [IU] via SUBCUTANEOUS
  Administered 2015-04-29 – 2015-04-30 (×4): 2 [IU] via SUBCUTANEOUS
  Administered 2015-04-30: 5 [IU] via SUBCUTANEOUS
  Administered 2015-05-01: 3 [IU] via SUBCUTANEOUS
  Administered 2015-05-01: 2 [IU] via SUBCUTANEOUS

## 2015-04-28 MED ORDER — PANTOPRAZOLE SODIUM 40 MG PO TBEC
40.0000 mg | DELAYED_RELEASE_TABLET | Freq: Every day | ORAL | Status: DC
  Administered 2015-04-28 – 2015-05-02 (×5): 40 mg via ORAL
  Filled 2015-04-28 (×5): qty 1

## 2015-04-28 MED ORDER — MENTHOL 3 MG MT LOZG
1.0000 | LOZENGE | OROMUCOSAL | Status: DC | PRN
  Administered 2015-04-28: 3 mg via ORAL
  Filled 2015-04-28 (×2): qty 9

## 2015-04-28 MED ORDER — CETYLPYRIDINIUM CHLORIDE 0.05 % MT LIQD
7.0000 mL | Freq: Two times a day (BID) | OROMUCOSAL | Status: DC
  Administered 2015-04-28 – 2015-04-30 (×4): 7 mL via OROMUCOSAL

## 2015-04-28 MED ORDER — CLONIDINE HCL 0.1 MG PO TABS
0.1000 mg | ORAL_TABLET | Freq: Two times a day (BID) | ORAL | Status: DC
  Administered 2015-04-28 – 2015-04-30 (×4): 0.1 mg via ORAL
  Filled 2015-04-28 (×4): qty 1

## 2015-04-28 NOTE — Procedures (Signed)
Extubation Procedure Note  Patient Details:   Name: Paige Aguilar DOB: 01-07-1986 MRN: [id number]   Airway Documentation:     Evaluation  O2 sats: stable throughout Complications: No apparent complications Patient did tolerate procedure well. Bilateral Breath Sounds: Clear, Diminished Suctioning: Airway Yes   Pt extubated to a 4L [place]. Cuff leak noted. BS clear and diminished. Sat is currently 100 RR 15. PT tolerated well. RT will continue to monitor.  Darolyn Rua 04/28/2015, 10:34 AM

## 2015-04-28 NOTE — Progress Notes (Signed)
Arterial Line removed at this time per MD order. Pt developed small hematoma to left wrist. Manual pressure held for 10 minutes. Site dressed with pressure dressing. RN will continue to monitor.

## 2015-04-28 NOTE — Progress Notes (Signed)
PULMONARY / CRITICAL CARE MEDICINE   Name: Paige Aguilar MRN: [id number] DOB: 1986/03/22    ADMISSION DATE:  04/27/2015 CONSULTATION DATE:  04/27/15  REFERRING MD :  Dr. Jeraldine Loots  CHIEF COMPLAINT:  Acute Respiratory Failure   INITIAL PRESENTATION: 29 y/o F with PMH of lupus, blood clots (supposed to be on anticoagulation but recently stopped per family), DM, hypothyroidism, who presented to [facility] on 8/3 after a train ride from [place] to [place] with a 2 day history of SOB.  Patient desaturated into the 80's and was placed on BiPAP.  She failed BiPAP and was emergently intubated.  PCCM called for admission.   STUDIES:  TTE (04/27/15): LV moderately dilated with EF 25-30%. Diastolic function could not be assessed due to tachycardia. Moderate mitral and tricuspid regurgitation. LA mildly dilated. RV systolic function moderately reduced. IVC normal in size. Mild circumferential pericardial effusion.  Venous Duplex: Pending  SIGNIFICANT EVENTS: 8/03  Admit with SOB.  Decompensated in ER, failed bipap.  Intubated.  Bloody secretions from ETT.  8/04  Extubated   SUBJECTIVE:  No acute events overnight. Patient diuresed well with Lasix. Successfully extubated this morning. Denies any chest pain or pressure. Denies any dyspnea. Only minimal cough.  ROS: No nausea, vomiting, or abdominal pain. No headache.  VITAL SIGNS: Temp:  [97.3 F (36.3 C)-99.1 F (37.3 C)] 98.3 F (36.8 C) (08/04 1641) Pulse Rate:  [48-121] 48 (08/04 1813) Resp:  [0-29] 29 (08/04 1813) BP: (105-142)/(69-103) 130/98 mmHg (08/04 1813) SpO2:  [84 %-100 %] 84 % (08/04 1813) Arterial Line BP: (107-153)/(68-103) 150/100 mmHg (08/04 1500) FiO2 (%):  [40 %-100 %] 40 % (08/04 0930) Weight:  [156 lb 8.4 oz (71 kg)] 156 lb 8.4 oz (71 kg) (08/04 0400)   HEMODYNAMICS: CVP:  [5 mmHg-12 mmHg] 11 mmHg   VENTILATOR SETTINGS: Vent Mode:  [-] PSV;CPAP FiO2 (%):  [40 %-100 %] 40 % Set Rate:  [16 bmp] 16 bmp Vt Set:  [400 mL] 400 mL PEEP:   [0 cmH20-8 cmH20] 0 cmH20 Pressure Support:  [0 cmH20] 0 cmH20 Plateau Pressure:  [19 cmH20-26 cmH20] 19 cmH20   INTAKE / OUTPUT:  Intake/Output Summary (Last 24 hours) at 04/28/15 1859 Last data filed at 04/28/15 1800  Gross per 24 hour  Intake 1031.77 ml  Output   3940 ml  Net -2908.23 ml    PHYSICAL EXAMINATION: General:  Obese. No distress. Comfortable postextubation.  Integument:  Warm & dry. No rash on exposed skin. Right IJ central venous catheter in place. HEENT:  Moist mucus membranes. No oral ulcers. No scleral injection or icterus.  Cardiovascular:  Regular rate. No appreciable JVD.  Pulmonary:  Good aeration & clear to auscultation bilaterally. Normal work of breathing on nasal cannula oxygen. Abdomen: Soft. Normal bowel sounds. Nondistended. Grossly nontender. Neurological:  CN 2-12 grossly in tact. No meningismus. Moving all 4 extremities equally.   LABS:  CBC  Recent Labs Lab 04/27/15 1109 04/27/15 1147 04/28/15 0500 04/28/15 1430  WBC 15.4*  --  12.8*  --   HGB 8.2* 9.2* 7.5* 7.2*  HCT 26.1* 27.0* 23.6* 22.7*  PLT 488*  --  455*  --    Coag's No results for input(s): APTT, INR in the last 168 hours.   BMET  Recent Labs Lab 04/27/15 1109 04/27/15 1147 04/28/15 0500 04/28/15 1430  NA 143 145 144 140  K 3.6 3.5 5.5* 4.6  CL 113* 113* 115* 110  CO2 19*  --  21* 18*  BUN 50*  47* 56* 60*  CREATININE 3.09* 3.00* 3.43* 3.76*  GLUCOSE 95 93 105* 206*   Electrolytes  Recent Labs Lab 04/27/15 1109 04/28/15 0500 04/28/15 1430  CALCIUM 8.5* 8.3* 8.1*  MG  --  1.8  --   PHOS  --  6.4*  --    Sepsis Markers  Recent Labs Lab 04/27/15 1630  LATICACIDVEN 1.4     ABG  Recent Labs Lab 04/28/15 0338  PHART 7.396  PCO2ART 38.7  PO2ART 421.0*     Liver Enzymes No results for input(s): AST, ALT, ALKPHOS, BILITOT, ALBUMIN in the last 168 hours.   Cardiac Enzymes  Recent Labs Lab 04/27/15 1800 04/27/15 2350  TROPONINI 1.21* 0.43*      Glucose  Recent Labs Lab 04/27/15 1958 04/28/15 0007 04/28/15 0400 04/28/15 0737 04/28/15 1136 04/28/15 1647  GLUCAP 110* 97 95 86 123* 107*    Imaging Dg Chest Port 1 View  04/28/2015   CLINICAL DATA:  Respiratory failure.  Dyspnea.  EXAM: PORTABLE CHEST - 1 VIEW  COMPARISON:  04/27/2015  FINDINGS: The endotracheal tube is 3 cm above the carina. There is a right jugular central line extending into the cavoatrial junction. There is a nasogastric tube extending into the stomach. No pneumothorax. Central and basilar alveolar opacities persist. Small pleural effusions persist bilaterally.  IMPRESSION: Support equipment appears satisfactorily positioned.  Unchanged central and basilar edema or infiltrate, and pleural effusions. This likely represents congestive heart failure.   Electronically Signed   By: Ellery Plunk M.D.   On: 04/28/2015 06:56     ASSESSMENT / PLAN:  PULMONARY OETT 8/3 >> 8/4 A: Acute Hypoxemic Respiratory Failure - suspect in setting of PE given hx of medical non-compliance with anticoagulation Patchy infiltrates/pulmonary edema  P:   Continuing Lasix diuresis Continuing treatment for suspected acute PE with heparin drip Wean FiO2 for saturation greater than 94%  CARDIOVASCULAR CVL R IJ 8/3>> A:  Tachycardia  Hypertensive Urgency   P:  Continuing diuresis with Lasix IV every 12 hours Monitor patient on telemetry Continue clonidine twice a day  RENAL A:   CKD Assumed lupus nephropathy Hyperkalemia - resolved.  P:   Monitor urine output Trend renal function with BUN/creatinine daily Monitor electrolytes daily  GASTROINTESTINAL A:   Morbid Obesity   P:   Bedside swallow evaluation by nurse Carbohydrate consistent heart healthy diet if she passes a bedside swallow eval Protonix po daily  HEMATOLOGIC A:   Anemia - appears chronic, baseline ~ 8.7 DVT / Suspected PE Lupus - on 10 mg prednisone QD  P:  Trend hemoglobin per CBC  daily  Heparin gtt per pharmacy   Monitor for bleeding   INFECTIOUS A:    No acute infectious process   P:   Monitor for fever Trending leukocyte count daily with CBC  ENDOCRINE A:   DM  Chronic prednisone Questionable Hypothyroidism - noted on hx but not on any meds  P:   Stress dosed steroids with hydrocortisone 50 mg IV every 6 hours Continuing sliding scale algorithm Switching Accu-Cheks every before meals & at bedtime TSH normal  NEUROLOGIC A:   Agitation - resolved.  P:   Eliminate sedatives  Monitor closely    FAMILY  - Updates: No family present to update.    I spent a total of 40 minutes of critical care time today caring for this patient, updating the patient's extubation with respiratory therapy, in reviewing the patient's electronic medical record.   Donna Christen Jamison Neighbor, M.D.  [facility] Pager:  [phone] After 3pm or if no response, call [phone]  04/28/2015 6:59 PM '

## 2015-04-28 NOTE — Progress Notes (Signed)
ANTICOAGULATION CONSULT NOTE - Follow Up Consult  Pharmacy Consult for Heparin  Indication: Presumed PE  Allergies  Allergen Reactions  . Amoxicillin Rash  . Sulfa Antibiotics Rash   Patient Measurements: Weight: 156 lb 8.4 oz (71 kg)  Vital Signs: Temp: 97.4 F (36.3 C) (08/04 0400) Temp Source: Oral (08/04 0400) BP: 105/69 mmHg (08/04 0400) Pulse Rate: 88 (08/04 0100)  Labs:  Recent Labs  04/27/15 1109 04/27/15 1147 04/27/15 1800 04/27/15 2100 04/27/15 2350 04/28/15 0500  HGB 8.2* 9.2*  --   --   --   --   HCT 26.1* 27.0*  --   --   --   --   PLT 488*  --   --   --   --   --   HEPARINUNFRC  --   --   --  0.22*  --  0.67  CREATININE 3.09* 3.00*  --   --   --   --   TROPONINI  --   --  1.21*  --  0.43*  --     Estimated Creatinine Clearance: 23.5 mL/min (by C-G formula based on Cr of 3).   Assessment: Therapeutic heparin level after rate increase, ?diagnosed with DVTs 2 weeks ago in [place] but never filled Rx, presumed PE, labs reviewed.   Goal of Therapy:  Heparin level 0.3-0.7 units/ml Monitor platelets by anticoagulation protocol: Yes   Plan:  -Continue heparin at 900 units/hr -1200 HL -Daily CBC/HL -Monitor for bleeding  Abran Duke 04/28/2015,5:43 AM

## 2015-04-28 NOTE — Progress Notes (Signed)
100 mg Fentanyl wasted in the sink with Scarlette Calico, RN at this time.

## 2015-04-28 NOTE — Progress Notes (Signed)
CRITICAL VALUE ALERT  Critical value received:  Troponin 1.21  Date of notification:  04/27/2015  Time of notification:  1930  Critical value read back:yes  Nurse who received alert:  Anselm Lis  MD notified (1st page)  Time of first page:  1932  MD notified (2nd page):  Time of second page:  Responding MD: Vonna Drafts  Time MD responded:  [phone]

## 2015-04-28 NOTE — Progress Notes (Addendum)
ANTICOAGULATION CONSULT NOTE - Follow Up Consult  Pharmacy Consult for Heparin  Indication: Presumed PE  Allergies  Allergen Reactions  . Amoxicillin Rash  . Sulfa Antibiotics Rash   Patient Measurements: Weight: 156 lb 8.4 oz (71 kg)  Vital Signs: Temp: 98 F (36.7 C) (08/04 1135) Temp Source: Axillary (08/04 1135) BP: 141/90 mmHg (08/04 0913) Pulse Rate: 115 (08/04 1300)  Labs:  Recent Labs  04/27/15 1109 04/27/15 1147 04/27/15 1800 04/27/15 2100 04/27/15 2350 04/28/15 0500 04/28/15 1140  HGB 8.2* 9.2*  --   --   --  7.5*  --   HCT 26.1* 27.0*  --   --   --  23.6*  --   PLT 488*  --   --   --   --  455*  --   HEPARINUNFRC  --   --   --  0.22*  --  0.67 0.81*  CREATININE 3.09* 3.00*  --   --   --  3.43*  --   TROPONINI  --   --  1.21*  --  0.43*  --   --     Estimated Creatinine Clearance: 20.6 mL/min (by C-G formula based on Cr of 3.43).   Assessment: 29 yo female with suspected PE (noted with DVT about 2 weeks ago and history of  PE). She is on heparin and noted above goal (HL= 0.81). Hg noted as 7.5 (trend has been 8.2>>9.2>>7.5) and for a repeat CBC at 2pm today. SCr= 3.43 and anticipate plans for coumadin.   Goal of Therapy:  Heparin level 0.3-0.7 units/ml Monitor platelets by anticoagulation protocol: Yes   Plan:  -Decrease heparin to 750 units/hr -Will follow 2pm CBC -Heparin level at 9pm -Daily CBC/HL -Monitor for bleeding -Will follow anticoagulation plans  Harland German, Pharm D 04/28/2015 1:46 PM   ADDENDUM: HL is now subtherapeutic at 0.24 after decrease in rate. No issues noted.    Plan: Increase heparin gtt to 850 units/hr Check 8 hr HL Monitor daily HL, CBC, s/s of bleed

## 2015-04-29 ENCOUNTER — Ambulatory Visit (HOSPITAL_COMMUNITY): Payer: Medicaid Other

## 2015-04-29 DIAGNOSIS — I2699 Other pulmonary embolism without acute cor pulmonale: Secondary | ICD-10-CM

## 2015-04-29 DIAGNOSIS — F1310 Sedative, hypnotic or anxiolytic abuse, uncomplicated: Secondary | ICD-10-CM

## 2015-04-29 DIAGNOSIS — M329 Systemic lupus erythematosus, unspecified: Secondary | ICD-10-CM

## 2015-04-29 DIAGNOSIS — I8290 Acute embolism and thrombosis of unspecified vein: Secondary | ICD-10-CM

## 2015-04-29 LAB — GLUCOSE, CAPILLARY
GLUCOSE-CAPILLARY: 137 mg/dL — AB (ref 65–99)
GLUCOSE-CAPILLARY: 175 mg/dL — AB (ref 65–99)
Glucose-Capillary: 128 mg/dL — ABNORMAL HIGH (ref 65–99)
Glucose-Capillary: 149 mg/dL — ABNORMAL HIGH (ref 65–99)

## 2015-04-29 LAB — RENAL FUNCTION PANEL
Albumin: 2.4 g/dL — ABNORMAL LOW (ref 3.5–5.0)
Anion gap: 11 (ref 5–15)
BUN: 62 mg/dL — AB (ref 6–20)
CHLORIDE: 103 mmol/L (ref 101–111)
CO2: 24 mmol/L (ref 22–32)
Calcium: 8.1 mg/dL — ABNORMAL LOW (ref 8.9–10.3)
Creatinine, Ser: 3.87 mg/dL — ABNORMAL HIGH (ref 0.44–1.00)
GFR calc non Af Amer: 15 mL/min — ABNORMAL LOW (ref >60)
GFR, EST AFRICAN AMERICAN: 17 mL/min — AB (ref >60)
Glucose, Bld: 180 mg/dL — ABNORMAL HIGH (ref 65–99)
PHOSPHORUS: 5.5 mg/dL — AB (ref 2.5–4.6)
Potassium: 4.3 mmol/L (ref 3.5–5.1)
SODIUM: 138 mmol/L (ref 135–145)

## 2015-04-29 LAB — CBC
HEMATOCRIT: 22.2 % — AB (ref 36.0–46.0)
HEMOGLOBIN: 7.1 g/dL — AB (ref 12.0–15.0)
MCH: 28.9 pg (ref 26.0–34.0)
MCHC: 32 g/dL (ref 30.0–36.0)
MCV: 90.2 fL (ref 78.0–100.0)
Platelets: 461 10*3/uL — ABNORMAL HIGH (ref 150–400)
RBC: 2.46 MIL/uL — ABNORMAL LOW (ref 3.87–5.11)
RDW: 15.3 % (ref 11.5–15.5)
WBC: 9.7 10*3/uL (ref 4.0–10.5)

## 2015-04-29 LAB — HEPARIN LEVEL (UNFRACTIONATED)
Heparin Unfractionated: 0.36 IU/mL (ref 0.30–0.70)
Heparin Unfractionated: 0.31 IU/mL (ref 0.30–0.70)

## 2015-04-29 LAB — PROTIME-INR
INR: 1.21 (ref 0.00–1.49)
PROTHROMBIN TIME: 15.4 s — AB (ref 11.6–15.2)

## 2015-04-29 LAB — MAGNESIUM: MAGNESIUM: 1.7 mg/dL (ref 1.7–2.4)

## 2015-04-29 MED ORDER — LORAZEPAM 2 MG/ML IJ SOLN
1.0000 mg | Freq: Four times a day (QID) | INTRAMUSCULAR | Status: AC | PRN
  Administered 2015-04-30: 1 mg via INTRAVENOUS
  Filled 2015-04-29: qty 1

## 2015-04-29 MED ORDER — PHENOL 1.4 % MT LIQD
1.0000 | OROMUCOSAL | Status: DC | PRN
  Administered 2015-04-29: 1 via OROMUCOSAL
  Filled 2015-04-29 (×2): qty 177

## 2015-04-29 MED ORDER — WARFARIN SODIUM 5 MG PO TABS
5.0000 mg | ORAL_TABLET | Freq: Once | ORAL | Status: AC
  Administered 2015-04-29: 5 mg via ORAL
  Filled 2015-04-29: qty 1

## 2015-04-29 MED ORDER — HYDROCORTISONE NA SUCCINATE PF 100 MG IJ SOLR
50.0000 mg | Freq: Two times a day (BID) | INTRAMUSCULAR | Status: DC
  Administered 2015-04-29 – 2015-04-30 (×2): 50 mg via INTRAVENOUS
  Filled 2015-04-29 (×2): qty 2

## 2015-04-29 MED ORDER — WARFARIN VIDEO
Freq: Once | Status: AC
  Administered 2015-04-30: 1

## 2015-04-29 MED ORDER — LORAZEPAM 1 MG PO TABS
1.0000 mg | ORAL_TABLET | Freq: Four times a day (QID) | ORAL | Status: AC | PRN
  Administered 2015-04-30 – 2015-05-01 (×4): 1 mg via ORAL
  Filled 2015-04-29 (×4): qty 1

## 2015-04-29 MED ORDER — WARFARIN - PHARMACIST DOSING INPATIENT: Freq: Every day | Status: DC

## 2015-04-29 NOTE — Progress Notes (Addendum)
PULMONARY / CRITICAL CARE MEDICINE   Name: Paige Aguilar MRN: [id number] DOB: September 16, 1986    ADMISSION DATE:  04/27/2015 CONSULTATION DATE:  04/27/15  REFERRING MD :  Dr. Jeraldine Loots  CHIEF COMPLAINT:  Acute Respiratory Failure   INITIAL PRESENTATION: 29 y/o F with PMH of lupus, blood clots (supposed to be on anticoagulation but recently stopped per family), DM, hypothyroidism, who presented to [facility] on 8/3 after a train ride from [place] to [place] with a 2 day history of SOB.  Patient desaturated into the 80's and was placed on BiPAP.  She failed BiPAP and was emergently intubated.  PCCM called for admission.   STUDIES:  TTE (04/27/15): LV moderately dilated with EF 25-30%. Diastolic function could not be assessed due to tachycardia. Moderate mitral and tricuspid regurgitation. LA mildly dilated. RV systolic function moderately reduced. IVC normal in size. Mild circumferential pericardial effusion.  Venous Duplex: No evidence of acute DVT (prelim)  SIGNIFICANT EVENTS: 8/03  Admit with SOB.  Decompensated in ER, failed bipap.  Intubated.  Bloody secretions from ETT.  8/04  Extubated   SUBJECTIVE:  No acute events overnight after extubation yesterday. Denies any chest pain or pressure. Does report some mild soreness in her throat. No dyspnea or cough. Patient does admit that she has been using Xanax for the last couple of years and took "a lot" of Xanax prior to her ED presentation.  ROS: No nausea, vomiting, or abdominal pain. No headache or vision changes.  VITAL SIGNS: Temp:  [97.9 F (36.6 C)-98.9 F (37.2 C)] 98.3 F (36.8 C) (08/05 0330) Pulse Rate:  [48-121] 113 (08/05 1000) Resp:  [0-29] 18 (08/04 2000) BP: (118-144)/(75-104) 135/95 mmHg (08/05 1010) SpO2:  [84 %-100 %] 100 % (08/05 1000) Arterial Line BP: (149-153)/(90-100) 150/100 mmHg (08/04 1500) Weight:  [156 lb 8.4 oz (71 kg)] 156 lb 8.4 oz (71 kg) (08/05 0330)   HEMODYNAMICS: CVP:  [9 mmHg-18 mmHg] 18 mmHg   VENTILATOR  SETTINGS:     INTAKE / OUTPUT:  Intake/Output Summary (Last 24 hours) at 04/29/15 1135 Last data filed at 04/29/15 1013  Gross per 24 hour  Intake 1527.72 ml  Output   3700 ml  Net -2172.28 ml    PHYSICAL EXAMINATION: General:  Obese. No distress. Sleeping until awoken by me this morning. Integument:  Warm & dry. No rash on exposed skin. Right IJ central venous catheter in place. HEENT:  Moist mucus membranes. No oral ulcers. No scleral injection.  Cardiovascular:  Regular rate. No appreciable JVD. No edema.  Pulmonary:  Good aeration & clear to auscultation bilaterally. Normal work of breathing on nasal cannula oxygen. Abdomen: Soft. Normal bowel sounds. Nondistended. Nontender. Neurological: Moving all 4 extremities equally. Answered questions. Following commands. Oriented to person, place, time, and situation.  LABS:  CBC  Recent Labs Lab 04/27/15 1109  04/28/15 0500 04/28/15 1430 04/29/15 0330  WBC 15.4*  --  12.8*  --  9.7  HGB 8.2*  < > 7.5* 7.2* 7.1*  HCT 26.1*  < > 23.6* 22.7* 22.2*  PLT 488*  --  455*  --  461*  < > = values in this interval not displayed. Coag's  Recent Labs Lab 04/29/15 0330  INR 1.21     BMET  Recent Labs Lab 04/28/15 0500 04/28/15 1430 04/29/15 0330  NA 144 140 138  K 5.5* 4.6 4.3  CL 115* 110 103  CO2 21* 18* 24  BUN 56* 60* 62*  CREATININE 3.43* 3.76* 3.87*  GLUCOSE  105* 206* 180*   Electrolytes  Recent Labs Lab 04/28/15 0500 04/28/15 1430 04/29/15 0330  CALCIUM 8.3* 8.1* 8.1*  MG 1.8  --  1.7  PHOS 6.4*  --  5.5*   Sepsis Markers  Recent Labs Lab 04/27/15 1630  LATICACIDVEN 1.4     ABG  Recent Labs Lab 04/28/15 0338  PHART 7.396  PCO2ART 38.7  PO2ART 421.0*     Liver Enzymes  Recent Labs Lab 04/29/15 0330  ALBUMIN 2.4*     Cardiac Enzymes  Recent Labs Lab 04/27/15 1800 04/27/15 2350  TROPONINI 1.21* 0.43*     Glucose  Recent Labs Lab 04/28/15 0400 04/28/15 0737 04/28/15 1136  04/28/15 1647 04/28/15 2026 04/29/15 0755  GLUCAP 95 86 123* 107* 153* 137*    Imaging No results found.   ASSESSMENT / PLAN:  PULMONARY OETT 8/3 >> 8/4 A: Acute Hypoxemic Respiratory Failure - suspect in setting of PE given hx of medical non-compliance with anticoagulation Patchy infiltrates/pulmonary edema  P:   Finishing Lasix Continuing treatment for suspected acute PE with heparin drip Pharmacy consult for Coumadin Wean FiO2 for saturation greater than 94%  CARDIOVASCULAR CVL R IJ 8/3>> A:  Tachycardia  Hypertensive Urgency   P:  Finishing Lasix diuresis Monitor patient on telemetry Continue clonidine twice a day  RENAL A:   Acute on Chronic Renal Failure Assumed lupus nephropathy Hyperkalemia - resolved.  P:   Monitor urine output Trend renal function with BUN/creatinine daily Monitor electrolytes daily Solumedrol IV  q12hr  GASTROINTESTINAL A:   Morbid Obesity   P:   Heart healthy/carb modified diet Protonix po daily  HEMATOLOGIC A:   Anemia - appears chronic, baseline ~ 8.7 DVT / Suspected PE Lupus - on 10 mg prednisone QD  P:  Trend hemoglobin per CBC daily  Heparin gtt per pharmacy   Coumadin per pharmacy Checking INR now & daily Monitor for bleeding   INFECTIOUS A:    No acute infectious process   P:   Monitor for fever Trending leukocyte count daily with CBC  ENDOCRINE A:   DM  Chronic prednisone Questionable Hypothyroidism - noted on hx but not on any meds  P:   Stress dosed steroids with hydrocortisone 50 mg IV every12 hours Continuing sliding scale algorithm Accu-Cheks every before meals & at bedtime TSH normal  NEUROLOGIC A:   Agitation - resolved. Chronic Xanax use/abuse  P:   CIWA protocol Eliminate sedatives  Monitor closely    FAMILY  - Updates: No family present to update.    Today's Summary: Female with known history of lupus and history of blood clots but admits that she has not been using  her systemic anticoagulation. Also the patient admitted this morning that she was abusing Xanax prior to her admission. Patient successfully extubated yesterday after diuresis. Given concern for potential acute pulmonary embolus she has been maintained on a heparin infusion. This is transitioning to Coumadin with pharmacy's assistance. She has tolerated diuresis and her last dose of Lasix was this morning. Doppler ultrasounds of bilateral artery showed no evidence of acute clot. Patient currently being maintained on stress dose steroids and this can likely be tapered to her basal dose of prednisone depending upon renal function and continued clinical improvement. Plan to transition the patient to medical floor with telemetry monitoring and continue CIWA protocol for potential withdrawal. Consider adding beta-blocker +/- BiDil tomorrow depending on BP & volume status for treatment of systolic CHF.   Donna Christen Jamison Neighbor, M.D. [facility] Pager:  [phone] After 3pm or if no response, call [phone]  04/29/2015 11:35 AM '

## 2015-04-29 NOTE — Progress Notes (Signed)
ANTICOAGULATION CONSULT NOTE - Follow Up Consult  Pharmacy Consult for Heparin  Indication: Presumed PE  Allergies  Allergen Reactions  . Amoxicillin Rash  . Sulfa Antibiotics Rash   Patient Measurements: Height:  (149.9 cm) Weight: 156 lb 8.4 oz (71 kg) IBW/kg (Calculated) : 43.2  Vital Signs: Temp: 98.3 F (36.8 C) (08/05 0330) Temp Source: Oral (08/05 0330) BP: 135/95 mmHg (08/05 1010) Pulse Rate: 113 (08/05 1000)  Labs:  Recent Labs  04/27/15 1109  04/27/15 1800  04/27/15 2350 04/28/15 0500  04/28/15 1430 04/28/15 2210 04/29/15 0330 04/29/15 0630 04/29/15 1330  HGB 8.2*  < >  --   --   --  7.5*  --  7.2*  --  7.1*  --   --   HCT 26.1*  < >  --   --   --  23.6*  --  22.7*  --  22.2*  --   --   PLT 488*  --   --   --   --  455*  --   --   --  461*  --   --   LABPROT  --   --   --   --   --   --   --   --   --  15.4*  --   --   INR  --   --   --   --   --   --   --   --   --  1.21  --   --   HEPARINUNFRC  --   --   --   < >  --  0.67  < >  --  0.24*  --  0.36 0.31  CREATININE 3.09*  < >  --   --   --  3.43*  --  3.76*  --  3.87*  --   --   TROPONINI  --   --  1.21*  --  0.43*  --   --   --   --   --   --   --   < > = values in this interval not displayed.  Estimated Creatinine Clearance: 18.2 mL/min (by C-G formula based on Cr of 3.87).   Assessment: 29 yo female with suspected PE (noted with DVT about 2 weeks ago and history of  PE). She is on heparin and noted at goal (HL= 0.31). Hg noted as 7.1 (trend has been 8.2>>7.5>>7.2>>7.1) and and no bleeding has been noted (also discussed with MD). Coumadin to start today (day 1 of overlap with heparin). Baseline INR= 1.21.   Goal of Therapy:  Heparin level 0.3-0.7 units/ml Monitor platelets by anticoagulation protocol: Yes   Plan:  -No heparin changes -Daily CBC/HL -Coumadin  po x1 -Daily PT/INR -Monitor for bleeding  Harland German, Pharm D 04/29/2015 2:45 PM

## 2015-04-29 NOTE — Progress Notes (Signed)
ANTICOAGULATION CONSULT NOTE - Follow Up Consult  Pharmacy Consult for Heparin  Indication: Presumed PE  Allergies  Allergen Reactions  . Amoxicillin Rash  . Sulfa Antibiotics Rash   Patient Measurements: Height:  (149.9 cm) Weight: 156 lb 8.4 oz (71 kg) IBW/kg (Calculated) : 43.2  Vital Signs: Temp: 98.3 F (36.8 C) (08/05 0330) Temp Source: Oral (08/05 0330) BP: 121/96 mmHg (08/05 0600) Pulse Rate: 108 (08/05 0600)  Labs:  Recent Labs  04/27/15 1109  04/27/15 1800  04/27/15 2350 04/28/15 0500 04/28/15 1140 04/28/15 1430 04/28/15 2210 04/29/15 0330 04/29/15 0630  HGB 8.2*  < >  --   --   --  7.5*  --  7.2*  --  7.1*  --   HCT 26.1*  < >  --   --   --  23.6*  --  22.7*  --  22.2*  --   PLT 488*  --   --   --   --  455*  --   --   --  461*  --   LABPROT  --   --   --   --   --   --   --   --   --  15.4*  --   INR  --   --   --   --   --   --   --   --   --  1.21  --   HEPARINUNFRC  --   --   --   < >  --  0.67 0.81*  --  0.24*  --  0.36  CREATININE 3.09*  < >  --   --   --  3.43*  --  3.76*  --  3.87*  --   TROPONINI  --   --  1.21*  --  0.43*  --   --   --   --   --   --   < > = values in this interval not displayed.  Estimated Creatinine Clearance: 18.2 mL/min (by C-G formula based on Cr of 3.87).   Assessment: Therapeutic heparin level after rate increase, ?diagnosed with DVTs 2 weeks ago in [place] but never filled Rx, presumed PE, labs reviewed.   Goal of Therapy:  Heparin level 0.3-0.7 units/ml Monitor platelets by anticoagulation protocol: Yes   Plan:  -Continue heparin at 850 units/hr -1200 HL -Daily CBC/HL -Monitor for bleeding  Abran Duke 04/29/2015,7:37 AM

## 2015-04-29 NOTE — Progress Notes (Signed)
VASCULAR LAB PRELIMINARY  PRELIMINARY  PRELIMINARY  PRELIMINARY   Bilateral lower extremity venous duplex completed.    Preliminary report:  There is no evidence of an acute DVT bilaterally. 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 There is no evidence of a superficial thrombosis bilaterally. No evidence of a Baker's cyst was noted in the right lower extremity however there is an area of fluid in the left popliteal fossa coursing 6.02 cm into the calf consistent with a ruptured Baker's cyst.  Sheldon Amara, RVS 04/29/2015, 10:22 AM

## 2015-04-30 DIAGNOSIS — N189 Chronic kidney disease, unspecified: Secondary | ICD-10-CM

## 2015-04-30 DIAGNOSIS — J9621 Acute and chronic respiratory failure with hypoxia: Secondary | ICD-10-CM

## 2015-04-30 DIAGNOSIS — I429 Cardiomyopathy, unspecified: Secondary | ICD-10-CM

## 2015-04-30 DIAGNOSIS — I2699 Other pulmonary embolism without acute cor pulmonale: Principal | ICD-10-CM

## 2015-04-30 DIAGNOSIS — N179 Acute kidney failure, unspecified: Secondary | ICD-10-CM

## 2015-04-30 DIAGNOSIS — I5021 Acute systolic (congestive) heart failure: Secondary | ICD-10-CM

## 2015-04-30 LAB — CBC
HEMATOCRIT: 23.7 % — AB (ref 36.0–46.0)
Hemoglobin: 7.5 g/dL — ABNORMAL LOW (ref 12.0–15.0)
MCH: 28.4 pg (ref 26.0–34.0)
MCHC: 31.6 g/dL (ref 30.0–36.0)
MCV: 89.8 fL (ref 78.0–100.0)
Platelets: 478 10*3/uL — ABNORMAL HIGH (ref 150–400)
RBC: 2.64 MIL/uL — ABNORMAL LOW (ref 3.87–5.11)
RDW: 15.1 % (ref 11.5–15.5)
WBC: 10.1 10*3/uL (ref 4.0–10.5)

## 2015-04-30 LAB — GLUCOSE, CAPILLARY
GLUCOSE-CAPILLARY: 120 mg/dL — AB (ref 65–99)
GLUCOSE-CAPILLARY: 104 mg/dL — AB (ref 65–99)
Glucose-Capillary: 144 mg/dL — ABNORMAL HIGH (ref 65–99)
Glucose-Capillary: 201 mg/dL — ABNORMAL HIGH (ref 65–99)

## 2015-04-30 LAB — MAGNESIUM: MAGNESIUM: 1.6 mg/dL — AB (ref 1.7–2.4)

## 2015-04-30 LAB — PROTIME-INR
INR: 1.11 (ref 0.00–1.49)
Prothrombin Time: 14.5 seconds (ref 11.6–15.2)

## 2015-04-30 LAB — RENAL FUNCTION PANEL
ALBUMIN: 2.4 g/dL — AB (ref 3.5–5.0)
ANION GAP: 12 (ref 5–15)
BUN: 70 mg/dL — AB (ref 6–20)
CALCIUM: 7.8 mg/dL — AB (ref 8.9–10.3)
CO2: 25 mmol/L (ref 22–32)
CREATININE: 3.78 mg/dL — AB (ref 0.44–1.00)
Chloride: 101 mmol/L (ref 101–111)
GFR calc Af Amer: 17 mL/min — ABNORMAL LOW (ref >60)
GFR calc non Af Amer: 15 mL/min — ABNORMAL LOW (ref >60)
Glucose, Bld: 173 mg/dL — ABNORMAL HIGH (ref 65–99)
Phosphorus: 3.5 mg/dL (ref 2.5–4.6)
Potassium: 3.5 mmol/L (ref 3.5–5.1)
Sodium: 138 mmol/L (ref 135–145)

## 2015-04-30 LAB — HEPARIN LEVEL (UNFRACTIONATED)
HEPARIN UNFRACTIONATED: 0.22 [IU]/mL — AB (ref 0.30–0.70)
Heparin Unfractionated: 0.25 IU/mL — ABNORMAL LOW (ref 0.30–0.70)

## 2015-04-30 MED ORDER — FUROSEMIDE 40 MG PO TABS
40.0000 mg | ORAL_TABLET | Freq: Two times a day (BID) | ORAL | Status: DC
  Administered 2015-05-01 (×2): 40 mg via ORAL
  Filled 2015-04-30 (×2): qty 1

## 2015-04-30 MED ORDER — WARFARIN SODIUM 7.5 MG PO TABS
7.5000 mg | ORAL_TABLET | Freq: Once | ORAL | Status: AC
  Administered 2015-04-30: 7.5 mg via ORAL
  Filled 2015-04-30: qty 1

## 2015-04-30 MED ORDER — POLYETHYLENE GLYCOL 3350 17 G PO PACK
17.0000 g | PACK | Freq: Every day | ORAL | Status: DC
  Administered 2015-04-30: 17 g via ORAL
  Filled 2015-04-30 (×2): qty 1

## 2015-04-30 MED ORDER — FUROSEMIDE 40 MG PO TABS: 40.0000 mg | ORAL_TABLET | Freq: Every day | ORAL | Status: DC

## 2015-04-30 MED ORDER — ISOSORB DINITRATE-HYDRALAZINE 20-37.5 MG PO TABS
1.0000 | ORAL_TABLET | Freq: Three times a day (TID) | ORAL | Status: DC
  Administered 2015-04-30 – 2015-05-02 (×6): 1 via ORAL
  Filled 2015-04-30 (×6): qty 1

## 2015-04-30 MED ORDER — CARVEDILOL 3.125 MG PO TABS: 3.1250 mg | ORAL_TABLET | Freq: Two times a day (BID) | ORAL | Status: DC

## 2015-04-30 MED ORDER — FUROSEMIDE 10 MG/ML IJ SOLN
80.0000 mg | Freq: Every day | INTRAMUSCULAR | Status: DC
  Administered 2015-04-30: 80 mg via INTRAVENOUS
  Filled 2015-04-30: qty 8

## 2015-04-30 MED ORDER — CARVEDILOL 6.25 MG PO TABS
6.2500 mg | ORAL_TABLET | Freq: Two times a day (BID) | ORAL | Status: DC
  Administered 2015-04-30 – 2015-05-01 (×3): 6.25 mg via ORAL
  Filled 2015-04-30 (×3): qty 1

## 2015-04-30 MED ORDER — HYDROCORTISONE NA SUCCINATE PF 100 MG IJ SOLR
25.0000 mg | Freq: Two times a day (BID) | INTRAMUSCULAR | Status: DC
  Administered 2015-04-30: 25 mg via INTRAVENOUS
  Filled 2015-04-30: qty 2

## 2015-04-30 MED ORDER — ZOLPIDEM TARTRATE 5 MG PO TABS
5.0000 mg | ORAL_TABLET | Freq: Once | ORAL | Status: AC
  Administered 2015-04-30: 5 mg via ORAL
  Filled 2015-04-30: qty 1

## 2015-04-30 NOTE — Progress Notes (Addendum)
Notified [place], [place] oncall that pt requesting sleep medication. Pt states she has taken Ambien before. Will continue to monitor pt. Nelda Marseille, RN

## 2015-04-30 NOTE — Progress Notes (Signed)
ANTICOAGULATION CONSULT NOTE - Follow Up Consult  Pharmacy Consult for Heparin  Indication: Presumed PE  Allergies  Allergen Reactions  . Amoxicillin Rash  . Sulfa Antibiotics Rash   Patient Measurements: Height:  (149.9 cm) Weight: 151 lb 0.2 oz (68.5 kg) IBW/kg (Calculated) : 43.2 Heparin dosing weight: 60.4kg  Vital Signs: Temp: 98.4 F (36.9 C) (08/06 1218) Temp Source: Oral (08/06 1218) BP: 143/97 mmHg (08/06 1218) Pulse Rate: 101 (08/06 1218)  Labs:  Recent Labs  04/27/15 1800  04/27/15 2350  04/28/15 0500  04/28/15 1430  04/29/15 0330  04/29/15 1330 04/30/15 0532 04/30/15 0533 04/30/15 1520  HGB  --   --   --   < > 7.5*  --  7.2*  --  7.1*  --   --  7.5*  --   --   HCT  --   --   --   < > 23.6*  --  22.7*  --  22.2*  --   --  23.7*  --   --   PLT  --   --   --   --  455*  --   --   --  461*  --   --  478*  --   --   LABPROT  --   --   --   --   --   --   --   --  15.4*  --   --  14.5  --   --   INR  --   --   --   --   --   --   --   --  1.21  --   --  1.11  --   --   HEPARINUNFRC  --   < >  --   --  0.67  < >  --   < >  --   < > 0.31 0.22*  --  0.25*  CREATININE  --   --   --   < > 3.43*  --  3.76*  --  3.87*  --   --   --  3.78*  --   TROPONINI 1.21*  --  0.43*  --   --   --   --   --   --   --   --   --   --   --   < > = values in this interval not displayed.  Estimated Creatinine Clearance: 18.3 mL/min (by C-G formula based on Cr of 3.78).  Assessment: 29 yo female with suspected PE (noted with DVT about 2 weeks ago and history of  PE). Today is D#2/5 of heparin/warfarin overlap.  Heparin level low this morning with AM labs and rate was increased. Repeat level still low at 0.25units/mL. Spoke with RN- no issues with line, hasn't been stopped.   Hgb 7.5, plts 478- No bleeding noted.  INR still low this morning at 1.11. INR must be therapeutic for 24h before heparin can be stopped. Must be on heparin for a minimum of 5 days.  Goal of Therapy:   Heparin level 0.3-0.7 units/ml Monitor platelets by anticoagulation protocol: Yes   Plan:  -increase heparin to 1050 units/hr -heparin level in 6 hours -warfarin 7.5mg  po x1 tonight -daily INR, HL and CBC  Adaly Puder D. Tadd Holtmeyer, PharmD, BCPS Clinical Pharmacist Pager: [phone] 04/30/2015 4:26 PM

## 2015-04-30 NOTE — Consult Note (Signed)
CARDIOLOGY CONSULT NOTE  Patient ID: Paige Aguilar MRN: [id number] DOB/AGE: 31-Jun-1987 31 y.o.  Admit date: 04/27/2015 Primary Cardiologist: None Reason for Consultation: CHF/depressed EF  HPI: 29 yo with history of SLE with lupus nephritis and CKD, prior VTE, and short QT syndrome was admitted with acute respiratory distress thought to be most likely related to PE.  She had marked sinus tachycardia.  She also had CHF.  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 Imaging for PE was not done as it was not thought that it would change management.  She has history of prior PE and was supposed to to be on Eliquis but has not been taking it.    She has SLE and has been on chronic prednisone.  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  She says that she has a diagnosis of short QT syndrome and had ICD placed for this in the setting of syncope.  She has never been shocked.  She does not know the make of the ICD.    Cardiology was called because echo done this admission showed EF 25-30%.  Prior echo done here in 4/16 showed EF 50-55%.  Of note, the echo was done while she was markedly tachycardic (HR 170s).    Currently, she feels like she is back to baseline. No dyspnea.  No lightheadedness or chest pain.   Review of systems complete and found to be negative unless listed above in HPI  Past Medical History: 1. SLE: On chronic prednisone, history of lupus nephritis. 2. VTE: History of prior PE/DVT, noncompliant with anticoagulation.  3. CKD: Lupus nephritis history.  4. Benzodiazepine abuse: Buys Xanax off street. 5. Short QT syndrome: Has ICD because of history of syncope.  Does not know the make.  Placed in [place].  6. Cardiomyopathy: Newly noted this admission.  Echo (8/16) with EF 25-30%, moderate MR, moderate TR, moderately decreased RV systolic function.  Echo was, of note, done with marked sinus tachycardia (HR to 170).  7. HTN  FH: No premature  CAD.   History   Social History  . Marital Status: Single    Spouse Name: N/A  . Number of Children: N/A  . Years of Education: N/A   Occupational History  . Not on file.   Social History Main Topics  . Smoking status: Never Smoker   . Smokeless tobacco: Not on file  . Alcohol Use: No  . Drug Use: No  . Sexual Activity: Not on file   Other Topics Concern  . Not on file   Social History Narrative     Prescriptions prior to admission  Medication Sig Dispense Refill Last Dose  . oxycodone (ROXICODONE) 30 MG immediate release tablet Take 30 mg by mouth 4 (four) times daily as needed for pain.    week ago  . predniSONE (DELTASONE) 10 MG tablet Take 10 mg by mouth daily with breakfast.   04/27/2015 at Unknown time  . QUEtiapine (SEROQUEL) 50 MG tablet Take 50 mg by mouth at bedtime.   week and a half ago   Current Scheduled Meds: . antiseptic oral rinse  7 mL Mouth Rinse BID  . carvedilol  6.25 mg Oral BID WC  . [START ON 05/01/2015] furosemide  40 mg Oral Daily  . hydrocortisone sodium succinate  25 mg Intravenous Q12H  . insulin aspart  0-15 Units Subcutaneous TID AC & HS  . isosorbide-hydrALAZINE  1 tablet Oral TID  .  pantoprazole  40 mg Oral Daily  . sodium chloride  10-40 mL Intracatheter Q12H  . warfarin  7.5 mg Oral ONCE-1800  . Warfarin - Pharmacist Dosing Inpatient   Does not apply q1800   Continuous Infusions: . heparin 950 Units/hr (04/30/15 0734)   PRN Meds:.sodium chloride, LORazepam **OR** LORazepam, menthol-cetylpyridinium, phenol, sodium chloride  Physical exam Blood pressure 143/97, pulse 101, temperature 98.4 F (36.9 C), temperature source Oral, resp. rate 19, height  (1.499 m), weight 151 lb 0.2 oz (68.5 kg), last menstrual period 04/27/2015, SpO2 100 %. General: NAD Neck: JVP 8 cm, no thyromegaly or thyroid nodule.  Lungs: Slight crackles at CV: Nondisplaced PMI.  Heart regular S1/S2, no S3/S4, no murmur.  No peripheral edema.  No carotid bruit.   Normal pedal pulses.  Abdomen: Soft, nontender, no hepatosplenomegaly, no distention.  Skin: Intact without lesions or rashes.  Neurologic: Alert and oriented x 3.  Psych: Normal affect. Extremities: No clubbing or cyanosis.  HEENT: Normal.   Labs:   Lab Results  Component Value Date   WBC 10.1 04/30/2015   HGB 7.5* 04/30/2015   HCT 23.7* 04/30/2015   MCV 89.8 04/30/2015   PLT 478* 04/30/2015    Recent Labs Lab 04/30/15 0533  NA 138  K 3.5  CL 101  CO2 25  BUN 70*  CREATININE 3.78*  CALCIUM 7.8*  GLUCOSE 173*   Lab Results  Component Value Date   TROPONINI 0.43* 04/27/2015   Radiology:  - CXR: Pulmonary vascular congestions  EKG: Sinus tachycardia rate 150  ASSESSMENT AND PLAN: 29 yo with history of SLE with lupus nephritis and CKD, prior VTE, and short QT syndrome was admitted with acute respiratory distress thought to be most likely related to PE.  As part of workup, she was found to have low EF on echo.  1. Cardiomyopathy: EF 25-30% by echo this admission.  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  I think that LV function needs to be confirmed now that HR is lower. Her initial echo was done with HR in the 170s (sinus tachy) which may have made the EF appear to be falsely low.  She is currently getting Lasix 80 mg IV daily.  She denies dyspnea and does not look volume overloaded on exam but CXR shows some residual pulmonary vascular congestion.  - Repeat limited echo for EF now that HR is considerably lower.  - Increase Coreg to 6.25 mg bid and stop clonidine (watch for rebound hypertension).  - Add Bidil 1 tab tid.  - No ACEI or spironolactone with significant CKD.  - Can stop IV Lasix, start Lasix 40 mg po bid tomorrow.  2. 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  She was very tachycardic at admission, RV showed  moderately decreased function on initial echo.  - Heparin bridge to coumadin.  Will need long-term anticoagulation.  Consider IVC filter given history of noncompliance.  3. CKD: Creatinine 3.78 today.  Stop IV Lasix as above (can transition to po).  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  Needs outpatient followup with nephrology.  4. Short QT syndrome: Per patient's report.  She has an ICD, not sure what make.  Never shocked.   Marca Ancona 04/30/2015 3:27 PM

## 2015-04-30 NOTE — Progress Notes (Addendum)
PATIENT DETAILS Name: Paige Aguilar Age: 29 y.o. Sex: female Date of Birth: 1986/09/06 Admit Date: 04/27/2015 Admitting Physician [name], MD PCP:No PCP Per Patient  Brief narrative:  29 year old African female with history of lupus on chronic prednisone, venous thromboembolism not compliant to anticoagulation, chronic kidney disease who was admitted by PCCM on 8/3 with acute respiratory failure and required emergent intubation and mechanical ventilation. This was felt to be multifactorial from presumed pulmonary embolism, acute systolic heart failure, and possibly benzodiazepine abuse. She was diuresed, started on anticoagulation and extubated on 8/4. 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 After stabilization, she was transferred to telemetry unit-and hospitalist service assumed care on 8/6.  Subjective: No longer short of breath. Inquiring about discharge. Claims she buys Xanax off the streets.  Assessment/Plan: Active Problems: Respiratory failure with hypoxia: Multifactorial from suspected pulmonary embolism (given noncompliance), acute systolic heart failure and possibly benzodiazepine use. Intubated on admission, after initiation of diuretics and IV heparin successfully extubated on 8/4. Doing well postextubation and now on room air.  Presumed pulmonary embolism: Noncompliant with anticoagulation, on IV heparin and overlapping Coumadin. No RV strain on echocardiogram, suspect definite diagnosis of pulmonary embolism would not change management. Continue IV heparin until INR therapeutic. Have emphasized importance of compliance to Coumadin and follow-up.  New onset of Acute systolic heart failure: Echocardiogram on admission shows EF of 25-30%, new when compared to prior study in April 2016. Continue Lasix-  Cards consulted await further input. Start low-dose beta blocker, not a candidate for ACEI/ARB-given C daily. Watch creatinine closely.  Benzodiazepine abuse: Claims she buys Xanax off the street-not sure how many milligrams she uses on a daily basis, continue Ativan per CIWA protocol.  Essential hypertension: Continue clonidine, add low-dose Coreg and titrate accordingly.  Acute on Chronic kidney disease stage IV: Creatinine slightly worse than usual baseline-likely secondary to diuretics/new onset acute systolic heart failure. 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 Currently on stress dose hydrocortisone-will slowly taper back to prednisone  History of lupus: On 10 mg of prednisone since 2004-currently on stress dosing of hydrocortisone-taper back to prednisone in the next few days  Anemia: Recent iron panel in April 2016 consistent with chronic disease. Suspect related to see daily/lupus-now slightly worse than usual baseline because of acute illness. Continue to follow and transfuse if needed.  History of prolonged QTC syndrome with syncope:per patient she required AICD implantation in 2014 at [facility] in [place].Never shocked.  ? History of hypothyroidism: However TSH normal. Defer further workup for now  3.5 cm exophytic right renal mass: Unfortunately unable to do MRI given pacemaker/defibrillator in place, unable to do a CT with contrast given CKD. Defer further workup and monitoring into the outpatient setting   Disposition: Remain inpatient-home when INR is therapeutic  Antimicrobial agents  See below  Anti-infectives    None      DVT Prophylaxis: Heparing gtt/coumadin  Code Status: Full code  Family Communication None at bedside  Procedures: ETT 8/3>>8/4 TTE 8/3 Central Line 8/3>>  CONSULTS:  cardiology and pulmonary/intensive care  Time spent 30  minutes-Greater than 50% of this time was spent in counseling, explanation of diagnosis, planning of further  management, and coordination of care.  MEDICATIONS: Scheduled Meds: . antiseptic oral rinse  7 mL Mouth Rinse BID  . cloNIDine  0.1 mg Oral BID  . hydrocortisone sodium succinate  50 mg Intravenous Q12H  . insulin aspart  0-15 Units Subcutaneous TID AC & HS  . pantoprazole  40 mg Oral Daily  . sodium chloride  10-40 mL Intracatheter Q12H  . warfarin  7.5 mg Oral ONCE-1800  . warfarin   Does not apply Once  . Warfarin - Pharmacist Dosing Inpatient   Does not apply q1800   Continuous Infusions: . heparin 950 Units/hr (04/30/15 0734)   PRN Meds:.sodium chloride, LORazepam **OR** LORazepam, menthol-cetylpyridinium, phenol, sodium chloride    PHYSICAL EXAM: Vital signs in last 24 hours: Filed Vitals:   04/29/15 1300 04/29/15 1719 04/30/15 0012 04/30/15 0516  BP: 144/119 137/102 141/96 137/102  Pulse:  95 102 96  Temp:  98.4 F (36.9 C) 98.2 F (36.8 C) 98.2 F (36.8 C)  TempSrc:  Oral Oral Oral  Resp:  20 18 20   Height:      Weight:    68.5 kg (151 lb 0.2 oz)  SpO2:  100% 99% 100%    Weight change: -2.5 kg (-5 lb 8.2 oz) Filed Weights   04/28/15 0400 04/29/15 0330 04/30/15 0516  Weight: 71 kg (156 lb 8.4 oz) 71 kg (156 lb 8.4 oz) 68.5 kg (151 lb 0.2 oz)   Body mass index is 30.49 kg/(m^2).   Gen Exam: Awake and alert with clear spee Neck: Supple, No JVD.   Chest: B/L Clear.  CVS: S1 S2 Regular, no murmurs.  Abdomen: soft, BS +, non tender, non distended.  Extremities: no edema, lower extremities warm to touch. Neurologic: Non Focal.   Skin: No Rash.   Wounds: N/A.    Intake/Output from previous day:  Intake/Output Summary (Last 24 hours) at 04/30/15 1204 Last data filed at 04/29/15 1907  Gross per 24 hour  Intake  179.5 ml  Output    650 ml  Net -470.5 ml     LAB RESULTS: CBC  Recent Labs Lab 04/27/15 1109 04/27/15 1147 04/28/15 0500  04/28/15 1430 04/29/15 0330 04/30/15 0532  WBC 15.4*  --  12.8*  --  9.7 10.1  HGB 8.2* 9.2* 7.5* 7.2* 7.1* 7.5*  HCT 26.1* 27.0* 23.6* 22.7* 22.2* 23.7*  PLT 488*  --  455*  --  461* 478*  MCV 91.6  --  91.1  --  90.2 89.8  MCH 28.8  --  29.0  --  28.9 28.4  MCHC 31.4  --  31.8  --  32.0 31.6  RDW 15.4  --  15.6*  --  15.3 15.1  LYMPHSABS 2.3  --   --   --   --   --   MONOABS 1.0  --   --   --   --   --   EOSABS 0.0  --   --   --   --   --   BASOSABS 0.0  --   --   --   --   --     Chemistries   Recent Labs Lab 04/27/15 1109 04/27/15 1147 04/28/15 0500 04/28/15 1430 04/29/15 0330 04/30/15 0532 04/30/15 0533  NA 143 145 144 140 138  --  138  K 3.6 3.5 5.5* 4.6 4.3  --  3.5  CL 113* 113* 115* 110 103  --  101  CO2 19*  --  21* 18* 24  --  25  GLUCOSE 95 93 105* 206* 180*  --  173*  BUN 50* 47* 56* 60* 62*  --  70*  CREATININE 3.09* 3.00* 3.43* 3.76* 3.87*  --  3.78*  CALCIUM 8.5*  --  8.3* 8.1* 8.1*  --  7.8*  MG  --   --  1.8  --  1.7 1.6*  --     CBG:  Recent Labs Lab 04/29/15 0755 04/29/15 1314 04/29/15 1722 04/29/15 2109 04/30/15 0742  GLUCAP 137* 128* 149* 175* 120*    GFR Estimated Creatinine Clearance: 18.3 mL/min (by C-G formula based on Cr of 3.78).  Coagulation profile  Recent Labs Lab 04/29/15 0330 04/30/15 0532  INR 1.21 1.11    Cardiac Enzymes  Recent Labs Lab 04/27/15 1800 04/27/15 2350  TROPONINI 1.21* 0.43*    Invalid input(s): POCBNP No results for input(s): DDIMER in the last 72 hours. No results for input(s): HGBA1C in the last 72 hours.  Recent Labs  04/27/15 1630  TRIG 134    Recent Labs  04/27/15 1630  TSH 1.331   No results for input(s): VITAMINB12, FOLATE, FERRITIN, TIBC, IRON, RETICCTPCT in the last 72 hours. No results for input(s): LIPASE, AMYLASE in the last 72 hours.  Urine Studies No results for input(s): UHGB, CRYS in the last 72 hours.  Invalid input(s): UACOL, UAPR, USPG, UPH, UTP, UGL, UKET,  UBIL, UNIT, UROB, ULEU, UEPI, UWBC, URBC, UBAC, CAST, UCOM, BILUA  MICROBIOLOGY: Recent Results (from the past 240 hour(s))  MRSA PCR Screening     Status: None   Collection Time: 04/27/15  2:19 PM  Result Value Ref Range Status   MRSA by PCR NEGATIVE NEGATIVE Final    Comment:        The GeneXpert MRSA Assay (FDA approved for NASAL specimens only), is one component of a comprehensive MRSA colonization surveillance program. It is not intended to diagnose MRSA infection nor to guide or monitor treatment for MRSA infections.     RADIOLOGY STUDIES/RESULTS: Dg Chest Port 1 View  04/28/2015   CLINICAL DATA:  Respiratory failure.  Dyspnea.  EXAM: PORTABLE CHEST - 1 VIEW  COMPARISON:  04/27/2015  FINDINGS: The endotracheal tube is 3 cm above the carina. There is a right jugular central line extending into the cavoatrial junction. There is a nasogastric tube extending into the stomach. No pneumothorax. Central and basilar alveolar opacities persist. Small pleural effusions persist bilaterally.  IMPRESSION: Support equipment appears satisfactorily positioned.  Unchanged central and basilar edema or infiltrate, and pleural effusions. This likely represents congestive heart failure.   Electronically Signed   By: Ellery Plunk M.D.   On: 04/28/2015 06:56   Dg Chest Port 1 View  04/27/2015   CLINICAL DATA:  Central line placement  EXAM: PORTABLE CHEST - 1 VIEW  COMPARISON:  04/27/2015  FINDINGS: Interval placement of right internal jugular central line. The tip is in the SVC. No pneumothorax. Endotracheal tube and NG tube are unchanged. Left pacer is unchanged.  Cardiomegaly with vascular congestion and bilateral airspace opacities which likely reflects edema. Small bilateral effusions.  IMPRESSION: Right central line tip in the SVC.  No pneumothorax.  Continued mild CHF.  Small bilateral effusions.   Electronically Signed   By: Charlett Nose M.D.   On: 04/27/2015 16:44   Dg Chest Portable 1  View  04/27/2015   CLINICAL DATA:  Hypoxia  EXAM: PORTABLE CHEST - 1 VIEW  COMPARISON:  April 27, 2015 study obtained earlier in the day  FINDINGS: Endotracheal tube tip is 2.8 cm above the carina. Nasogastric tube tip and side port are below the diaphragm. Pacemaker leads are attached to the right atrium right ventricle. No pneumothorax. There is stable interstitial edema. There is a small right effusion with fluid tracking into the minor fissure. Heart is mildly enlarged with pulmonary vascularity within normal limits. No adenopathy.  IMPRESSION: Tube positions as described without pneumothorax. Evidence of a degree of congestive heart failure. No change in pacemaker lead positions.   Electronically Signed   By: Bretta Bang III M.D.   On: 04/27/2015 13:13   Dg Chest Port 1 View  04/27/2015   CLINICAL DATA:  Short of breath.  Lupus.  Diabetes.  EXAM: PORTABLE CHEST - 1 VIEW  COMPARISON:  01/20/2015  FINDINGS: Cardiac enlargement. Bilateral airspace disease consistent with pulmonary edema has progressed from the prior study. Small right effusion is unchanged. Mild right lower lobe atelectasis unchanged.  AICD in unchanged position.  Congestive heart failure with edema.  IMPRESSION: No active disease.   Electronically Signed   By: Marlan Palau M.D.   On: 04/27/2015 11:18    Jeoffrey Massed, MD  [facility] Pager:[phone]  If 7PM-7AM, please contact night-coverage [email or web address] Password [email or web address] 04/30/2015, 12:04 PM   LOS: 3 days

## 2015-04-30 NOTE — Progress Notes (Signed)
ANTICOAGULATION CONSULT NOTE - Follow Up Consult  Pharmacy Consult for Heparin  Indication: Presumed PE  Allergies  Allergen Reactions  . Amoxicillin Rash  . Sulfa Antibiotics Rash   Patient Measurements: Height:  (149.9 cm) Weight: 151 lb 0.2 oz (68.5 kg) IBW/kg (Calculated) : 43.2  Vital Signs: Temp: 98.2 F (36.8 C) (08/06 0516) Temp Source: Oral (08/06 0516) BP: 137/102 mmHg (08/06 0516) Pulse Rate: 96 (08/06 0516)  Labs:  Recent Labs  04/27/15 1800  04/27/15 2350 04/28/15 0500  04/28/15 1430  04/29/15 0330 04/29/15 0630 04/29/15 1330 04/30/15 0532 04/30/15 0533  HGB  --   --   --  7.5*  --  7.2*  --  7.1*  --   --  7.5*  --   HCT  --   --   --  23.6*  --  22.7*  --  22.2*  --   --  23.7*  --   PLT  --   --   --  455*  --   --   --  461*  --   --  478*  --   LABPROT  --   --   --   --   --   --   --  15.4*  --   --  14.5  --   INR  --   --   --   --   --   --   --  1.21  --   --  1.11  --   HEPARINUNFRC  --   < >  --  0.67  < >  --   < >  --  0.36 0.31 0.22*  --   CREATININE  --   --   --  3.43*  --  3.76*  --  3.87*  --   --   --  3.78*  TROPONINI 1.21*  --  0.43*  --   --   --   --   --   --   --   --   --   < > = values in this interval not displayed.  Estimated Creatinine Clearance: 18.3 mL/min (by C-G formula based on Cr of 3.78).   Assessment: Sub-therapeutic heparin level, no issues per RN.   Goal of Therapy:  Heparin level 0.3-0.7 units/ml Monitor platelets by anticoagulation protocol: Yes   Plan:  -Increase heparin to 950 units/hr -1300 HL -Daily CBC/HL -Monitor for bleeding  Paige Aguilar 04/30/2015,6:15 AM

## 2015-05-01 ENCOUNTER — Inpatient Hospital Stay (HOSPITAL_COMMUNITY): Payer: Medicaid Other

## 2015-05-01 DIAGNOSIS — R079 Chest pain, unspecified: Secondary | ICD-10-CM

## 2015-05-01 DIAGNOSIS — J9601 Acute respiratory failure with hypoxia: Secondary | ICD-10-CM

## 2015-05-01 DIAGNOSIS — I420 Dilated cardiomyopathy: Secondary | ICD-10-CM | POA: Diagnosis not present

## 2015-05-01 DIAGNOSIS — M3214 Glomerular disease in systemic lupus erythematosus: Secondary | ICD-10-CM

## 2015-05-01 DIAGNOSIS — I5021 Acute systolic (congestive) heart failure: Secondary | ICD-10-CM | POA: Diagnosis present

## 2015-05-01 DIAGNOSIS — Z452 Encounter for adjustment and management of vascular access device: Secondary | ICD-10-CM

## 2015-05-01 DIAGNOSIS — I509 Heart failure, unspecified: Secondary | ICD-10-CM

## 2015-05-01 DIAGNOSIS — N185 Chronic kidney disease, stage 5: Secondary | ICD-10-CM

## 2015-05-01 LAB — CBC
HCT: 22.1 % — ABNORMAL LOW (ref 36.0–46.0)
Hemoglobin: 7.1 g/dL — ABNORMAL LOW (ref 12.0–15.0)
MCH: 29.1 pg (ref 26.0–34.0)
MCHC: 32.1 g/dL (ref 30.0–36.0)
MCV: 90.6 fL (ref 78.0–100.0)
Platelets: 469 10*3/uL — ABNORMAL HIGH (ref 150–400)
RBC: 2.44 MIL/uL — AB (ref 3.87–5.11)
RDW: 14.7 % (ref 11.5–15.5)
WBC: 10.2 10*3/uL (ref 4.0–10.5)

## 2015-05-01 LAB — RENAL FUNCTION PANEL
ALBUMIN: 2.4 g/dL — AB (ref 3.5–5.0)
Anion gap: 10 (ref 5–15)
BUN: 66 mg/dL — ABNORMAL HIGH (ref 6–20)
CO2: 28 mmol/L (ref 22–32)
Calcium: 8 mg/dL — ABNORMAL LOW (ref 8.9–10.3)
Chloride: 101 mmol/L (ref 101–111)
Creatinine, Ser: 3.42 mg/dL — ABNORMAL HIGH (ref 0.44–1.00)
GFR calc non Af Amer: 17 mL/min — ABNORMAL LOW (ref >60)
GFR, EST AFRICAN AMERICAN: 20 mL/min — AB (ref >60)
Glucose, Bld: 117 mg/dL — ABNORMAL HIGH (ref 65–99)
POTASSIUM: 3.5 mmol/L (ref 3.5–5.1)
Phosphorus: 2.8 mg/dL (ref 2.5–4.6)
SODIUM: 139 mmol/L (ref 135–145)

## 2015-05-01 LAB — TYPE AND SCREEN
ABO/RH(D): O POS
ANTIBODY SCREEN: POSITIVE
DAT, IGG: POSITIVE
PT AG TYPE: NEGATIVE

## 2015-05-01 LAB — RETICULOCYTES
RBC.: 2.73 MIL/uL — ABNORMAL LOW (ref 3.87–5.11)
RETIC CT PCT: 2.4 % (ref 0.4–3.1)
Retic Count, Absolute: 65.5 10*3/uL (ref 19.0–186.0)

## 2015-05-01 LAB — MAGNESIUM: MAGNESIUM: 1.5 mg/dL — AB (ref 1.7–2.4)

## 2015-05-01 LAB — PROTIME-INR
INR: 1.15 (ref 0.00–1.49)
PROTHROMBIN TIME: 14.9 s (ref 11.6–15.2)

## 2015-05-01 LAB — VITAMIN B12: Vitamin B-12: 391 pg/mL (ref 180–914)

## 2015-05-01 LAB — FERRITIN: FERRITIN: 642 ng/mL — AB (ref 11–307)

## 2015-05-01 LAB — GLUCOSE, CAPILLARY
Glucose-Capillary: 101 mg/dL — ABNORMAL HIGH (ref 65–99)
Glucose-Capillary: 165 mg/dL — ABNORMAL HIGH (ref 65–99)
Glucose-Capillary: 120 mg/dL — ABNORMAL HIGH (ref 65–99)
Glucose-Capillary: 142 mg/dL — ABNORMAL HIGH (ref 65–99)

## 2015-05-01 LAB — HEPARIN LEVEL (UNFRACTIONATED)
HEPARIN UNFRACTIONATED: 0.32 [IU]/mL (ref 0.30–0.70)
Heparin Unfractionated: 0.64 IU/mL (ref 0.30–0.70)

## 2015-05-01 LAB — IRON AND TIBC
Iron: 63 ug/dL (ref 28–170)
Saturation Ratios: 30 % (ref 10.4–31.8)
TIBC: 213 ug/dL — ABNORMAL LOW (ref 250–450)
UIBC: 150 ug/dL

## 2015-05-01 LAB — FOLATE: Folate: 6.3 ng/mL (ref >5.9)

## 2015-05-01 MED ORDER — PREDNISONE 20 MG PO TABS
20.0000 mg | ORAL_TABLET | Freq: Every day | ORAL | Status: DC
  Administered 2015-05-01 – 2015-05-02 (×2): 20 mg via ORAL
  Filled 2015-05-01 (×2): qty 1

## 2015-05-01 MED ORDER — APIXABAN 5 MG PO TABS
10.0000 mg | ORAL_TABLET | Freq: Two times a day (BID) | ORAL | Status: DC
  Administered 2015-05-01 – 2015-05-02 (×3): 10 mg via ORAL
  Filled 2015-05-01 (×3): qty 2

## 2015-05-01 MED ORDER — WARFARIN SODIUM 7.5 MG PO TABS: 7.5000 mg | ORAL_TABLET | Freq: Once | ORAL | Status: DC

## 2015-05-01 MED ORDER — HYDROCODONE-ACETAMINOPHEN 5-325 MG PO TABS
1.0000 | ORAL_TABLET | Freq: Four times a day (QID) | ORAL | Status: DC | PRN
  Administered 2015-05-01 – 2015-05-02 (×5): 1 via ORAL
  Filled 2015-05-01 (×5): qty 1

## 2015-05-01 MED ORDER — APIXABAN 5 MG PO TABS: 5.0000 mg | ORAL_TABLET | Freq: Two times a day (BID) | ORAL | Status: DC

## 2015-05-01 NOTE — Progress Notes (Signed)
PATIENT DETAILS Name: Paige Aguilar Age: 29 y.o. 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 This was felt to be multifactorial from presumed pulmonary embolism, acute systolic heart failure, and possibly benzodiazepine abuse. She was diuresed, started on anticoagulation and extubated on 8/4. 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 After stabilization, she was transferred to telemetry unit-and hospitalist service assumed care on 8/6.  Subjective: No longer short of breath. Inquiring about discharge. Claims she buys Xanax off the streets.  Assessment/Plan:  Respiratory failure with hypoxia: Multifactorial from suspected pulmonary embolism (given noncompliance), acute systolic heart failure and possibly benzodiazepine overuse along with possible PE. Intubated on admission, after initiation of diuretics and IV heparin successfully extubated on 8/4. Doing well postextubation and now on room air.  Presumed pulmonary embolism: Noncompliant with anticoagulation which was Eliquis at home, was on IV heparin and overlapping Coumadin. No RV strain on echocardiogram, suspect definite diagnosis of pulmonary embolism would not change management. Transition to Eliquis on 05/01/2015 counseled on compliance, case management requested to provide assistance with Eliquis.  New onset of Acute systolic heart failure: Echocardiogram on admission shows EF of 25-30%, new when  compared to prior study in April 2016. Continue Lasix- Cards consulted await further input. Started on  low-dose beta blocker, not a candidate for ACEI/ARB-given 31 daily. Watch creatinine closely. Repeat echocardiogram ordered by cardiology as previous echo done this admission was at the time of significant tachycardia.  Benzodiazepine abuse: Claims she buys Xanax off the street-not sure how many milligrams she uses on a daily basis, continue Ativan per CIWA protocol.  Essential hypertension: Continue clonidine, add low-dose Coreg and titrate accordingly.  Acute on Chronic kidney disease stage IV: Baseline creatinine close to 3, mild ARF. 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 Renal function improving with supportive care and steroids. Outpatient renal follow-up post discharge.   History of lupus: On 10 mg of prednisone since fish to oral prednisone from IV hydrocortisone on 05/01/2015 Will gently taper down to 10 mg.   Anemia: Recent iron panel in April 2016 consistent with chronic disease. Suspect related to see daily/lupus-now slightly worse than usual baseline because of acute illness. Continue to follow and transfuse if needed.  History of prolonged QTC syndrome with syncope:per patient she required AICD implantation in 2014 at [facility] care [facility] in [place].Never shocked.  ? History of hypothyroidism: However TSH normal. Follow with PCP.  3.5 cm exophytic right renal mass: Unfortunately unable to do MRI given pacemaker/defibrillator in place, unable to do a CT with contrast given CKD. Have her follow with urology in the outpatient setting within a week.     Disposition: Remain inpatient-home when INR is therapeutic  Antimicrobial agents  See below  Anti-infectives    None      DVT Prophylaxis: Heparing gtt/coumadin - transition to Eliquis on 05/01/2015  Code Status: Full  code  Family Communication  None at bedside  Procedures: ETT 8/3>>8/4 TTE 8/3 Central Line 8/3>> will DC 8/7  CONSULTS:   cardiology and pulmonary/intensive care  Time spent 30 minutes-Greater than 50% of this time was spent in counseling, explanation of diagnosis, planning of further management, and coordination of care.  MEDICATIONS: Scheduled Meds: . antiseptic oral rinse  7 mL Mouth Rinse BID  . carvedilol  6.25 mg Oral BID WC  . furosemide  40 mg Oral BID  . insulin aspart  0-15 Units Subcutaneous TID AC & HS  . isosorbide-hydrALAZINE  1 tablet Oral TID  . pantoprazole  40 mg Oral Daily  . polyethylene glycol  17 g Oral Daily  . predniSONE  20 mg Oral Q breakfast  . warfarin  7.5 mg Oral ONCE-1800  . Warfarin - Pharmacist Dosing Inpatient   Does not apply q1800   Continuous Infusions: . heparin 1,150 Units/hr (05/01/15 0103)   PRN Meds:.sodium chloride, HYDROcodone-acetaminophen, LORazepam **OR** LORazepam, menthol-cetylpyridinium, phenol    PHYSICAL EXAM: Vital signs in last 24 hours: Filed Vitals:   04/30/15 1800 04/30/15 2102 04/30/15 2358 05/01/15 0601  BP: 139/97 131/86 133/93 129/89  Pulse: 88 99 97 101  Temp:  98.4 F (36.9 C) 98.6 F (37 C) 98.6 F (37 C)  TempSrc:  Oral Oral Oral  Resp:  Height:      Weight:    68.5 kg (151 lb 0.2 oz)  SpO2: 100% 100% 100% 100%    Weight change: 0 kg (0 lb) Filed Weights   04/29/15 0330 04/30/15 0516 05/01/15 0601  Weight: 71 kg (156 lb 8.4 oz) 68.5 kg (151 lb 0.2 oz) 68.5 kg (151 lb 0.2 oz)   Body mass index is 30.49 kg/(m^2).   Gen Exam: Awake and alert with clear spee Neck: Supple, No JVD.   Chest: B/L Clear.  CVS: S1 S2 Regular, no murmurs.  Abdomen: soft, BS +, non tender, non distended.  Extremities: no edema, lower extremities warm to touch. Neurologic: Non Focal.   Skin: No Rash.   Wounds: N/A.    Intake/Output from previous day:  Intake/Output Summary (Last 24 hours) at 05/01/15  1122 Last data filed at 05/01/15 0612  Gross per 24 hour  Intake 242.75 ml  Output      0 ml  Net 242.75 ml     LAB RESULTS: CBC  Recent Labs Lab 04/27/15 1109  04/28/15 0500 04/28/15 1430 04/29/15 0330 04/30/15 0532 05/01/15 0553  WBC 15.4*  --  12.8*  --  9.7 10.1 10.2  HGB 8.2*  < > 7.5* 7.2* 7.1* 7.5* 7.1*  HCT 26.1*  < > 23.6* 22.7* 22.2* 23.7* 22.1*  PLT 488*  --  455*  --  461* 478* 469*  MCV 91.6  --  91.1  --  90.2 89.8 90.6  MCH 28.8  --  29.0  --  28.9 28.4 29.1  MCHC 31.4  --  31.8  --  32.0 31.6 32.1  RDW 15.4  --  15.6*  --  15.3 15.1 14.7  LYMPHSABS 2.3  --   --   --   --   --   --   MONOABS 1.0  --   --   --   --   --   --   EOSABS 0.0  --   --   --   --   --   --   BASOSABS 0.0  --   --   --   --   --   --   < > =  values in this interval not displayed.  Chemistries   Recent Labs Lab 04/28/15 0500 04/28/15 1430 04/29/15 0330 04/30/15 0532 04/30/15 0533 05/01/15 0553  NA 144 140 138  --  138 139  K 5.5* 4.6 4.3  --  3.5 3.5  CL 115* 110 103  --  101 101  CO2 21* 18* 24  --  25 28  GLUCOSE 105* 206* 180*  --  173* 117*  BUN 56* 60* 62*  --  70* 66*  CREATININE 3.43* 3.76* 3.87*  --  3.78* 3.42*  CALCIUM 8.3* 8.1* 8.1*  --  7.8* 8.0*  MG 1.8  --  1.7 1.6*  --   --     CBG:  Recent Labs Lab 04/30/15 0742 04/30/15 1142 04/30/15 1637 04/30/15 2153 05/01/15 0821  GLUCAP 120* 144* 201* 104* 101*    GFR Estimated Creatinine Clearance: 20.2 mL/min (by C-G formula based on Cr of 3.42).  Coagulation profile  Recent Labs Lab 04/29/15 0330 04/30/15 0532 05/01/15 0500  INR 1.21 1.11 1.15    Cardiac Enzymes  Recent Labs Lab 04/27/15 1800 04/27/15 2350  TROPONINI 1.21* 0.43*    Invalid input(s): POCBNP No results for input(s): DDIMER in the last 72 hours. No results for input(s): HGBA1C in the last 72 hours. No results for input(s): CHOL, HDL, LDLCALC, TRIG, CHOLHDL, LDLDIRECT in the last 72 hours. No results for input(s):  TSH, T4TOTAL, T3FREE, THYROIDAB in the last 72 hours.  Invalid input(s): FREET3 No results for input(s): VITAMINB12, FOLATE, FERRITIN, TIBC, IRON, RETICCTPCT in the last 72 hours. No results for input(s): LIPASE, AMYLASE in the last 72 hours.  Urine Studies No results for input(s): UHGB, CRYS in the last 72 hours.  Invalid input(s): UACOL, UAPR, USPG, UPH, UTP, UGL, UKET, UBIL, UNIT, UROB, ULEU, UEPI, UWBC, URBC, UBAC, CAST, UCOM, BILUA  MICROBIOLOGY: Recent Results (from the past 240 hour(s))  MRSA PCR Screening     Status: None   Collection Time: 04/27/15  2:19 PM  Result Value Ref Range Status   MRSA by PCR NEGATIVE NEGATIVE Final    Comment:        The GeneXpert MRSA Assay (FDA approved for NASAL specimens only), is one component of a comprehensive MRSA colonization surveillance program. It is not intended to diagnose MRSA infection nor to guide or monitor treatment for MRSA infections.     RADIOLOGY STUDIES/RESULTS: Dg Chest Port 1 View  04/28/2015   CLINICAL DATA:  Respiratory failure.  Dyspnea.  EXAM: PORTABLE CHEST - 1 VIEW  COMPARISON:  04/27/2015  FINDINGS: The endotracheal tube is 3 cm above the carina. There is a right jugular central line extending into the cavoatrial junction. There is a nasogastric tube extending into the stomach. No pneumothorax. Central and basilar alveolar opacities persist. Small pleural effusions persist bilaterally.  IMPRESSION: Support equipment appears satisfactorily positioned.  Unchanged central and basilar edema or infiltrate, and pleural effusions. This likely represents congestive heart failure.   Electronically Signed   By: Ellery Plunk M.D.   On: 04/28/2015 06:56   Dg Chest Port 1 View  04/27/2015   CLINICAL DATA:  Central line placement  EXAM: PORTABLE CHEST - 1 VIEW  COMPARISON:  04/27/2015  FINDINGS: Interval placement of right internal jugular central line. The tip is in the SVC. No pneumothorax. Endotracheal tube and NG tube  are unchanged. Left pacer is unchanged.  Cardiomegaly with vascular congestion and bilateral airspace opacities which likely reflects edema. Small bilateral effusions.  IMPRESSION: Right  central line tip in the SVC.  No pneumothorax.  Continued mild CHF.  Small bilateral effusions.   Electronically Signed   By: Charlett Nose M.D.   On: 04/27/2015 16:44   Dg Chest Portable 1 View  04/27/2015   CLINICAL DATA:  Hypoxia  EXAM: PORTABLE CHEST - 1 VIEW  COMPARISON:  April 27, 2015 study obtained earlier in the day  FINDINGS: Endotracheal tube tip is 2.8 cm above the carina. Nasogastric tube tip and side port are below the diaphragm. Pacemaker leads are attached to the right atrium right ventricle. No pneumothorax. There is stable interstitial edema. There is a small right effusion with fluid tracking into the minor fissure. Heart is mildly enlarged with pulmonary vascularity within normal limits. No adenopathy.  IMPRESSION: Tube positions as described without pneumothorax. Evidence of a degree of congestive heart failure. No change in pacemaker lead positions.   Electronically Signed   By: Bretta Bang III M.D.   On: 04/27/2015 13:13   Dg Chest Port 1 View  04/27/2015   CLINICAL DATA:  Short of breath.  Lupus.  Diabetes.  EXAM: PORTABLE CHEST - 1 VIEW  COMPARISON:  01/20/2015  FINDINGS: Cardiac enlargement. Bilateral airspace disease consistent with pulmonary edema has progressed from the prior study. Small right effusion is unchanged. Mild right lower lobe atelectasis unchanged.  AICD in unchanged position.  Congestive heart failure with edema.  IMPRESSION: No active disease.   Electronically Signed   By: Marlan Palau M.D.   On: 04/27/2015 11:18    Leroy Sea, MD  [facility] Pager:[phone]  If 7PM-7AM, please contact night-coverage [email or web address] Password [email or web address] 05/01/2015, 11:22 AM   LOS: 4 days

## 2015-05-01 NOTE — Discharge Instructions (Addendum)
Follow with Primary MD [name], MD in 7 days   Get CBC, CMP, 2 view Chest X ray checked  by Primary MD next visit.    Activity: As tolerated with Full fall precautions use walker/cane & assistance as needed   Disposition Home     Diet: Heart Healthy - renal  ,   Check your Weight same time everyday, if you gain over 2 pounds, or you develop in leg swelling, experience more shortness of breath or chest pain, call your Primary MD immediately. Follow Cardiac Low Salt Diet and 1.5 lit/day fluid restriction.   On your next visit with your primary care physician please Get Medicines reviewed and adjusted.   Please request your Prim.MD to go over all Hospital Tests and Procedure/Radiological results at the follow up, please get all Hospital records sent to your Prim MD by signing hospital release before you go home.   If you experience worsening of your admission symptoms, develop shortness of breath, life threatening emergency, suicidal or homicidal thoughts you must seek medical attention immediately by calling 911 or calling your MD immediately  if symptoms less severe.  You Must read complete instructions/literature along with all the possible adverse reactions/side effects for all the Medicines you take and that have been prescribed to you. Take any new Medicines after you have completely understood and accpet all the possible adverse reactions/side effects.   Do not drive, operating heavy machinery, perform activities at heights, swimming or participation in water activities or provide baby sitting services if your were admitted for syncope or siezures until you have seen by Primary MD or a Neurologist and advised to do so again.  Do not drive when taking Pain medications.    Do not take more than prescribed Pain, Sleep and Anxiety Medications  Special Instructions: If you have smoked or chewed Tobacco  in the last 2 yrs please stop smoking, stop any regular Alcohol  and or  any Recreational drug use.  Wear Seat belts while driving.   Please note  You were cared for by a hospitalist during your hospital stay. If you have any questions about your discharge medications or the care you received while you were in the hospital after you are discharged, you can call the unit and asked to speak with the hospitalist on call if the hospitalist that took care of you is not available. Once you are discharged, your primary care physician will handle any further medical issues. Please note that NO REFILLS for any discharge medications will be authorized once you are discharged, as it is imperative that you return to your primary care physician (or establish a relationship with a primary care physician if you do not have one) for your aftercare needs so that they can reassess your need for medications and monitor your lab values.     Information on my medicine - ELIQUIS (apixaban)  This medication education was reviewed with me or my healthcare representative as part of my discharge preparation.   Why was Eliquis prescribed for you? Eliquis was prescribed to treat blood clots that may have been found in the veins of your legs (deep vein thrombosis) or in your lungs (pulmonary embolism) and to reduce the risk of them occurring again.  What do You need to know about Eliquis ? The starting dose is 10 mg (two 5 mg tablets) taken TWICE daily for the FIRST SEVEN (7) DAYS, then on 05/08/15    the dose is reduced to ONE 5  mg tablet taken TWICE daily.  Eliquis may be taken with or without food.   Try to take the dose about the same time in the morning and in the evening. If you have difficulty swallowing the tablet whole please discuss with your pharmacist how to take the medication safely.  Take Eliquis exactly as prescribed and DO NOT stop taking Eliquis without talking to the doctor who prescribed the medication.  Stopping may increase your risk of developing a new blood clot.   Refill your prescription before you run out.  After discharge, you should have regular check-up appointments with your healthcare provider that is prescribing your Eliquis.    What do you do if you miss a dose? If a dose of ELIQUIS is not taken at the scheduled time, take it as soon as possible on the same day and twice-daily administration should be resumed. The dose should not be doubled to make up for a missed dose.  Important Safety Information A possible side effect of Eliquis is bleeding. You should call your healthcare provider right away if you experience any of the following: ? Bleeding from an injury or your nose that does not stop. ? Unusual colored urine (red or dark brown) or unusual colored stools (red or black). ? Unusual bruising for unknown reasons. ? A serious fall or if you hit your head (even if there is no bleeding).  Some medicines may interact with Eliquis and might increase your risk of bleeding or clotting while on Eliquis. To help avoid this, consult your healthcare provider or pharmacist prior to using any new prescription or non-prescription medications, including herbals, vitamins, non-steroidal anti-inflammatory drugs (NSAIDs) and supplements.  This website has more information on Eliquis (apixaban): http://www.eliquis.com/eliquis/home

## 2015-05-01 NOTE — Progress Notes (Addendum)
SUBJECTIVE:  No complaints  OBJECTIVE:   Vitals:   Filed Vitals:   04/30/15 2102 04/30/15 2358 05/01/15 0601 05/01/15 1220  BP: 131/86 133/93 129/89 129/89  Pulse: 99 97 101 102  Temp: 98.4 F (36.9 C) 98.6 F (37 C) 98.6 F (37 C)   TempSrc: Oral Oral Oral   Resp: 20 18 18    Height:      Weight:   151 lb 0.2 oz (68.5 kg)   SpO2: 100% 100% 100% 99%   I&O's:   Intake/Output Summary (Last 24 hours) at 05/01/15 1220 Last data filed at 05/01/15 0612  Gross per 24 hour  Intake 242.75 ml  Output      0 ml  Net 242.75 ml   TELEMETRY: Reviewed telemetry pt in NSR:     PHYSICAL EXAM General: Well developed, well nourished, in no acute distress Head: Eyes PERRLA, No xanthomas.   Normal cephalic and atramatic  Lungs:   Clear bilaterally to auscultation and percussion. Heart:   HRRR S1 S2 Pulses are 2+ & equal. Abdomen: Bowel sounds are positive, abdomen soft and non-tender without masses  Extremities:   No clubbing, cyanosis or edema.  DP +1 Neuro: Alert and oriented X 3. Psych:  Good affect, responds appropriately   LABS: Basic Metabolic Panel:  Recent Labs  40/98/11 0532 04/30/15 0533 05/01/15 0553 05/01/15 1045  NA  --  138 139  --   K  --  3.5 3.5  --   CL  --  101 101  --   CO2  --  25 28  --   GLUCOSE  --  173* 117*  --   BUN  --  70* 66*  --   CREATININE  --  3.78* 3.42*  --   CALCIUM  --  7.8* 8.0*  --   MG 1.6*  --   --  1.5*  PHOS  --  3.5 2.8  --    Liver Function Tests:  Recent Labs  04/30/15 0533 05/01/15 0553  ALBUMIN 2.4* 2.4*   No results for input(s): LIPASE, AMYLASE in the last 72 hours. CBC:  Recent Labs  04/30/15 0532 05/01/15 0553  WBC 10.1 10.2  HGB 7.5* 7.1*  HCT 23.7* 22.1*  MCV 89.8 90.6  PLT 478* 469*   Cardiac Enzymes: No results for input(s): CKTOTAL, CKMB, CKMBINDEX, TROPONINI in the last 72 hours. BNP: Invalid input(s): POCBNP D-Dimer: No results for input(s): DDIMER in the last 72 hours. Hemoglobin  A1C: No results for input(s): HGBA1C in the last 72 hours. Fasting Lipid Panel: No results for input(s): CHOL, HDL, LDLCALC, TRIG, CHOLHDL, LDLDIRECT in the last 72 hours. Thyroid Function Tests: No results for input(s): TSH, T4TOTAL, T3FREE, THYROIDAB in the last 72 hours.  Invalid input(s): FREET3 Anemia Panel:  Recent Labs  05/01/15 1045  RETICCTPCT 2.4   Coag Panel:   Lab Results  Component Value Date   INR 1.15 05/01/2015   INR 1.11 04/30/2015   INR 1.21 04/29/2015    RADIOLOGY: Dg Chest Port 1 View  04/28/2015   CLINICAL DATA:  Respiratory failure.  Dyspnea.  EXAM: PORTABLE CHEST - 1 VIEW  COMPARISON:  04/27/2015  FINDINGS: The endotracheal tube is 3 cm above the carina. There is a right jugular central line extending into the cavoatrial junction. There is a nasogastric tube extending into the stomach. No pneumothorax. Central and basilar alveolar opacities persist. Small pleural effusions persist bilaterally.  IMPRESSION: Support equipment appears satisfactorily positioned.  Unchanged  central and basilar edema or infiltrate, and pleural effusions. This likely represents congestive heart failure.   Electronically Signed   By: Ellery Plunk M.D.   On: 04/28/2015 06:56   Dg Chest Port 1 View  04/27/2015   CLINICAL DATA:  Central line placement  EXAM: PORTABLE CHEST - 1 VIEW  COMPARISON:  04/27/2015  FINDINGS: Interval placement of right internal jugular central line. The tip is in the SVC. No pneumothorax. Endotracheal tube and NG tube are unchanged. Left pacer is unchanged.  Cardiomegaly with vascular congestion and bilateral airspace opacities which likely reflects edema. Small bilateral effusions.  IMPRESSION: Right central line tip in the SVC.  No pneumothorax.  Continued mild CHF.  Small bilateral effusions.   Electronically Signed   By: Charlett Nose M.D.   On: 04/27/2015 16:44   Dg Chest Portable 1 View  04/27/2015   CLINICAL DATA:  Hypoxia  EXAM: PORTABLE CHEST - 1 VIEW   COMPARISON:  April 27, 2015 study obtained earlier in the day  FINDINGS: Endotracheal tube tip is 2.8 cm above the carina. Nasogastric tube tip and side port are below the diaphragm. Pacemaker leads are attached to the right atrium right ventricle. No pneumothorax. There is stable interstitial edema. There is a small right effusion with fluid tracking into the minor fissure. Heart is mildly enlarged with pulmonary vascularity within normal limits. No adenopathy.  IMPRESSION: Tube positions as described without pneumothorax. Evidence of a degree of congestive heart failure. No change in pacemaker lead positions.   Electronically Signed   By: Bretta Bang III M.D.   On: 04/27/2015 13:13   Dg Chest Port 1 View  04/27/2015   CLINICAL DATA:  Short of breath.  Lupus.  Diabetes.  EXAM: PORTABLE CHEST - 1 VIEW  COMPARISON:  01/20/2015  FINDINGS: Cardiac enlargement. Bilateral airspace disease consistent with pulmonary edema has progressed from the prior study. Small right effusion is unchanged. Mild right lower lobe atelectasis unchanged.  AICD in unchanged position.  Congestive heart failure with edema.  IMPRESSION: No active disease.   Electronically Signed   By: Marlan Palau M.D.   On: 04/27/2015 11:18   ASSESSMENT AND PLAN: 29 yo with history of SLE with lupus nephritis and CKD, prior VTE, and short QT syndrome was admitted with acute respiratory distress thought to be most likely related to PE. As part of workup, she was found to have low EF on echo.   1. Cardiomyopathy: EF 25-30% by echo this admission. 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 Her initial echo was done with HR in the 170s (sinus tachy) which may have made the EF appear to be falsely low. She is currently getting Lasix 80 mg IV daily. She denies dyspnea and does not look volume overloaded on exam but CXR shows some residual pulmonary vascular congestion.  - Repeat limited echo for EF now that HR is  considerably lower.  - Continue Coreg/Bidil. - No ACEI or spironolactone with significant CKD.  - starting Lasix 40 mg po bid today .  2. 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 She was very tachycardic at admission, RV showed moderately decreased function on initial echo.  - Will need long-term anticoagulation and now transitioning to Apixaban. Consider IVC filter given history of noncompliance.   3. CKD: Creatinine down today to 3.42 from 3.78 yesterday. Transition to PO Lasix today.  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 Needs outpatient followup with nephrology.   4. Short QT syndrome: Per patient's report. She has an ICD, not sure   5.  3.5cm exophytic right renal mass - per urology   Quintella Reichert, MD  05/01/2015  12:20 PM

## 2015-05-01 NOTE — Progress Notes (Signed)
ANTICOAGULATION CONSULT NOTE - Follow Up Consult  Pharmacy Consult for Heparin  Indication: Presumed PE  Allergies  Allergen Reactions  . Amoxicillin Rash  . Sulfa Antibiotics Rash   Patient Measurements: Height:  (149.9 cm) Weight: 151 lb 0.2 oz (68.5 kg) IBW/kg (Calculated) : 43.2  Vital Signs: Temp: 98.6 F (37 C) (08/06 2358) Temp Source: Oral (08/06 2358) BP: 133/93 mmHg (08/06 2358) Pulse Rate: 97 (08/06 2358)  Labs:  Recent Labs  04/28/15 0500  04/28/15 1430  04/29/15 0330  04/30/15 0532 04/30/15 0533 04/30/15 1520 04/30/15 2355  HGB 7.5*  --  7.2*  --  7.1*  --  7.5*  --   --   --   HCT 23.6*  --  22.7*  --  22.2*  --  23.7*  --   --   --   PLT 455*  --   --   --  461*  --  478*  --   --   --   LABPROT  --   --   --   --  15.4*  --  14.5  --   --   --   INR  --   --   --   --  1.21  --  1.11  --   --   --   HEPARINUNFRC 0.67  < >  --   < >  --   < > 0.22*  --  0.25* 0.32  CREATININE 3.43*  --  3.76*  --  3.87*  --   --  3.78*  --   --   < > = values in this interval not displayed.  Estimated Creatinine Clearance: 18.3 mL/min (by C-G formula based on Cr of 3.78).  Assessment: Heparin level at low end of therapeutic range in setting of presumed PE, will increase slightly to prevent sub-therapeutic level   Goal of Therapy:  Heparin level 0.3-0.7 units/ml Monitor platelets by anticoagulation protocol: Yes   Plan:  -Increase heparin to 1150 units/hr -0900 HL -Daily CBC/HL -Monitor for bleeding  Abran Duke 05/01/2015,12:53 AM

## 2015-05-01 NOTE — Progress Notes (Signed)
  Echocardiogram 2D Echocardiogram has been performed.  Paige Aguilar 05/01/2015, 3:25 PM

## 2015-05-01 NOTE — Progress Notes (Signed)
ANTICOAGULATION CONSULT NOTE - Follow Up Consult  Pharmacy Consult for apixaban Indication: pulmonary embolus  Allergies  Allergen Reactions  . Amoxicillin Rash  . Sulfa Antibiotics Rash    Patient Measurements: Height:  (149.9 cm) Weight: 151 lb 0.2 oz (68.5 kg) IBW/kg (Calculated) : 43.2   Vital Signs: Temp: 98.6 F (37 C) (08/07 0601) Temp Source: Oral (08/07 0601) BP: 129/89 mmHg (08/07 0601) Pulse Rate: 101 (08/07 0601)  Labs:  Recent Labs  04/29/15 0330  04/30/15 0532 04/30/15 0533 04/30/15 1520 04/30/15 2355 05/01/15 0500 05/01/15 0553  HGB 7.1*  --  7.5*  --   --   --   --  7.1*  HCT 22.2*  --  23.7*  --   --   --   --  22.1*  PLT 461*  --  478*  --   --   --   --  469*  LABPROT 15.4*  --  14.5  --   --   --  14.9  --   INR 1.21  --  1.11  --   --   --  1.15  --   HEPARINUNFRC  --   < > 0.22*  --  0.25* 0.32  --   --   CREATININE 3.87*  --   --  3.78*  --   --   --  3.42*  < > = values in this interval not displayed.  Estimated Creatinine Clearance: 20.2 mL/min (by C-G formula based on Cr of 3.42).  Assessment: 29 yo female with suspected PE (noted with DVT about 2 weeks ago and history of PE). Previously on heparin/warfarin overlap for 2 days.  Pharmacy to dose apixaban for PE. Coumadin has been cancelled and will stop heparin once apixaban has been given. H/H low but stable, Plts stable, no s/sx of bleeding noted.  Goal of Therapy:  Appropriate dosing Monitor platelets by anticoagulation protocol: Yes   Plan:  - Will start apixaban  BID x 7 days, then change to  BID on 05/08/15. - Stop heparin once Eliquis dose has been given - Monitor CBC, s/sx of bleeding  Casilda Carls, PharmD. Clinical Pharmacist Resident Pager: [phone]

## 2015-05-01 NOTE — Progress Notes (Signed)
Notified Kyazimova, PA that pt is having headache rating it a 9. Pt requesting pain medication. Will continue to monitor pt. Jerrye Bushy

## 2015-05-02 LAB — CBC
HEMATOCRIT: 22.9 % — AB (ref 36.0–46.0)
Hemoglobin: 7.3 g/dL — ABNORMAL LOW (ref 12.0–15.0)
MCH: 29.2 pg (ref 26.0–34.0)
MCHC: 31.9 g/dL (ref 30.0–36.0)
MCV: 91.6 fL (ref 78.0–100.0)
Platelets: 461 10*3/uL — ABNORMAL HIGH (ref 150–400)
RBC: 2.5 MIL/uL — ABNORMAL LOW (ref 3.87–5.11)
RDW: 14.9 % (ref 11.5–15.5)
WBC: 9.4 10*3/uL (ref 4.0–10.5)

## 2015-05-02 LAB — BASIC METABOLIC PANEL
Anion gap: 10 (ref 5–15)
BUN: 69 mg/dL — ABNORMAL HIGH (ref 6–20)
CALCIUM: 8.4 mg/dL — AB (ref 8.9–10.3)
CO2: 30 mmol/L (ref 22–32)
Chloride: 99 mmol/L — ABNORMAL LOW (ref 101–111)
Creatinine, Ser: 3.57 mg/dL — ABNORMAL HIGH (ref 0.44–1.00)
GFR calc non Af Amer: 16 mL/min — ABNORMAL LOW (ref >60)
GFR, EST AFRICAN AMERICAN: 19 mL/min — AB (ref >60)
GLUCOSE: 88 mg/dL (ref 65–99)
POTASSIUM: 3.3 mmol/L — AB (ref 3.5–5.1)
Sodium: 139 mmol/L (ref 135–145)

## 2015-05-02 LAB — GLUCOSE, CAPILLARY: Glucose-Capillary: 87 mg/dL (ref 65–99)

## 2015-05-02 MED ORDER — SODIUM CHLORIDE 0.9 % IJ SOLN
10.0000 mL | INTRAMUSCULAR | Status: DC | PRN
  Administered 2015-05-02: 30 mL

## 2015-05-02 MED ORDER — CARVEDILOL 12.5 MG PO TABS
12.5000 mg | ORAL_TABLET | Freq: Two times a day (BID) | ORAL | Status: DC
  Administered 2015-05-02: 12.5 mg via ORAL
  Filled 2015-05-02: qty 1

## 2015-05-02 MED ORDER — APIXABAN 5 MG PO TABS: 10.0000 mg | ORAL_TABLET | Freq: Two times a day (BID) | ORAL | Status: DC

## 2015-05-02 MED ORDER — APIXABAN 5 MG PO TABS: 5.0000 mg | ORAL_TABLET | Freq: Two times a day (BID) | ORAL | Status: DC

## 2015-05-02 MED ORDER — CARVEDILOL 12.5 MG PO TABS: 12.5000 mg | ORAL_TABLET | Freq: Two times a day (BID) | ORAL | Status: AC

## 2015-05-02 MED ORDER — ISOSORB DINITRATE-HYDRALAZINE 20-37.5 MG PO TABS: 1.0000 | ORAL_TABLET | Freq: Three times a day (TID) | ORAL | Status: DC

## 2015-05-02 MED ORDER — POTASSIUM CHLORIDE CRYS ER 20 MEQ PO TBCR
40.0000 meq | EXTENDED_RELEASE_TABLET | ORAL | Status: AC
  Administered 2015-05-02 (×2): 40 meq via ORAL
  Filled 2015-05-02 (×2): qty 2

## 2015-05-02 MED ORDER — FUROSEMIDE 20 MG PO TABS: 20.0000 mg | ORAL_TABLET | Freq: Every day | ORAL | Status: DC

## 2015-05-02 MED ORDER — FUROSEMIDE 40 MG PO TABS: 40.0000 mg | ORAL_TABLET | Freq: Every day | ORAL | Status: DC

## 2015-05-02 NOTE — Progress Notes (Signed)
Patient Name: Paige Aguilar Date of Encounter: 05/02/2015     Active Problems:   Chest pain   AKI (acute kidney injury)   Respiratory failure with hypoxia   Encounter for intravenous line placement   DCM (dilated cardiomyopathy)   Acute systolic CHF (congestive heart failure)    SUBJECTIVE  The patient denies any chest pain or shortness of breath.  She is anxious to go home so that she can sign her at lease for her apartment.  CURRENT MEDS . antiseptic oral rinse  7 mL Mouth Rinse BID  . apixaban  10 mg Oral BID  . [START ON 05/08/2015] apixaban  5 mg Oral BID  . carvedilol  12.5 mg Oral BID WC  . [START ON 05/03/2015] furosemide  40 mg Oral Daily  . insulin aspart  0-15 Units Subcutaneous TID AC & HS  . isosorbide-hydrALAZINE  1 tablet Oral TID  . pantoprazole  40 mg Oral Daily  . polyethylene glycol  17 g Oral Daily  . potassium chloride  40 mEq Oral Q4H  . predniSONE  20 mg Oral Q breakfast    OBJECTIVE  Filed Vitals:   05/01/15 1220 05/01/15 1722 05/01/15 2348 05/02/15 0602  BP: 129/89 125/85 132/98 131/94  Pulse: 102 101 97 97  Temp:   98.6 F (37 C) 98.6 F (37 C)  TempSrc:   Oral Oral  Resp:   18 18  Height:      Weight:      SpO2: 99%  100% 98%    Intake/Output Summary (Last 24 hours) at 05/02/15 0757 Last data filed at 05/01/15 1554  Gross per 24 hour  Intake    222 ml  Output      0 ml  Net    222 ml   Filed Weights   04/29/15 0330 04/30/15 0516 05/01/15 0601  Weight: 156 lb 8.4 oz (71 kg) 151 lb 0.2 oz (68.5 kg) 151 lb 0.2 oz (68.5 kg)    PHYSICAL EXAM  General: Pleasant, NAD. Neuro: Alert and oriented X 3. Moves all extremities spontaneously. Psych: Normal affect. HEENT:  Normal  Neck: Supple without bruits or JVD. Lungs:  Resp regular and unlabored, CTA. Heart: RRR no s3, s4, or murmurs. Abdomen: Soft, non-tender, non-distended, BS + x 4.  Extremities: No clubbing, cyanosis or edema. DP/PT/Radials 2+ and equal  bilaterally.  Accessory Clinical Findings  CBC  Recent Labs  05/01/15 0553 05/02/15 0435  WBC 10.2 9.4  HGB 7.1* 7.3*  HCT 22.1* 22.9*  MCV 90.6 91.6  PLT 469* 461*   Basic Metabolic Panel  Recent Labs  04/30/15 0532  04/30/15 0533 05/01/15 0553 05/01/15 1045 05/02/15 0435  NA  --   < > 138 139  --  139  K  --   < > 3.5 3.5  --  3.3*  CL  --   < > 101 101  --  99*  CO2  --   < > 25 28  --  30  GLUCOSE  --   < > 173* 117*  --  88  BUN  --   < > 70* 66*  --  69*  CREATININE  --   < > 3.78* 3.42*  --  3.57*  CALCIUM  --   < > 7.8* 8.0*  --  8.4*  MG 1.6*  --   --   --  1.5*  --   PHOS  --   --  3.5 2.8  --   --   < > =  values in this interval not displayed. Liver Function Tests  Recent Labs  04/30/15 0533 05/01/15 0553  ALBUMIN 2.4* 2.4*   No results for input(s): LIPASE, AMYLASE in the last 72 hours. Cardiac Enzymes No results for input(s): CKTOTAL, CKMB, CKMBINDEX, TROPONINI in the last 72 hours. BNP Invalid input(s): POCBNP D-Dimer No results for input(s): DDIMER in the last 72 hours. Hemoglobin A1C No results for input(s): HGBA1C in the last 72 hours. Fasting Lipid Panel No results for input(s): CHOL, HDL, LDLCALC, TRIG, CHOLHDL, LDLDIRECT in the last 72 hours. Thyroid Function Tests No results for input(s): TSH, T4TOTAL, T3FREE, THYROIDAB in the last 72 hours.  Invalid input(s): FREET3  TELE  Sinus tachycardia 105/min  ECG    Radiology/Studies  Dg Chest Port 1 View  04/28/2015   CLINICAL DATA:  Respiratory failure.  Dyspnea.  EXAM: PORTABLE CHEST - 1 VIEW  COMPARISON:  04/27/2015  FINDINGS: The endotracheal tube is 3 cm above the carina. There is a right jugular central line extending into the cavoatrial junction. There is a nasogastric tube extending into the stomach. No pneumothorax. Central and basilar alveolar opacities persist. Small pleural effusions persist bilaterally.  IMPRESSION: Support equipment appears satisfactorily positioned.   Unchanged central and basilar edema or infiltrate, and pleural effusions. This likely represents congestive heart failure.   Electronically Signed   By: Ellery Plunk M.D.   On: 04/28/2015 06:56   Dg Chest Port 1 View  04/27/2015   CLINICAL DATA:  Central line placement  EXAM: PORTABLE CHEST - 1 VIEW  COMPARISON:  04/27/2015  FINDINGS: Interval placement of right internal jugular central line. The tip is in the SVC. No pneumothorax. Endotracheal tube and NG tube are unchanged. Left pacer is unchanged.  Cardiomegaly with vascular congestion and bilateral airspace opacities which likely reflects edema. Small bilateral effusions.  IMPRESSION: Right central line tip in the SVC.  No pneumothorax.  Continued mild CHF.  Small bilateral effusions.   Electronically Signed   By: Charlett Nose M.D.   On: 04/27/2015 16:44   Dg Chest Portable 1 View  04/27/2015   CLINICAL DATA:  Hypoxia  EXAM: PORTABLE CHEST - 1 VIEW  COMPARISON:  April 27, 2015 study obtained earlier in the day  FINDINGS: Endotracheal tube tip is 2.8 cm above the carina. Nasogastric tube tip and side port are below the diaphragm. Pacemaker leads are attached to the right atrium right ventricle. No pneumothorax. There is stable interstitial edema. There is a small right effusion with fluid tracking into the minor fissure. Heart is mildly enlarged with pulmonary vascularity within normal limits. No adenopathy.  IMPRESSION: Tube positions as described without pneumothorax. Evidence of a degree of congestive heart failure. No change in pacemaker lead positions.   Electronically Signed   By: Bretta Bang III M.D.   On: 04/27/2015 13:13   Dg Chest Port 1 View  04/27/2015   CLINICAL DATA:  Short of breath.  Lupus.  Diabetes.  EXAM: PORTABLE CHEST - 1 VIEW  COMPARISON:  01/20/2015  FINDINGS: Cardiac enlargement. Bilateral airspace disease consistent with pulmonary edema has progressed from the prior study. Small right effusion is unchanged. Mild right  lower lobe atelectasis unchanged.  AICD in unchanged position.  Congestive heart failure with edema.  IMPRESSION: No active disease.   Electronically Signed   By: Marlan Palau M.D.   On: 04/27/2015 11:18    ASSESSMENT AND PLAN 1.  Dilated cardiomyopathy.  Presumably nonischemic related to her SLE.  Repeat echocardiogram yesterday again  shows ejection fraction 25-30%.  Continue carvedilol and BiDil.  She has an ICD in place which was inserted while she was living in [place]. 2. 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 She was very tachycardic at admission, RV showed moderately decreased function on initial echo.  - Will need long-term anticoagulation and now transitioning to Apixaban. Consider IVC filter given history of noncompliance.  3.  Chronic kidney disease.  Creatinine slightly higher today.  On oral Lasix 4.  Anemia of chronic disease  Plan: I will increase her carvedilol to 12.5 mg twice a day.  At discharge she will need follow-up with EP for her defibrillator as well as with general cardiology. Signed, Cassell Clement MD

## 2015-05-02 NOTE — Discharge Summary (Signed)
Paige Aguilar, is a 29 y.o. female  DOB [birth year]  MRN [id number].  Admission date:  04/27/2015  Admitting Physician  [name], MD  Discharge Date:  05/02/2015   Primary MD  [name], MD  Recommendations for primary care physician for things to follow:   Monitor weight, BMP closely. She must follow with cardiology, urology and renal within 1-2 weeks post discharge.   Admission Diagnosis  lupus   Discharge Diagnosis  lupus     Active Problems:   Chest pain   AKI (acute kidney injury)   Respiratory failure with hypoxia   Encounter for intravenous line placement   DCM (dilated cardiomyopathy)   Acute systolic CHF (congestive heart failure)      Past Medical History  Diagnosis Date  . Lupus   . Arthritis   . Thyroid disease   . Diabetes mellitus without complication     Past Surgical History  Procedure Laterality Date  . Pacemaker insertion    . Cesarean section         HPI :    29 year old African female with history of lupus on chronic prednisone, venous thromboembolism not compliant to anticoagulation, chronic kidney disease who was admitted by PCCM on 8/3 with acute respiratory failure and required emergent intubation and mechanical ventilation. This was felt to be multifactorial from presumed pulmonary embolism, acute systolic heart failure, and possibly benzodiazepine abuse. She was diuresed, started on anticoagulation and extubated on 8/4. 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 After stabilization, she was transferred to telemetry unit-and hospitalist service assumed care on 8/6.     Hospital Course:    Respiratory  failure with hypoxia: Multifactorial from suspected pulmonary embolism (given noncompliance), acute systolic heart failure and possibly benzodiazepine overuse along with possible PE. Intubated on admission, after initiation of diuretics and IV heparin successfully extubated on 8/4. Doing well postextubation and now on room air.  Presumed pulmonary embolism: Noncompliant with anticoagulation which was Eliquis at home, was on IV heparin and overlapping Coumadin. No RV strain on echocardiogram, suspect definite diagnosis of pulmonary embolism would not change management. Transition to Eliquis on 05/01/2015 counseled on compliance, case management requested to provide assistance with Eliquis.  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 Has AICD. Echocardiogram on admission shows EF of 25-30%, new when compared to prior study in April 2016. Continue Lasix at lower dose along with BiDil and Coreg, cleared by cardiology for discharge. Not a candidate for ACEI/ARB-given her renal failure. Have cutdown her Lasix from 40 twice a day to 20 once a day, PCP to monitor BMP weight and diuretic dose closely, will require outpatient cardiology follow-up post discharge.  Benzodiazepine abuse: Claims she buys Xanax off the street-not sure how many milligrams she uses on a daily basis, stable. Counseled not to abuse recreational or prescription medications.  Essential hypertension:  Continue clonidine, add low-dose Coreg and titrate accordingly.  Acute on Chronic kidney disease stage IV: Baseline creatinine close to 3, mild ARF. 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 Renal function stable with supportive care and steroids. Have cutdown her Lasix from 40 twice a day to 20 once a day as she is close to being compensated, request PCP to monitor weight and BMP closely. Outpatient renal follow-up post  discharge.   History of lupus: On 10 mg of prednisone since fish to oral prednisone from IV hydrocortisone on 05/01/2015 Will gently taper down to 10 mg.   Anemia: Recent iron panel in April 2016 consistent with chronic disease. Suspect related to see daily/lupus-now slightly worse than usual baseline because of acute illness. Continue to follow and transfuse if needed.  History of prolonged QTC syndrome with syncope:per patient she required AICD implantation in 2014 at [facility] in [place].Never shocked.  ? History of hypothyroidism: However TSH normal. Follow with PCP.  3.5 cm exophytic right renal mass: Unfortunately unable to do MRI given pacemaker/defibrillator in place, unable to do a CT with contrast given CKD. Have her follow with urology in the outpatient setting within a week.  Incidental finding of old ruptured Baker's cyst. Kindly see vascular ultrasound report below, supportive care and no acute issues. Asymptomatic.       Discharge Condition: Fair  Follow UP  Follow-up Information    Follow up with [facility] Schedule an appointment as soon as possible for a visit in 1 week.   Why:  Right renal mass   Contact information:   [address] [place] [address] [phone]       Follow up with [facility] [address] Schedule an appointment as soon as possible for a visit in 1 week.   Why:  CHF   Contact information:   [address] [place] [address]       Follow up with [name], MD. Schedule an appointment as soon as possible for a visit in 1 week.   Specialty:  Family Medicine   Contact information:   [address] [place] [address] [phone]       Follow up with [facility]. Schedule an appointment as soon as possible for a visit in 1 week.   Why:  Lupus nephritis   Contact information:   [address] [place] [address] [phone]          Consults obtained - Cards, PCCM  Diet and Activity recommendation: See Discharge Instructions below  Discharge Instructions       Discharge Instructions    Diet - low sodium heart healthy    Complete by:  As directed      Discharge instructions    Complete by:  As directed   Follow with Primary MD [name], MD in 7 days   Get CBC, CMP, 2 view Chest X ray checked  by Primary MD next visit.    Activity: As tolerated with Full fall precautions use walker/cane & assistance as needed   Disposition Home     Diet: Heart Healthy - renal  ,   Check your Weight same time everyday, if you gain over 2 pounds, or you develop in leg swelling, experience more shortness of breath or chest pain, call your Primary MD immediately. Follow Cardiac Low Salt Diet  and 1.5 lit/day fluid restriction.   On your next visit with your primary care physician please Get Medicines reviewed and adjusted.   Please request your Prim.MD to go over all Hospital Tests and Procedure/Radiological results at the follow up, please get all Hospital records sent to your Prim MD by signing hospital release before you go home.   If you experience worsening of your admission symptoms, develop shortness of breath, life threatening emergency, suicidal or homicidal thoughts you must seek medical attention immediately by calling 911 or calling your MD immediately  if symptoms less severe.  You Must read complete instructions/literature along with all the possible adverse reactions/side effects for all the Medicines you take and that have been prescribed to you. Take any new Medicines after you have completely understood and accpet all the possible adverse reactions/side effects.   Do not drive, operating heavy machinery, perform activities at heights, swimming or participation in water activities or provide baby sitting services if your were admitted for syncope or siezures until you have seen by Primary MD or a  Neurologist and advised to do so again.  Do not drive when taking Pain medications.    Do not take more than prescribed Pain, Sleep and Anxiety Medications  Special Instructions: If you have smoked or chewed Tobacco  in the last 2 yrs please stop smoking, stop any regular Alcohol  and or any Recreational drug use.  Wear Seat belts while driving.   Please note  You were cared for by a hospitalist during your hospital stay. If you have any questions about your discharge medications or the care you received while you were in the hospital after you are discharged, you can call the unit and asked to speak with the hospitalist on call if the hospitalist that took care of you is not available. Once you are discharged, your primary care physician will handle any further medical issues. Please note that NO REFILLS for any discharge medications will be authorized once you are discharged, as it is imperative that you return to your primary care physician (or establish a relationship with a primary care physician if you do not have one) for your aftercare needs so that they can reassess your need for medications and Follow with Primary MD [name], MD in 7 days   Get CBC, CMP, 2 view Chest X ray checked  by Primary MD next visit.    Activity: As tolerated with Full fall precautions use walker/cane & assistance as needed   Disposition Home     Diet: Heart Healthy  Check your Weight same time everyday, if you gain over 2 pounds, or you develop in leg swelling, experience more shortness of breath or chest pain, call your Primary MD immediately. Follow Cardiac Low Salt Diet and 1.5 lit/day fluid restriction.   On your next visit with your primary care physician please Get Medicines reviewed and adjusted.   Please request your Prim.MD to go over all Hospital Tests and Procedure/Radiological results at the follow up, please get all Hospital records sent to your Prim MD by signing hospital  release before you go home.   If you experience worsening of your admission symptoms, develop shortness of breath, life threatening emergency, suicidal or homicidal thoughts you must seek medical attention immediately by calling 911 or calling your MD immediately  if symptoms less severe.  You Must read complete instructions/literature along with all the possible adverse reactions/side effects for all the Medicines you take and that have been  prescribed to you. Take any new Medicines after you have completely understood and accpet all the possible adverse reactions/side effects.   Do not drive, operating heavy machinery, perform activities at heights, swimming or participation in water activities or provide baby sitting services if your were admitted for syncope or siezures until you have seen by Primary MD or a Neurologist and advised to do so again.  Do not drive when taking Pain medications.    Do not take more than prescribed Pain, Sleep and Anxiety Medications  Special Instructions: If you have smoked or chewed Tobacco  in the last 2 yrs please stop smoking, stop any regular Alcohol  and or any Recreational drug use.  Wear Seat belts while driving.   Please note  You were cared for by a hospitalist during your hospital stay. If you have any questions about your discharge medications or the care you received while you were in the hospital after you are discharged, you can call the unit and asked to speak with the hospitalist on call if the hospitalist that took care of you is not available. Once you are discharged, your primary care physician will handle any further medical issues. Please note that NO REFILLS for any discharge medications will be authorized once you are discharged, as it is imperative that you return to your primary care physician (or establish a relationship with a primary care physician if you do not have one) for your aftercare needs so that they can reassess your need  for medications and monitor your lab values.     Increase activity slowly    Complete by:  As directed              Discharge Medications       Medication List    STOP taking these medications        oxycodone 30 MG immediate release tablet  Commonly known as:  ROXICODONE      TAKE these medications        apixaban 5 MG Tabs tablet  Commonly known as:  ELIQUIS  Take 2 tablets (10 mg total) by mouth 2 (two) times daily.     apixaban 5 MG Tabs tablet  Commonly known as:  ELIQUIS  Take 1 tablet (5 mg total) by mouth 2 (two) times daily.  Start taking on:  05/08/2015     carvedilol 12.5 MG tablet  Commonly known as:  COREG  Take 1 tablet (12.5 mg total) by mouth 2 (two) times daily with a meal.     furosemide 20 MG tablet  Commonly known as:  LASIX  Take 1 tablet (20 mg total) by mouth daily.     isosorbide-hydrALAZINE 20-37.5 MG per tablet  Commonly known as:  BIDIL  Take 1 tablet by mouth 3 (three) times daily.     predniSONE 10 MG tablet  Commonly known as:  DELTASONE  Take 10 mg by mouth daily with breakfast.     QUEtiapine 50 MG tablet  Commonly known as:  SEROQUEL  Take 50 mg by mouth at bedtime.        Major procedures and Radiology Reports - PLEASE review detailed and final reports for all details, in brief -   ETT 8/3>>8/4 TTE 8/3 Central Line 8/3>> will DC 8/7  TTE  - Left ventricle: The cavity size was mildly dilated. Wall thickness was normal. Systolic function was severely reduced. The estimated ejection fraction was in the range of 25% to 30%. Severe diffuse hypokinesis  with no identifiable regional variations. Doppler parameters are consistent with abnormal left ventricular relaxation (grade 1 diastolic dysfunction). No evidence of thrombus. - Mitral valve: There was mild regurgitation. - Pericardium, extracardiac: A trivial pericardial effusion was identified.   Leg Vas.[place]  - No evidence of deep vein thrombosis  involving the right lower extremity. - Findings consistent with a small chronic deep vein thrombosis involving the mid femoral vein of the left lower extremity. - No evidence of a superficial thrombus of the right and left lower extremity. - Incidental findings are consistent with: A ruptured Baker&'s Cyst on the left with fluid coursing 6.02 cm from the popliteal fossa into the calf. - No evidence of Baker&'s cyst on the right   Dg Chest Port 1 View  04/28/2015   CLINICAL DATA:  Respiratory failure.  Dyspnea.  EXAM: PORTABLE CHEST - 1 VIEW  COMPARISON:  04/27/2015  FINDINGS: The endotracheal tube is 3 cm above the carina. There is a right jugular central line extending into the cavoatrial junction. There is a nasogastric tube extending into the stomach. No pneumothorax. Central and basilar alveolar opacities persist. Small pleural effusions persist bilaterally.  IMPRESSION: Support equipment appears satisfactorily positioned.  Unchanged central and basilar edema or infiltrate, and pleural effusions. This likely represents congestive heart failure.   Electronically Signed   By: Ellery Plunk M.D.   On: 04/28/2015 06:56   Dg Chest Port 1 View  04/27/2015   CLINICAL DATA:  Central line placement  EXAM: PORTABLE CHEST - 1 VIEW  COMPARISON:  04/27/2015  FINDINGS: Interval placement of right internal jugular central line. The tip is in the SVC. No pneumothorax. Endotracheal tube and NG tube are unchanged. Left pacer is unchanged.  Cardiomegaly with vascular congestion and bilateral airspace opacities which likely reflects edema. Small bilateral effusions.  IMPRESSION: Right central line tip in the SVC.  No pneumothorax.  Continued mild CHF.  Small bilateral effusions.   Electronically Signed   By: Charlett Nose M.D.   On: 04/27/2015 16:44   Dg Chest Portable 1 View  04/27/2015   CLINICAL DATA:  Hypoxia  EXAM: PORTABLE CHEST - 1 VIEW  COMPARISON:  April 27, 2015 study obtained earlier in the  day  FINDINGS: Endotracheal tube tip is 2.8 cm above the carina. Nasogastric tube tip and side port are below the diaphragm. Pacemaker leads are attached to the right atrium right ventricle. No pneumothorax. There is stable interstitial edema. There is a small right effusion with fluid tracking into the minor fissure. Heart is mildly enlarged with pulmonary vascularity within normal limits. No adenopathy.  IMPRESSION: Tube positions as described without pneumothorax. Evidence of a degree of congestive heart failure. No change in pacemaker lead positions.   Electronically Signed   By: Bretta Bang III M.D.   On: 04/27/2015 13:13   Dg Chest Port 1 View  04/27/2015   CLINICAL DATA:  Short of breath.  Lupus.  Diabetes.  EXAM: PORTABLE CHEST - 1 VIEW  COMPARISON:  01/20/2015  FINDINGS: Cardiac enlargement. Bilateral airspace disease consistent with pulmonary edema has progressed from the prior study. Small right effusion is unchanged. Mild right lower lobe atelectasis unchanged.  AICD in unchanged position.  Congestive heart failure with edema.  IMPRESSION: No active disease.   Electronically Signed   By: Marlan Palau M.D.   On: 04/27/2015 11:18    Micro Results      Recent Results (from the past 240 hour(s))  MRSA PCR Screening  Status: None   Collection Time: 04/27/15  2:19 PM  Result Value Ref Range Status   MRSA by PCR NEGATIVE NEGATIVE Final    Comment:        The GeneXpert MRSA Assay (FDA approved for NASAL specimens only), is one component of a comprehensive MRSA colonization surveillance program. It is not intended to diagnose MRSA infection nor to guide or monitor treatment for MRSA infections.        Today   Subjective    Marialice Newkirk today has no headache,no chest abdominal pain,no new weakness tingling or numbness, feels much better wants to go home today.    Objective   Blood pressure 135/98, pulse 97, temperature 98.6 F (37 C), temperature source Oral,  resp. rate 18, height  (1.499 m), weight 68.5 kg (151 lb 0.2 oz), last menstrual period 04/27/2015, SpO2 98 %.   Intake/Output Summary (Last 24 hours) at 05/02/15 1026 Last data filed at 05/02/15 0900  Gross per 24 hour  Intake    342 ml  Output      0 ml  Net    342 ml    Exam Awake Alert, Oriented x 3, No new F.N deficits, Normal affect [place].AT,PERRAL Supple Neck,No JVD, No cervical lymphadenopathy appriciated.  Symmetrical Chest wall movement, Good air movement bilaterally, CTAB RRR,No Gallops,Rubs or new Murmurs, No Parasternal Heave +ve B.Sounds, Abd Soft, Non tender, No organomegaly appriciated, No rebound -guarding or rigidity. No Cyanosis, Clubbing or edema, No new Rash or bruise   Data Review   CBC w Diff: Lab Results  Component Value Date   WBC 9.4 05/02/2015   HGB 7.3* 05/02/2015   HCT 22.9* 05/02/2015   PLT 461* 05/02/2015   LYMPHOPCT 15 04/27/2015   MONOPCT 7 04/27/2015   EOSPCT 0 04/27/2015   BASOPCT 0 04/27/2015    CMP: Lab Results  Component Value Date   NA 139 05/02/2015   K 3.3* 05/02/2015   CL 99* 05/02/2015   CO2 30 05/02/2015   BUN 69* 05/02/2015   CREATININE 3.57* 05/02/2015   PROT 4.9* 01/19/2015   ALBUMIN 2.4* 05/01/2015   BILITOT 0.3 01/19/2015   ALKPHOS 65 01/19/2015   AST 25 01/19/2015   ALT 22 01/19/2015  .   Total Time in preparing paper work, data evaluation and todays exam - 35 minutes  Leroy Sea M.D on 05/02/2015 at 10:26 AM  [facility]   Office  [phone]

## 2015-05-02 NOTE — Care Management Note (Signed)
Case Management Note  Patient Details  Name: Ayelet Gruenewald MRN: [id number] Date of Birth: 09-Sep-1986  Subjective/Objective:   29 y.o. F who will be following up with Ronne Binning, MD who is her PCP. I have given her A 30 Day coupon for Eloquis and she understands that she will need to f/u with PCP to continue this prescription at discounted rate. No further discharge needs.                  Action/Plan:    Expected Discharge Date:                  Expected Discharge Plan:  Home/Self Care  In-House Referral:     Discharge planning Services  CM Consult, Medication Assistance (Eloquis 30 Day free)  Post Acute Care Choice:    Choice offered to:     DME Arranged:    DME Agency:     HH Arranged:    HH Agency:     Status of Service:  Completed, signed off  Medicare Important Message Given:    Date Medicare IM Given:    Medicare IM give by:    Date Additional Medicare IM Given:    Additional Medicare Important Message give by:     If discussed at Long Length of Stay Meetings, dates discussed:    Additional Comments:  Yvone Neu, RN 05/02/2015, 6:03 PM

## 2015-05-02 NOTE — Progress Notes (Signed)
Discharge teaching given to pt including activity, diet, medications, and follow-up appts. Pt verbalized understanding of all discharge instructions. IV access was d/c'd. Vitals are stable. Skin is intact except as charted in most recent assessments. Pt to be escorted out by NT, to be driven home by family.

## 2015-05-18 ENCOUNTER — Emergency Department (HOSPITAL_COMMUNITY)
Admission: EM | Admit: 2015-05-18 | Discharge: 2015-05-19 | Disposition: A | Discharge status: Home / Self Care | Payer: Medicaid Other | Attending: Emergency Medicine | Admitting: Emergency Medicine

## 2015-05-18 ENCOUNTER — Encounter (HOSPITAL_COMMUNITY): Payer: Self-pay | Admitting: Emergency Medicine

## 2015-05-18 DIAGNOSIS — Z8739 Personal history of other diseases of the musculoskeletal system and connective tissue: Secondary | ICD-10-CM | POA: Insufficient documentation

## 2015-05-18 DIAGNOSIS — Z86711 Personal history of pulmonary embolism: Secondary | ICD-10-CM | POA: Diagnosis not present

## 2015-05-18 DIAGNOSIS — E119 Type 2 diabetes mellitus without complications: Secondary | ICD-10-CM | POA: Diagnosis not present

## 2015-05-18 DIAGNOSIS — Y9389 Activity, other specified: Secondary | ICD-10-CM | POA: Insufficient documentation

## 2015-05-18 DIAGNOSIS — T7840XA Allergy, unspecified, initial encounter: Secondary | ICD-10-CM | POA: Diagnosis present

## 2015-05-18 DIAGNOSIS — Z880 Allergy status to penicillin: Secondary | ICD-10-CM | POA: Insufficient documentation

## 2015-05-18 DIAGNOSIS — Y998 Other external cause status: Secondary | ICD-10-CM | POA: Insufficient documentation

## 2015-05-18 DIAGNOSIS — Z3202 Encounter for pregnancy test, result negative: Secondary | ICD-10-CM | POA: Diagnosis not present

## 2015-05-18 DIAGNOSIS — Z86718 Personal history of other venous thrombosis and embolism: Secondary | ICD-10-CM | POA: Insufficient documentation

## 2015-05-18 DIAGNOSIS — Y9289 Other specified places as the place of occurrence of the external cause: Secondary | ICD-10-CM | POA: Insufficient documentation

## 2015-05-18 DIAGNOSIS — X58XXXA Exposure to other specified factors, initial encounter: Secondary | ICD-10-CM | POA: Insufficient documentation

## 2015-05-18 DIAGNOSIS — T783XXA Angioneurotic edema, initial encounter: Secondary | ICD-10-CM | POA: Diagnosis not present

## 2015-05-18 DIAGNOSIS — N390 Urinary tract infection, site not specified: Secondary | ICD-10-CM | POA: Insufficient documentation

## 2015-05-18 HISTORY — DX: Other pulmonary embolism without acute cor pulmonale: I26.99

## 2015-05-18 HISTORY — DX: Acute embolism and thrombosis of unspecified deep veins of unspecified lower extremity: I82.409

## 2015-05-18 LAB — URINALYSIS, ROUTINE W REFLEX MICROSCOPIC
Bilirubin Urine: NEGATIVE
Glucose, UA: NEGATIVE mg/dL
Hgb urine dipstick: NEGATIVE
Ketones, ur: NEGATIVE mg/dL
Nitrite: NEGATIVE
Protein, ur: 100 mg/dL — AB
Specific Gravity, Urine: 1.009 (ref 1.005–1.030)
Urobilinogen, UA: 0.2 mg/dL (ref 0.0–1.0)
pH: 6 (ref 5.0–8.0)

## 2015-05-18 LAB — POC URINE PREG, ED: Preg Test, Ur: NEGATIVE

## 2015-05-18 LAB — URINE MICROSCOPIC-ADD ON

## 2015-05-18 MED ORDER — RANITIDINE HCL 150 MG/10ML PO SYRP: 150.0000 mg | ORAL_SOLUTION | Freq: Once | ORAL | Status: DC

## 2015-05-18 MED ORDER — SODIUM CHLORIDE 0.9 % IV BOLUS (SEPSIS)
1000.0000 mL | Freq: Once | INTRAVENOUS | Status: AC
  Administered 2015-05-18: 1000 mL via INTRAVENOUS

## 2015-05-18 MED ORDER — FAMOTIDINE IN NACL 20-0.9 MG/50ML-% IV SOLN
20.0000 mg | Freq: Once | INTRAVENOUS | Status: AC
  Administered 2015-05-18: 20 mg via INTRAVENOUS
  Filled 2015-05-18: qty 50

## 2015-05-18 MED ORDER — DIPHENHYDRAMINE HCL 50 MG/ML IJ SOLN
25.0000 mg | Freq: Once | INTRAMUSCULAR | Status: AC
  Administered 2015-05-18: 25 mg via INTRAVENOUS
  Filled 2015-05-18: qty 1

## 2015-05-18 MED ORDER — MORPHINE SULFATE (PF) 2 MG/ML IV SOLN
2.0000 mg | Freq: Once | INTRAVENOUS | Status: AC
  Administered 2015-05-18: 2 mg via INTRAVENOUS
  Filled 2015-05-18: qty 1

## 2015-05-18 MED ORDER — PREDNISONE 20 MG PO TABS
60.0000 mg | ORAL_TABLET | Freq: Once | ORAL | Status: AC
Start: 2015-05-18 — End: 2015-05-18
  Administered 2015-05-18: 60 mg via ORAL
  Filled 2015-05-18: qty 3

## 2015-05-18 NOTE — ED Provider Notes (Signed)
Complains of a burning sensation in her face, both hands and right great toe onset approximately one hour after taking Cipro for urinary tract infection earlier today. Shortness of breath or difficulty swallowing or dyspnea or hoarseness. No other associated symptoms.  Doug Sou, MD 05/18/15 [phone]

## 2015-05-18 NOTE — ED Notes (Signed)
Per EMS pt comes in for allergic reaction after taking Cipro at 1 pm.  Pt started having tingling/burning in bilat hands, feet, tongue.  Pt states that she walked to [facility] when symptoms started and took 3  tablets.  Pt denies any relief.  Pt recently diagnosed with UTI and medication was started today.  Pt in no acute respiratory distress at this time.

## 2015-05-18 NOTE — ED Notes (Signed)
Pt asked to undress and put gown on. Provided warm blanket. Pt states "I'll think about it".

## 2015-05-18 NOTE — ED Provider Notes (Signed)
CSN: [id number]     Arrival date & time 05/18/15  1723 History   First MD Initiated Contact with Patient 05/18/15 2109     Chief Complaint  Patient presents with  . Allergic Reaction    HPI   29 year old female presents today with an allergic reaction. Patient reports that she was diagnosed with urinary tract infection and prescribed Cipro. Patient reports she took her first dose at 1:00 today, shortly followed by swelling of her feet and lips hands. Discussion notes a rash to the palms of her wrist and hands. Patient reports she's had somewhat similar symptoms with amoxicillin but did not have the angioedema. Patient denies difficulty breathing swelling of the tongue, and throat throat. She does " tingling" in the throat. Patient reports that she has taken a total of 75 mg of Benadryl, but symptoms are not improving. Patient denies any new additions to her medical regimen last several days, but does note that she was recently hospitalized with intubation at the beginning of this month due to respiratory failure from a pulmonary embolism. Patient was started on multiple drugs, no recent inhibitors, ibuprofen.  Past Medical History  Diagnosis Date  . Lupus   . Arthritis   . Thyroid disease   . Diabetes mellitus without complication   . PE (pulmonary embolism)   . DVT (deep venous thrombosis)    Past Surgical History  Procedure Laterality Date  . Pacemaker insertion    . Cesarean section     No family history on file. Social History  Substance Use Topics  . Smoking status: Never Smoker   . Smokeless tobacco: None  . Alcohol Use: No   OB History    No data available     Review of Systems  All other systems reviewed and are negative.   Allergies  Ciprofloxacin; Amoxicillin; and Sulfa antibiotics  Home Medications   Prior to Admission medications   Medication Sig Start Date End Date Taking? Authorizing Provider  apixaban (ELIQUIS) 5 MG TABS tablet Take 1 tablet (5 mg total)  by mouth 2 (two) times daily. 05/08/15  Yes Leroy Sea, MD  apixaban (ELIQUIS) 5 MG TABS tablet Take 5 mg by mouth 2 (two) times daily.   Yes Historical Provider, MD  carvedilol (COREG) 12.5 MG tablet Take 1 tablet (12.5 mg total) by mouth 2 (two) times daily with a meal. 05/02/15  Yes Leroy Sea, MD  ciprofloxacin (CIPRO) 500 MG tablet Take 500 mg by mouth 2 (two) times daily.   Yes Historical Provider, MD  diphenhydrAMINE (BENADRYL) 25 MG tablet Take 25 mg by mouth every 6 (six) hours as needed for itching or allergies.   Yes Historical Provider, MD  furosemide (LASIX) 20 MG tablet Take 1 tablet (20 mg total) by mouth daily. 05/02/15  Yes Leroy Sea, MD  isosorbide-hydrALAZINE (BIDIL) 20-37.5 MG per tablet Take 1 tablet by mouth 3 (three) times daily. 05/02/15  Yes Leroy Sea, MD  oxycodone (ROXICODONE) 30 MG immediate release tablet Take 30 mg by mouth every 4 (four) hours as needed for pain.   Yes Historical Provider, MD  predniSONE (DELTASONE) 20 MG tablet Take 20 mg by mouth daily with breakfast.   Yes Historical Provider, MD  QUEtiapine (SEROQUEL) 50 MG tablet Take 50 mg by mouth at bedtime.   Yes Historical Provider, MD  apixaban (ELIQUIS) 5 MG TABS tablet Take 2 tablets (10 mg total) by mouth 2 (two) times daily. 05/02/15 05/07/15  Leroy Sea,  MD  nitrofurantoin, macrocrystal-monohydrate, (MACROBID) 100 MG capsule Take 1 capsule (100 mg total) by mouth 2 (two) times daily. 05/19/15   Eyvonne Mechanic, PA-C  oxyCODONE-acetaminophen (PERCOCET/ROXICET) 5-325 MG per tablet Take 1 tablet by mouth every 4 (four) hours as needed for severe pain. 05/19/15   Eyvonne Mechanic, PA-C   BP 182/92 mmHg  Pulse 86  Temp(Src) 98.4 F (36.9 C) (Oral)  Resp 20  SpO2 98%  LMP 04/27/2015 (Approximate)   Physical Exam  Constitutional: She is oriented to person, place, and time. She appears well-developed and well-nourished.  HENT:  Head: Normocephalic and atraumatic.  Mouth/Throat: Uvula  is midline, oropharynx is clear and moist and mucous membranes are normal. No oropharyngeal exudate, posterior oropharyngeal edema, posterior oropharyngeal erythema or tonsillar abscesses.  No swelling of the tongue, edema to the left, face.  Eyes: Conjunctivae are normal. Pupils are equal, round, and reactive to light. Right eye exhibits no discharge. Left eye exhibits no discharge. No scleral icterus.  Neck: Normal range of motion. No JVD present. No tracheal deviation present.  Cardiovascular: Normal rate, regular rhythm, normal heart sounds and intact distal pulses.  Exam reveals no gallop and no friction rub.   Pulmonary/Chest: Effort normal and breath sounds normal. No stridor. No respiratory distress. She has no wheezes. She has no rales. She exhibits no tenderness.  Musculoskeletal:  Swelling to the hands and feet, rash to the palms of the hands   Neurological: She is alert and oriented to person, place, and time. Coordination normal.  Psychiatric: She has a normal mood and affect. Her behavior is normal. Judgment and thought content normal.  Nursing note and vitals reviewed.   ED Course  Procedures (including critical care time) Labs Review Labs Reviewed  URINALYSIS, ROUTINE W REFLEX MICROSCOPIC (NOT AT [facility]) - Abnormal; Notable for the following:    Protein, ur 100 (*)    Leukocytes, UA SMALL (*)    All other components within normal limits  URINE MICROSCOPIC-ADD ON  POC URINE PREG, ED    Imaging Review No results found. I have personally reviewed and evaluated these images and lab results as part of my medical decision-making.   EKG Interpretation None      MDM   Final diagnoses:  Angioedema, initial encounter  UTI (lower urinary tract infection)    Labs: Point of care  urine pregnancy, urinalysis, urine microscopic  Imaging:  Consults:  Therapeutics: Morphine, Benadryl, normal saline, prednisone, Pepcid  Discharge Meds: Macrobid  Assessment/Plan: Patient  presents with angioedema. She has no airway involvement, difficulty breathing, respiratory distress, sensation of throat closing. She was given Zantac, prednisone, and Benadryl here in the ED, patient reports that symptoms were slowly improving, swelling in the eyes has improved. This is likely due to her use of Cipro, she will discontinue taking the Cipro, and will be started on Macrobid. Patient is advised to contact her primary care provider tomorrow and follow-up immediately for further evaluation and management. She is given strict return precautions the event new or worsening signs or symptoms present.    Pt's care was shared with Dr. Ethelda Chick who personally evaluated and agreed to my assessment and plan.     Eyvonne Mechanic, PA-C 05/19/15 [address], PA-C 05/19/15 1413  Doug Sou, MD 05/19/15 [phone]

## 2015-05-18 NOTE — ED Notes (Signed)
Elevated bp, kellee advised

## 2015-05-18 NOTE — ED Notes (Signed)
Patient came to desk and asked when MD was going to see her, I explained to patient I would follow up with MD.  Patient does not appear to be in any distress.

## 2015-05-18 NOTE — ED Notes (Signed)
Apologized to pt about wait time. Explained that everyone is working hard to make sure she is seen in a timely fashion.

## 2015-05-19 MED ORDER — OXYCODONE-ACETAMINOPHEN 5-325 MG PO TABS: 1.0000 | ORAL_TABLET | ORAL | Status: DC | PRN

## 2015-05-19 MED ORDER — NITROFURANTOIN MONOHYD MACRO 100 MG PO CAPS: 100.0000 mg | ORAL_CAPSULE | Freq: Two times a day (BID) | ORAL | Status: DC

## 2015-05-19 NOTE — Discharge Instructions (Signed)
Angioedema Angioedema is sudden puffiness (swelling), often of the skin. It can happen:  On your face or privates (genitals).  In your belly (abdomen) or other body parts. It usually happens quickly and gets better in 1 or 2 days. It often starts at night and is found when you wake up. You may get red, itchy patches of skin (hives). Attacks can be dangerous if your breathing passages get puffy. The condition may happen only once, or it can come back at random times. It may happen for several years before it goes away for good. HOME CARE  Only take medicines as told by your doctor.  Always carry your emergency allergy medicines with you.  Wear a medical bracelet as told by your doctor.  Avoid things that you know will cause attacks (triggers). GET HELP IF:  You have another attack.  Your attacks happen more often or get worse.  The condition was passed to you by your parents and you want to have children. GET HELP RIGHT AWAY IF:   Your mouth, tongue, or lips are very puffy.  You have trouble breathing.  You have trouble swallowing.  You pass out (faint). MAKE SURE YOU:   Understand these instructions.  Will watch your condition.  Will get help right away if you are not doing well or get worse. Document Released: 08/29/2009 Document Revised: 07/01/2013 Document Reviewed: 05/04/2013 [facility] Patient Information 2015 [place], [place]. This information is not intended to replace advice given to you by your health care provider. Make sure you discuss any questions you have with your health care provider.   Please discontinue using Cipro. If worsening signs or symptoms present please return emergently to the ED.

## 2015-05-19 NOTE — ED Notes (Signed)
Paige Aguilar aware of elevated bp

## 2015-05-23 ENCOUNTER — Emergency Department (HOSPITAL_COMMUNITY): Payer: Medicaid Other

## 2015-05-23 ENCOUNTER — Emergency Department (HOSPITAL_COMMUNITY)
Admission: EM | Admit: 2015-05-23 | Discharge: 2015-05-23 | Disposition: A | Discharge status: Home / Self Care | Payer: Medicaid Other | Attending: Emergency Medicine | Admitting: Emergency Medicine

## 2015-05-23 DIAGNOSIS — L930 Discoid lupus erythematosus: Secondary | ICD-10-CM | POA: Insufficient documentation

## 2015-05-23 DIAGNOSIS — Z950 Presence of cardiac pacemaker: Secondary | ICD-10-CM | POA: Diagnosis not present

## 2015-05-23 DIAGNOSIS — Z86711 Personal history of pulmonary embolism: Secondary | ICD-10-CM | POA: Diagnosis not present

## 2015-05-23 DIAGNOSIS — Z7952 Long term (current) use of systemic steroids: Secondary | ICD-10-CM | POA: Insufficient documentation

## 2015-05-23 DIAGNOSIS — N189 Chronic kidney disease, unspecified: Secondary | ICD-10-CM | POA: Insufficient documentation

## 2015-05-23 DIAGNOSIS — Z3202 Encounter for pregnancy test, result negative: Secondary | ICD-10-CM | POA: Insufficient documentation

## 2015-05-23 DIAGNOSIS — Z792 Long term (current) use of antibiotics: Secondary | ICD-10-CM | POA: Insufficient documentation

## 2015-05-23 DIAGNOSIS — Z86718 Personal history of other venous thrombosis and embolism: Secondary | ICD-10-CM | POA: Diagnosis not present

## 2015-05-23 DIAGNOSIS — Z8639 Personal history of other endocrine, nutritional and metabolic disease: Secondary | ICD-10-CM | POA: Diagnosis not present

## 2015-05-23 DIAGNOSIS — M1990 Unspecified osteoarthritis, unspecified site: Secondary | ICD-10-CM | POA: Diagnosis not present

## 2015-05-23 DIAGNOSIS — Z79899 Other long term (current) drug therapy: Secondary | ICD-10-CM | POA: Insufficient documentation

## 2015-05-23 DIAGNOSIS — E119 Type 2 diabetes mellitus without complications: Secondary | ICD-10-CM | POA: Diagnosis not present

## 2015-05-23 DIAGNOSIS — M329 Systemic lupus erythematosus, unspecified: Secondary | ICD-10-CM

## 2015-05-23 DIAGNOSIS — R109 Unspecified abdominal pain: Secondary | ICD-10-CM | POA: Diagnosis not present

## 2015-05-23 DIAGNOSIS — R0602 Shortness of breath: Secondary | ICD-10-CM | POA: Insufficient documentation

## 2015-05-23 DIAGNOSIS — Z7901 Long term (current) use of anticoagulants: Secondary | ICD-10-CM | POA: Diagnosis not present

## 2015-05-23 DIAGNOSIS — R52 Pain, unspecified: Secondary | ICD-10-CM

## 2015-05-23 LAB — URINE MICROSCOPIC-ADD ON

## 2015-05-23 LAB — COMPREHENSIVE METABOLIC PANEL
ALBUMIN: 2.9 g/dL — AB (ref 3.5–5.0)
ALK PHOS: 60 U/L (ref 38–126)
ALT: 16 U/L (ref 14–54)
ANION GAP: 11 (ref 5–15)
AST: 25 U/L (ref 15–41)
BUN: 74 mg/dL — ABNORMAL HIGH (ref 6–20)
CALCIUM: 8.8 mg/dL — AB (ref 8.9–10.3)
CHLORIDE: 109 mmol/L (ref 101–111)
CO2: 19 mmol/L — AB (ref 22–32)
Creatinine, Ser: 4.21 mg/dL — ABNORMAL HIGH (ref 0.44–1.00)
GFR calc non Af Amer: 13 mL/min — ABNORMAL LOW (ref >60)
GFR, EST AFRICAN AMERICAN: 15 mL/min — AB (ref >60)
GLUCOSE: 129 mg/dL — AB (ref 65–99)
POTASSIUM: 4.8 mmol/L (ref 3.5–5.1)
SODIUM: 139 mmol/L (ref 135–145)
Total Bilirubin: 0.5 mg/dL (ref 0.3–1.2)
Total Protein: 6.1 g/dL — ABNORMAL LOW (ref 6.5–8.1)

## 2015-05-23 LAB — URINALYSIS, ROUTINE W REFLEX MICROSCOPIC
Bilirubin Urine: NEGATIVE
Glucose, UA: NEGATIVE mg/dL
HGB URINE DIPSTICK: NEGATIVE
Ketones, ur: NEGATIVE mg/dL
NITRITE: NEGATIVE
PROTEIN: 100 mg/dL — AB
SPECIFIC GRAVITY, URINE: 1.015 (ref 1.005–1.030)
UROBILINOGEN UA: 0.2 mg/dL (ref 0.0–1.0)
pH: 5.5 (ref 5.0–8.0)

## 2015-05-23 LAB — PREGNANCY, URINE: Preg Test, Ur: NEGATIVE

## 2015-05-23 LAB — CBC WITH DIFFERENTIAL/PLATELET
BASOS PCT: 0 % (ref 0–1)
Basophils Absolute: 0 10*3/uL (ref 0.0–0.1)
EOS ABS: 0 10*3/uL (ref 0.0–0.7)
EOS PCT: 0 % (ref 0–5)
HCT: 23.3 % — ABNORMAL LOW (ref 36.0–46.0)
HEMOGLOBIN: 7.2 g/dL — AB (ref 12.0–15.0)
LYMPHS ABS: 1.6 10*3/uL (ref 0.7–4.0)
Lymphocytes Relative: 16 % (ref 12–46)
MCH: 28.3 pg (ref 26.0–34.0)
MCHC: 30.9 g/dL (ref 30.0–36.0)
MCV: 91.7 fL (ref 78.0–100.0)
MONOS PCT: 3 % (ref 3–12)
Monocytes Absolute: 0.4 10*3/uL (ref 0.1–1.0)
NEUTROS PCT: 81 % — AB (ref 43–77)
Neutro Abs: 8.3 10*3/uL — ABNORMAL HIGH (ref 1.7–7.7)
PLATELETS: 584 10*3/uL — AB (ref 150–400)
RBC: 2.54 MIL/uL — ABNORMAL LOW (ref 3.87–5.11)
RDW: 15.2 % (ref 11.5–15.5)
WBC: 10.3 10*3/uL (ref 4.0–10.5)

## 2015-05-23 LAB — BRAIN NATRIURETIC PEPTIDE: B NATRIURETIC PEPTIDE 5: 749 pg/mL — AB (ref 0.0–100.0)

## 2015-05-23 LAB — TROPONIN I

## 2015-05-23 LAB — LIPASE, BLOOD: Lipase: 52 U/L — ABNORMAL HIGH (ref 22–51)

## 2015-05-23 MED ORDER — TECHNETIUM TO 99M ALBUMIN AGGREGATED
6.0000 | Freq: Once | INTRAVENOUS | Status: AC | PRN
  Administered 2015-05-23: 6 via INTRAVENOUS

## 2015-05-23 MED ORDER — LORAZEPAM 2 MG/ML IJ SOLN
1.0000 mg | Freq: Once | INTRAMUSCULAR | Status: DC
Start: 2015-05-23 — End: 2015-05-23

## 2015-05-23 MED ORDER — TECHNETIUM TC 99M DIETHYLENETRIAME-PENTAACETIC ACID: 40.0000 | Freq: Once | INTRAVENOUS | Status: DC | PRN

## 2015-05-23 MED ORDER — HYDROMORPHONE HCL 1 MG/ML IJ SOLN
1.0000 mg | Freq: Once | INTRAMUSCULAR | Status: AC
  Administered 2015-05-23: 1 mg via INTRAVENOUS
  Filled 2015-05-23: qty 1

## 2015-05-23 NOTE — ED Notes (Addendum)
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 States hurts to take a deep breath. Also c/o bilateral knee pain. Pt ambulatory. Lungs CTa. VSS.

## 2015-05-23 NOTE — Discharge Instructions (Signed)

## 2015-05-23 NOTE — ED Provider Notes (Signed)
Patient is a 29 year old female with a history of lupus that presents with shortness of breath. Patient was improved with cerumen [place] by PCP. Worsening shortness of breath and worsening renal function therefore a CTA PE study is not able to be obtained. Plan is to get a VQ scan and if it is negative patient can follow up with PCP as this is likely musculoskeletal however if it is positive we will admit for failure of anticoagulation.  Patient's VQ scan is unremarkable. Patient remains hemodynamically stable. Patient discharged in good condition.  Beverely Risen, MD 05/23/15 2008

## 2015-05-23 NOTE — ED Provider Notes (Signed)
CSN: [id number]     Arrival date & time 05/23/15  1101 History   First MD Initiated Contact with Patient 05/23/15 1403     Chief Complaint  Patient presents with  . Lupus   Patient is a 29 y.o. female presenting with general illness. The history is provided by the patient. No language interpreter was used.  Illness Location:  R flank Quality:  Pain Severity:  Moderate Onset quality:  Gradual Timing:  Constant Progression:  Worsening Chronicity:  New Context:  PMHx of lupus with resulting CKD on chronic prednisone and recently diagnosed DVT with recent admission x3 weeks ago for acute respiratory failure and new onset sHF (EF 25-30%) presumed to be from PE - empiric anticoagulation at that time given DVT diagnosis. Patient admitted for 5 days and discharged home on Eliquis. Right sided flank pain x1 week which is worse with deep inspiration. Mild non-productive cough & nausea & constipation (last MB yesterday evening). No fever, LE swelling, hematuria, dysuria Associated symptoms: abdominal pain (R flank), cough, nausea and shortness of breath   Associated symptoms: no chest pain, no diarrhea, no fever, no vomiting and no wheezing     Past Medical History  Diagnosis Date  . Lupus   . Arthritis   . Thyroid disease   . Diabetes mellitus without complication   . PE (pulmonary embolism)   . DVT (deep venous thrombosis)    Past Surgical History  Procedure Laterality Date  . Pacemaker insertion    . Cesarean section     No family history on file. Social History  Substance Use Topics  . Smoking status: Never Smoker   . Smokeless tobacco: Not on file  . Alcohol Use: No   OB History    No data available      Review of Systems  Constitutional: Negative for fever.  Respiratory: Positive for cough and shortness of breath. Negative for wheezing.   Cardiovascular: Positive for leg swelling (improving). Negative for chest pain.  Gastrointestinal: Positive for nausea and abdominal  pain (R flank). Negative for vomiting and diarrhea.  Genitourinary: Negative for dysuria, urgency, frequency and hematuria.  All other systems reviewed and are negative.   Allergies  Ciprofloxacin; Amoxicillin; and Sulfa antibiotics  Home Medications   Prior to Admission medications   Medication Sig Start Date End Date Taking? Authorizing Provider  apixaban (ELIQUIS) 5 MG TABS tablet Take 1 tablet (5 mg total) by mouth 2 (two) times daily. 05/08/15  Yes Leroy Sea, MD  apixaban (ELIQUIS) 5 MG TABS tablet Take 5 mg by mouth 2 (two) times daily.   Yes Historical Provider, MD  carvedilol (COREG) 12.5 MG tablet Take 1 tablet (12.5 mg total) by mouth 2 (two) times daily with a meal. 05/02/15  Yes Leroy Sea, MD  ciprofloxacin (CIPRO) 500 MG tablet Take 500 mg by mouth 2 (two) times daily.   Yes Historical Provider, MD  diphenhydrAMINE (BENADRYL) 25 MG tablet Take 25 mg by mouth every 6 (six) hours as needed for itching or allergies.   Yes Historical Provider, MD  furosemide (LASIX) 20 MG tablet Take 1 tablet (20 mg total) by mouth daily. 05/02/15  Yes Leroy Sea, MD  isosorbide-hydrALAZINE (BIDIL) 20-37.5 MG per tablet Take 1 tablet by mouth 3 (three) times daily. 05/02/15  Yes Leroy Sea, MD  nitrofurantoin, macrocrystal-monohydrate, (MACROBID) 100 MG capsule Take 1 capsule (100 mg total) by mouth 2 (two) times daily. 05/19/15  Yes Eyvonne Mechanic, PA-C  oxycodone (ROXICODONE)  30 MG immediate release tablet Take 30 mg by mouth every 4 (four) hours as needed for pain.   Yes Historical Provider, MD  oxyCODONE-acetaminophen (PERCOCET/ROXICET) 5-325 MG per tablet Take 1 tablet by mouth every 4 (four) hours as needed for severe pain. 05/19/15  Yes Jeffrey Hedges, PA-C  predniSONE (DELTASONE) 20 MG tablet Take 20 mg by mouth daily with breakfast.   Yes Historical Provider, MD  QUEtiapine (SEROQUEL) 50 MG tablet Take 50 mg by mouth at bedtime.   Yes Historical Provider, MD   BP 141/96  mmHg  Pulse 91  Temp(Src) 98.3 F (36.8 C) (Oral)  Resp 18  SpO2 100%  LMP 04/27/2015 (Approximate)   Physical Exam  Constitutional: She is oriented to person, place, and time. She appears well-developed and well-nourished. No distress.  HENT:  Head: Normocephalic and atraumatic.  Eyes: Conjunctivae are normal. Pupils are equal, round, and reactive to light.  Neck: Normal range of motion. Neck supple.  Cardiovascular: Regular rhythm and normal heart sounds.   HR 90's to low 100's  Pulmonary/Chest: Effort normal and breath sounds normal.  Abdominal: Soft. Bowel sounds are normal. She exhibits no distension. There is tenderness (R flank w/ superficial palpation).  Musculoskeletal: Normal range of motion.  Neurological: She is alert and oriented to person, place, and time.  Skin: Skin is warm and dry. She is not diaphoretic.    ED Course  Procedures   Procedure note: Ultrasound Guided Peripheral IV Ultrasound guided 20 g peripheral 1.88 inch angiocath IV placement performed by me. Indications: Nursing unable to place IV. Details: The right antecubital fossa and upper arm was evaluated with a multifrequency linear probe. Several patent brachial veins are noted. 1 attempts were made to cannulate vein under realtime US guidance with successful cannulation of the vein and catheter placement. There is return of non-pulsatile dark red blood. The patient tolerated the procedure well without complications. An ultrasound image was archived.   Labs Review Labs Reviewed  CBC WITH DIFFERENTIAL/PLATELET - Abnormal; Notable for the following:    RBC 2.54 (*)    Hemoglobin 7.2 (*)    HCT 23.3 (*)    Platelets 584 (*)    Neutrophils Relative % 81 (*)    Neutro Abs 8.3 (*)    All other components within normal limits  COMPREHENSIVE METABOLIC PANEL - Abnormal; Notable for the following:    CO2 19 (*)    Glucose, Bld 129 (*)    BUN 74 (*)    Creatinine, Ser 4.21 (*)    Calcium 8.8 (*)     Total Protein 6.1 (*)    Albumin 2.9 (*)    GFR calc non Af Amer 13 (*)    GFR calc Af Amer 15 (*)    All other components within normal limits  LIPASE, BLOOD - Abnormal; Notable for the following:    Lipase 52 (*)    All other components within normal limits  URINALYSIS, ROUTINE W REFLEX MICROSCOPIC (NOT AT [facility]) - Abnormal; Notable for the following:    APPearance HAZY (*)    Protein, ur 100 (*)    Leukocytes, UA SMALL (*)    All other components within normal limits  BRAIN NATRIURETIC PEPTIDE - Abnormal; Notable for the following:    B Natriuretic Peptide 749.0 (*)    All other components within normal limits  URINE MICROSCOPIC-ADD ON - Abnormal; Notable for the following:    Squamous Epithelial / LPF MANY (*)    Bacteria, UA FEW (*)  Casts HYALINE CASTS (*)    All other components within normal limits  TROPONIN I  PREGNANCY, URINE    Imaging Review Dg Chest Port 1 View  05/23/2015   CLINICAL DATA:  Right flank pain and shortness of breath  EXAM: PORTABLE CHEST - 1 VIEW  COMPARISON:  04/28/2015  FINDINGS: Dual-chamber ICD/pacer leads from the left are in unchanged position.  Chronic cardiomegaly with negative aortic and hilar contours.  Limited low volume evaluation of the lungs. There is no edema, consolidation, effusion, or pneumothorax. No osseous findings to explain pain.  IMPRESSION: 1. No active disease. 2. Chronic cardiomegaly.   Electronically Signed   By: Marnee Spring M.D.   On: 05/23/2015 15:10   I have personally reviewed and evaluated these images and lab results as part of my medical decision-making.   EKG Interpretation None      MDM  Ms. 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 Patient admitted  for 5 days and discharged home on Eliquis. Right sided flank pain x1 week which is worse with deep inspiration. Mild non-productive cough & nausea & constipation (last MB yesterday evening). No fever, LE swelling, hematuria, dysuria, urinary frequency/urgency, emesis, diarrhea, or vaginal bleeding/discharge.  Exam above notable for young female lying in stretcher in no acute distress. Afebrile. HR 90's to low 100s. Not tachypneic. Normotensive. Maintaining saturations on room air without suplemental O2. Lungs CTAB. Abd with tenderness upon superficial palpation of right flank.  CXR showing no edema, consolidation, effusion, or pneumothorax. UA showing small leukocytes and few bacteria but negative nitrite and many epithelial cells - also no evidence of dehydration or blood. EKG showing no ST elevation or depression. First troponin within normal limits. BNP 749 decreased from 3000's during last admission. Hemoglobin 7.2 which appears to be patient's baseline with her chronic anemia. 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 Given recent DVT now with right flank pain worse with deep inspiration, concern for PE. Patient unable to undergo CT scan with contrast therefore V/Q scan was ordered - results pending at this time.  Patient care transferred to Dr. Beverely Risen at 4pm - please refer to his note for final V/Q scan results. If results negative then patient can be discharged home with close follow-up with her PCP. If positive then patient will be admitted and treated accordingly. Patient understands and agrees with the plan and has no further questions or concerns at this time.  Patient care discussed with then followed by my attending, Dr. Gwendolyn Grant.  Final diagnoses:  SOB (shortness of breath)  Flank pain  Lupus      Angelina Ok, MD 05/23/15 1723  Elwin Mocha, MD 05/24/15 [phone]

## 2015-05-23 NOTE — ED Notes (Signed)
Pt ambulated to the bathroom with ease 

## 2015-06-12 ENCOUNTER — Emergency Department (HOSPITAL_COMMUNITY): Payer: Medicaid Other

## 2015-06-12 ENCOUNTER — Encounter (HOSPITAL_COMMUNITY): Payer: Self-pay | Admitting: Emergency Medicine

## 2015-06-12 ENCOUNTER — Observation Stay (HOSPITAL_COMMUNITY)
Admission: EM | Admit: 2015-06-12 | Discharge: 2015-06-14 | Disposition: A | Discharge status: Home / Self Care | Payer: Medicaid Other | Attending: Family Medicine | Admitting: Family Medicine

## 2015-06-12 DIAGNOSIS — Z7952 Long term (current) use of systemic steroids: Secondary | ICD-10-CM | POA: Diagnosis not present

## 2015-06-12 DIAGNOSIS — N184 Chronic kidney disease, stage 4 (severe): Secondary | ICD-10-CM | POA: Diagnosis not present

## 2015-06-12 DIAGNOSIS — J029 Acute pharyngitis, unspecified: Secondary | ICD-10-CM | POA: Diagnosis present

## 2015-06-12 DIAGNOSIS — E039 Hypothyroidism, unspecified: Secondary | ICD-10-CM | POA: Insufficient documentation

## 2015-06-12 DIAGNOSIS — I420 Dilated cardiomyopathy: Secondary | ICD-10-CM | POA: Diagnosis present

## 2015-06-12 DIAGNOSIS — M549 Dorsalgia, unspecified: Secondary | ICD-10-CM | POA: Diagnosis present

## 2015-06-12 DIAGNOSIS — M545 Low back pain: Secondary | ICD-10-CM

## 2015-06-12 DIAGNOSIS — M329 Systemic lupus erythematosus, unspecified: Secondary | ICD-10-CM | POA: Diagnosis not present

## 2015-06-12 DIAGNOSIS — N3090 Cystitis, unspecified without hematuria: Secondary | ICD-10-CM | POA: Diagnosis present

## 2015-06-12 DIAGNOSIS — N390 Urinary tract infection, site not specified: Secondary | ICD-10-CM

## 2015-06-12 DIAGNOSIS — R079 Chest pain, unspecified: Secondary | ICD-10-CM | POA: Diagnosis not present

## 2015-06-12 DIAGNOSIS — D649 Anemia, unspecified: Secondary | ICD-10-CM | POA: Diagnosis present

## 2015-06-12 DIAGNOSIS — D638 Anemia in other chronic diseases classified elsewhere: Secondary | ICD-10-CM

## 2015-06-12 DIAGNOSIS — I5043 Acute on chronic combined systolic (congestive) and diastolic (congestive) heart failure: Secondary | ICD-10-CM | POA: Diagnosis present

## 2015-06-12 DIAGNOSIS — Z86711 Personal history of pulmonary embolism: Secondary | ICD-10-CM | POA: Diagnosis not present

## 2015-06-12 DIAGNOSIS — Z7901 Long term (current) use of anticoagulants: Secondary | ICD-10-CM | POA: Insufficient documentation

## 2015-06-12 DIAGNOSIS — I5022 Chronic systolic (congestive) heart failure: Secondary | ICD-10-CM | POA: Diagnosis present

## 2015-06-12 DIAGNOSIS — R000 Tachycardia, unspecified: Secondary | ICD-10-CM | POA: Diagnosis present

## 2015-06-12 DIAGNOSIS — N2889 Other specified disorders of kidney and ureter: Secondary | ICD-10-CM | POA: Diagnosis present

## 2015-06-12 DIAGNOSIS — Z86718 Personal history of other venous thrombosis and embolism: Secondary | ICD-10-CM | POA: Insufficient documentation

## 2015-06-12 DIAGNOSIS — I5023 Acute on chronic systolic (congestive) heart failure: Secondary | ICD-10-CM | POA: Insufficient documentation

## 2015-06-12 DIAGNOSIS — Z95810 Presence of automatic (implantable) cardiac defibrillator: Secondary | ICD-10-CM | POA: Insufficient documentation

## 2015-06-12 DIAGNOSIS — I82409 Acute embolism and thrombosis of unspecified deep veins of unspecified lower extremity: Secondary | ICD-10-CM | POA: Diagnosis present

## 2015-06-12 LAB — CBC WITH DIFFERENTIAL/PLATELET
BASOS ABS: 0 10*3/uL (ref 0.0–0.1)
Basophils Relative: 0 %
EOS ABS: 0 10*3/uL (ref 0.0–0.7)
EOS PCT: 0 %
HCT: 25.4 % — ABNORMAL LOW (ref 36.0–46.0)
Hemoglobin: 7.8 g/dL — ABNORMAL LOW (ref 12.0–15.0)
LYMPHS PCT: 13 %
Lymphs Abs: 1.3 10*3/uL (ref 0.7–4.0)
MCH: 28 pg (ref 26.0–34.0)
MCHC: 30.7 g/dL (ref 30.0–36.0)
MCV: 91 fL (ref 78.0–100.0)
Monocytes Absolute: 0.5 10*3/uL (ref 0.1–1.0)
Monocytes Relative: 5 %
Neutro Abs: 8.5 10*3/uL — ABNORMAL HIGH (ref 1.7–7.7)
Neutrophils Relative %: 82 %
PLATELETS: 523 10*3/uL — AB (ref 150–400)
RBC: 2.79 MIL/uL — AB (ref 3.87–5.11)
RDW: 16.8 % — ABNORMAL HIGH (ref 11.5–15.5)
WBC: 10.3 10*3/uL (ref 4.0–10.5)

## 2015-06-12 LAB — COMPREHENSIVE METABOLIC PANEL
ALBUMIN: 2.6 g/dL — AB (ref 3.5–5.0)
ALT: 11 U/L — AB (ref 14–54)
AST: 17 U/L (ref 15–41)
Alkaline Phosphatase: 65 U/L (ref 38–126)
Anion gap: 12 (ref 5–15)
BUN: 69 mg/dL — AB (ref 6–20)
CHLORIDE: 110 mmol/L (ref 101–111)
CO2: 17 mmol/L — AB (ref 22–32)
CREATININE: 4.79 mg/dL — AB (ref 0.44–1.00)
Calcium: 7.9 mg/dL — ABNORMAL LOW (ref 8.9–10.3)
GFR calc Af Amer: 13 mL/min — ABNORMAL LOW (ref >60)
GFR calc non Af Amer: 11 mL/min — ABNORMAL LOW (ref >60)
Glucose, Bld: 122 mg/dL — ABNORMAL HIGH (ref 65–99)
Potassium: 4.6 mmol/L (ref 3.5–5.1)
SODIUM: 139 mmol/L (ref 135–145)
Total Bilirubin: 0.4 mg/dL (ref 0.3–1.2)
Total Protein: 6 g/dL — ABNORMAL LOW (ref 6.5–8.1)

## 2015-06-12 LAB — URINALYSIS, ROUTINE W REFLEX MICROSCOPIC
Bilirubin Urine: NEGATIVE
Glucose, UA: NEGATIVE mg/dL
KETONES UR: NEGATIVE mg/dL
NITRITE: NEGATIVE
PH: 6 (ref 5.0–8.0)
Protein, ur: 100 mg/dL — AB
SPECIFIC GRAVITY, URINE: 1.011 (ref 1.005–1.030)
Urobilinogen, UA: 0.2 mg/dL (ref 0.0–1.0)

## 2015-06-12 LAB — RAPID URINE DRUG SCREEN, HOSP PERFORMED
AMPHETAMINES: NOT DETECTED
BARBITURATES: NOT DETECTED
Benzodiazepines: NOT DETECTED
Cocaine: NOT DETECTED
Opiates: NOT DETECTED
Tetrahydrocannabinol: NOT DETECTED

## 2015-06-12 LAB — URINE MICROSCOPIC-ADD ON

## 2015-06-12 LAB — I-STAT TROPONIN, ED: Troponin i, poc: 0.03 ng/mL (ref 0.00–0.08)

## 2015-06-12 LAB — PROTIME-INR
INR: 1.28 (ref 0.00–1.49)
Prothrombin Time: 16.1 seconds — ABNORMAL HIGH (ref 11.6–15.2)

## 2015-06-12 LAB — STREP PNEUMONIAE URINARY ANTIGEN: Strep Pneumo Urinary Antigen: NEGATIVE

## 2015-06-12 LAB — I-STAT CG4 LACTIC ACID, ED: LACTIC ACID, VENOUS: 1.61 mmol/L (ref 0.5–2.0)

## 2015-06-12 LAB — POC URINE PREG, ED: PREG TEST UR: NEGATIVE

## 2015-06-12 LAB — LACTIC ACID, PLASMA: LACTIC ACID, VENOUS: 1.3 mmol/L (ref 0.5–2.0)

## 2015-06-12 LAB — TROPONIN I: TROPONIN I: 0.03 ng/mL (ref <0.031)

## 2015-06-12 LAB — BRAIN NATRIURETIC PEPTIDE: B Natriuretic Peptide: 915.5 pg/mL — ABNORMAL HIGH (ref 0.0–100.0)

## 2015-06-12 MED ORDER — SODIUM CHLORIDE 0.9 % IV BOLUS (SEPSIS)
1000.0000 mL | Freq: Once | INTRAVENOUS | Status: AC
  Administered 2015-06-12: 1000 mL via INTRAVENOUS

## 2015-06-12 MED ORDER — OXYCODONE HCL 5 MG PO TABS
30.0000 mg | ORAL_TABLET | ORAL | Status: DC | PRN
  Administered 2015-06-12 – 2015-06-14 (×6): 30 mg via ORAL
  Filled 2015-06-12 (×6): qty 6

## 2015-06-12 MED ORDER — ONDANSETRON HCL 4 MG/2ML IJ SOLN: 4.0000 mg | Freq: Four times a day (QID) | INTRAMUSCULAR | Status: DC | PRN

## 2015-06-12 MED ORDER — ISOSORB DINITRATE-HYDRALAZINE 20-37.5 MG PO TABS
1.0000 | ORAL_TABLET | Freq: Three times a day (TID) | ORAL | Status: DC
  Administered 2015-06-12 – 2015-06-13 (×2): 1 via ORAL
  Filled 2015-06-12 (×2): qty 1

## 2015-06-12 MED ORDER — ACETAMINOPHEN 325 MG PO TABS: 650.0000 mg | ORAL_TABLET | ORAL | Status: DC | PRN

## 2015-06-12 MED ORDER — MORPHINE SULFATE (PF) 2 MG/ML IV SOLN: 2.0000 mg | INTRAVENOUS | Status: DC | PRN

## 2015-06-12 MED ORDER — ALPRAZOLAM 0.25 MG PO TABS: 0.2500 mg | ORAL_TABLET | Freq: Two times a day (BID) | ORAL | Status: DC | PRN

## 2015-06-12 MED ORDER — PREDNISONE 20 MG PO TABS
20.0000 mg | ORAL_TABLET | Freq: Every day | ORAL | Status: DC
  Administered 2015-06-13 – 2015-06-14 (×2): 20 mg via ORAL
  Filled 2015-06-12 (×2): qty 1

## 2015-06-12 MED ORDER — DEXTROSE 5 % IV SOLN
2.0000 g | INTRAVENOUS | Status: DC
  Administered 2015-06-12 – 2015-06-13 (×2): 2 g via INTRAVENOUS
  Filled 2015-06-12 (×3): qty 2

## 2015-06-12 MED ORDER — CARVEDILOL 12.5 MG PO TABS
12.5000 mg | ORAL_TABLET | Freq: Two times a day (BID) | ORAL | Status: DC
  Administered 2015-06-12 – 2015-06-13 (×2): 12.5 mg via ORAL
  Filled 2015-06-12 (×2): qty 1

## 2015-06-12 MED ORDER — QUETIAPINE FUMARATE 25 MG PO TABS
50.0000 mg | ORAL_TABLET | Freq: Every day | ORAL | Status: DC
  Administered 2015-06-12 – 2015-06-13 (×2): 50 mg via ORAL
  Filled 2015-06-12 (×4): qty 2

## 2015-06-12 MED ORDER — DIPHENHYDRAMINE HCL 25 MG PO CAPS: 25.0000 mg | ORAL_CAPSULE | Freq: Four times a day (QID) | ORAL | Status: DC | PRN

## 2015-06-12 MED ORDER — APIXABAN 5 MG PO TABS
5.0000 mg | ORAL_TABLET | Freq: Two times a day (BID) | ORAL | Status: DC
  Administered 2015-06-12 – 2015-06-14 (×4): 5 mg via ORAL
  Filled 2015-06-12 (×4): qty 1

## 2015-06-12 MED ORDER — HYDROCODONE-ACETAMINOPHEN 5-325 MG PO TABS
2.0000 | ORAL_TABLET | Freq: Once | ORAL | Status: AC
  Administered 2015-06-12: 2 via ORAL
  Filled 2015-06-12: qty 2

## 2015-06-12 MED ORDER — ASPIRIN EC 81 MG PO TBEC
81.0000 mg | DELAYED_RELEASE_TABLET | Freq: Every day | ORAL | Status: DC
  Administered 2015-06-12 – 2015-06-14 (×3): 81 mg via ORAL
  Filled 2015-06-12 (×3): qty 1

## 2015-06-12 MED ORDER — DEXTROSE 5 % IV SOLN
1.0000 g | Freq: Once | INTRAVENOUS | Status: AC
  Administered 2015-06-12: 1 g via INTRAVENOUS
  Filled 2015-06-12: qty 10

## 2015-06-12 NOTE — ED Notes (Signed)
Pt hooked back up to the monitor with a 5 lead, BP cuff and pulse ox 

## 2015-06-12 NOTE — Progress Notes (Signed)
ANTIBIOTIC CONSULT NOTE - INITIAL  Pharmacy Consult for Ceftazidime and Apixaban Indication: UTI and PE  Allergies  Allergen Reactions  . Amoxicillin Rash  . Ciprofloxacin Itching and Rash  . Sulfa Antibiotics Rash    Vital Signs: Temp: 98.2 F (36.8 C) (09/18 2012) Temp Source: Oral (09/18 2012) BP: 152/108 mmHg (09/18 2012) Pulse Rate: 124 (09/18 1945) Intake/Output from previous day:   Intake/Output from this shift:    Labs:  Recent Labs  06/12/15 1456  WBC 10.3  HGB 7.8*  PLT 523*  CREATININE 4.79*   CrCl cannot be calculated (Unknown ideal weight.). No results for input(s): VANCOTROUGH, VANCOPEAK, VANCORANDOM, GENTTROUGH, GENTPEAK, GENTRANDOM, TOBRATROUGH, TOBRAPEAK, TOBRARND, AMIKACINPEAK, AMIKACINTROU, AMIKACIN in the last 72 hours.   Microbiology: No results found for this or any previous visit (from the past 720 hour(s)).  Medical History: Past Medical History  Diagnosis Date  . Lupus   . Arthritis   . Thyroid disease   . Diabetes mellitus without complication   . PE (pulmonary embolism)   . DVT (deep venous thrombosis)     Medications:  Prescriptions prior to admission  Medication Sig Dispense Refill Last Dose  . apixaban (ELIQUIS) 5 MG TABS tablet Take 5 mg by mouth 2 (two) times daily.   06/12/2015 at Unknown time  . carvedilol (COREG) 12.5 MG tablet Take 1 tablet (12.5 mg total) by mouth 2 (two) times daily with a meal. 60 tablet 0 06/12/2015 at 9am  . diphenhydrAMINE (BENADRYL) 25 MG tablet Take 25 mg by mouth every 6 (six) hours as needed for itching or allergies.   Past Week at Unknown time  . furosemide (LASIX) 20 MG tablet Take 1 tablet (20 mg total) by mouth daily. 30 tablet 0 06/12/2015 at Unknown time  . isosorbide-hydrALAZINE (BIDIL) 20-37.5 MG per tablet Take 1 tablet by mouth 3 (three) times daily. 90 tablet 0 06/12/2015 at Unknown time  . oxycodone (ROXICODONE) 30 MG immediate release tablet Take 30 mg by mouth every 4 (four) hours as  needed for pain.   06/11/2015 at Unknown time  . predniSONE (DELTASONE) 20 MG tablet Take 20 mg by mouth daily with breakfast.   06/12/2015 at Unknown time  . QUEtiapine (SEROQUEL) 50 MG tablet Take 50 mg by mouth at bedtime.   06/11/2015 at Unknown time  . apixaban (ELIQUIS) 5 MG TABS tablet Take 1 tablet (5 mg total) by mouth 2 (two) times daily. 60 tablet 1 05/23/2015 at Unknown time  . ciprofloxacin (CIPRO) 500 MG tablet Take 500 mg by mouth 2 (two) times daily.   Past Week at Unknown time  . nitrofurantoin, macrocrystal-monohydrate, (MACROBID) 100 MG capsule Take 1 capsule (100 mg total) by mouth 2 (two) times daily. (Patient not taking: Reported on 06/12/2015) 10 capsule 0 Completed Course at Unknown time  . oxyCODONE-acetaminophen (PERCOCET/ROXICET) 5-325 MG per tablet Take 1 tablet by mouth every 4 (four) hours as needed for severe pain. 6 tablet 0    Assessment: Asked to dose Apixaban and ceftazidime.  She has been on Apixaban  BID for VTE treatment.  Ceftazidime to start for UTI treatment.  Her Creatinine is elevated at 4.79 with an estimated CrCl of ~13mL/min   Plan:  Apixaban  po BID  Ceftazidime 2g IV q24h Follow up cultures and renal function  Mickeal Skinner 06/12/2015,9:02 PM

## 2015-06-12 NOTE — ED Notes (Signed)
SOB/CP today. History of CHF. Endorses sore throat from recent intubation. A/O x4. Endorses weakness

## 2015-06-12 NOTE — ED Provider Notes (Signed)
CSN: [id number]     Arrival date & time 06/12/15  1442 History   First MD Initiated Contact with Patient 06/12/15 1548     Chief Complaint  Patient presents with  . Shortness of Breath     (Consider location/radiation/quality/duration/timing/severity/associated sxs/prior Treatment) The history is provided by the patient, medical records and a relative. No language interpreter was used.    Paige Aguilar is a 29 y.o. 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 Associated symptoms include productive cough with clear sputum and right otalgia onset 2 days ago.  She also complains of malodorous urine intermittently for several weeks after treatment with multiple antibiotics including Septra and Macrobid. She reports approximately 1 week ago she developed mild lower abdominal pain.  Denies fevers, chills, dysuria. She reports intermittent hematuria but none today.   Patient reports she has a "mass" on her right kidney. Patient reports no significant aggravating or alleviating factors.  She reports she is taking her medications as directed including Eliquis.  Patient reports she has not followed up with cardiology as directed after her last admission to the hospital.    Review shows long, located history. Patient was intubated in early August 2016 for significant congestive heart failure with decreased ejection fraction.  Patient with decreased ejection fraction of the right ventricle as well with presumed PE as patient had bilateral DVTs. She was anticoagulated then and has been anticoagulated since that time. She was again evaluated on 05/22/2015 for shortness of breath. At that time her IV/Q scan was negative for PE. She is unable to have IV contrast because of chronic kidney failure. She is not currently on dialysis.      Past  Medical History  Diagnosis Date  . Lupus   . Arthritis   . Thyroid disease   . Diabetes mellitus without complication   . PE (pulmonary embolism)   . DVT (deep venous thrombosis)    Past Surgical History  Procedure Laterality Date  . Pacemaker insertion    . Cesarean section     History reviewed. No pertinent family history. Social History  Substance Use Topics  . Smoking status: Never Smoker   . Smokeless tobacco: None  . Alcohol Use: No   OB History    No data available     Review of Systems  Constitutional: Negative for fever, diaphoresis, appetite change, fatigue and unexpected weight change.  HENT: Positive for ear pain (right) and sore throat ( since intubation). Negative for mouth sores.   Eyes: Negative for visual disturbance.  Respiratory: Positive for cough (productive of clear sputum) and shortness of breath. Negative for chest tightness and wheezing.   Cardiovascular: Negative for chest pain.  Gastrointestinal: Negative for nausea, vomiting, abdominal pain, diarrhea and constipation.  Endocrine: Negative for polydipsia, polyphagia and polyuria.  Genitourinary: Negative for dysuria, urgency, frequency and hematuria.       Malodorous urine  Musculoskeletal: Negative for back pain and neck stiffness.  Skin: Negative for rash.  Allergic/Immunologic: Negative for immunocompromised state.  Neurological: Negative for syncope, light-headedness and headaches.  Hematological: Does not bruise/bleed easily.  Psychiatric/Behavioral: Negative for sleep disturbance. The patient is not nervous/anxious.       Allergies  Ciprofloxacin; Amoxicillin; and Sulfa antibiotics  Home Medications   Prior to Admission medications   Medication Sig Start Date End Date Taking? Authorizing Provider  apixaban Everlene Balls)  5 MG TABS tablet Take 5 mg by mouth 2 (two) times daily.   Yes Historical Provider, MD  carvedilol (COREG) 12.5 MG tablet Take 1 tablet (12.5 mg total) by mouth 2 (two)  times daily with a meal. 05/02/15  Yes Leroy Sea, MD  diphenhydrAMINE (BENADRYL) 25 MG tablet Take 25 mg by mouth every 6 (six) hours as needed for itching or allergies.   Yes Historical Provider, MD  furosemide (LASIX) 20 MG tablet Take 1 tablet (20 mg total) by mouth daily. 05/02/15  Yes Leroy Sea, MD  isosorbide-hydrALAZINE (BIDIL) 20-37.5 MG per tablet Take 1 tablet by mouth 3 (three) times daily. 05/02/15  Yes Leroy Sea, MD  oxycodone (ROXICODONE) 30 MG immediate release tablet Take 30 mg by mouth every 4 (four) hours as needed for pain.   Yes Historical Provider, MD  oxyCODONE-acetaminophen (PERCOCET/ROXICET) 5-325 MG per tablet Take 1 tablet by mouth every 4 (four) hours as needed for severe pain. 05/19/15  Yes Jeffrey Hedges, PA-C  predniSONE (DELTASONE) 20 MG tablet Take 20 mg by mouth daily with breakfast.   Yes Historical Provider, MD  QUEtiapine (SEROQUEL) 50 MG tablet Take 50 mg by mouth at bedtime.   Yes Historical Provider, MD  apixaban (ELIQUIS) 5 MG TABS tablet Take 1 tablet (5 mg total) by mouth 2 (two) times daily. 05/08/15   Leroy Sea, MD  ciprofloxacin (CIPRO) 500 MG tablet Take 500 mg by mouth 2 (two) times daily.    Historical Provider, MD  nitrofurantoin, macrocrystal-monohydrate, (MACROBID) 100 MG capsule Take 1 capsule (100 mg total) by mouth 2 (two) times daily. Patient not taking: Reported on 06/12/2015 05/19/15   Eyvonne Mechanic, PA-C   BP 155/109 mmHg  Pulse 94  Temp(Src) 98.5 F (36.9 C) (Rectal)  Resp 28  SpO2 100%  LMP 04/27/2015 (Approximate) Physical Exam  Constitutional: She is oriented to person, place, and time. She appears well-developed and well-nourished. No distress.  Awake, alert, nontoxic appearance Moon facies  HENT:  Head: Normocephalic and atraumatic.  Right Ear: External ear and ear canal normal. A middle ear effusion is present.  Left Ear: Tympanic membrane, external ear and ear canal normal.  Nose: Mucosal edema and  rhinorrhea present. No epistaxis. Right sinus exhibits no maxillary sinus tenderness and no frontal sinus tenderness. Left sinus exhibits no maxillary sinus tenderness and no frontal sinus tenderness.  Mouth/Throat: Uvula is midline, oropharynx is clear and moist and mucous membranes are normal. Mucous membranes are not pale and not cyanotic. No oropharyngeal exudate, posterior oropharyngeal edema, posterior oropharyngeal erythema or tonsillar abscesses.  Nonsuppurative right middle ear effusion without erythema  Eyes: Conjunctivae and EOM are normal. Pupils are equal, round, and reactive to light. No scleral icterus.  Neck: Normal range of motion and full passive range of motion without pain. Neck supple. No JVD present.  Cardiovascular: Regular rhythm, normal heart sounds and intact distal pulses.  Tachycardia present.   No murmur heard. Pulses:      Radial pulses are 2+ on the right side, and 2+ on the left side.       Dorsalis pedis pulses are 2+ on the right side, and 2+ on the left side.  Pulmonary/Chest: Effort normal and breath sounds normal. No stridor. No respiratory distress. She has no decreased breath sounds. She has no wheezes. She has no rhonchi. She has no rales.  Equal chest expansion Breath sounds are clear without rales or rhonchi  Abdominal: Soft. Bowel sounds are normal. She  exhibits no mass. There is tenderness in the suprapubic area. There is guarding and CVA tenderness (right). There is no rebound.  Tenderness to palpation in the suprapubic region with mild voluntary guarding Mild right-sided CVA tenderness  Musculoskeletal: Normal range of motion. She exhibits no edema.  Lymphadenopathy:    She has no cervical adenopathy.  Neurological: She is alert and oriented to person, place, and time.  Speech is clear and goal oriented Moves extremities without ataxia  Skin: Skin is warm and dry. No rash noted. She is not diaphoretic. No erythema.  Psychiatric: She has a normal  mood and affect.  Nursing note and vitals reviewed.   ED Course  Procedures (including critical care time) Labs Review Labs Reviewed  BRAIN NATRIURETIC PEPTIDE - Abnormal; Notable for the following:    B Natriuretic Peptide 915.5 (*)    All other components within normal limits  CBC WITH DIFFERENTIAL/PLATELET - Abnormal; Notable for the following:    RBC 2.79 (*)    Hemoglobin 7.8 (*)    HCT 25.4 (*)    RDW 16.8 (*)    Platelets 523 (*)    Neutro Abs 8.5 (*)    All other components within normal limits  COMPREHENSIVE METABOLIC PANEL - Abnormal; Notable for the following:    CO2 17 (*)    Glucose, Bld 122 (*)    BUN 69 (*)    Creatinine, Ser 4.79 (*)    Calcium 7.9 (*)    Total Protein 6.0 (*)    Albumin 2.6 (*)    ALT 11 (*)    GFR calc non Af Amer 11 (*)    GFR calc Af Amer 13 (*)    All other components within normal limits  URINALYSIS, ROUTINE W REFLEX MICROSCOPIC (NOT AT [facility]) - Abnormal; Notable for the following:    APPearance CLOUDY (*)    Hgb urine dipstick SMALL (*)    Protein, ur 100 (*)    Leukocytes, UA MODERATE (*)    All other components within normal limits  URINE MICROSCOPIC-ADD ON - Abnormal; Notable for the following:    Squamous Epithelial / LPF FEW (*)    Bacteria, UA MANY (*)    All other components within normal limits  URINE CULTURE  PROTIME-INR  APTT  I-STAT TROPOININ, ED  POC URINE PREG, ED  I-STAT CG4 LACTIC ACID, ED    Imaging Review Ct Abdomen Pelvis Wo Contrast  06/12/2015   CLINICAL DATA:  Low abdominal pain with bloating and gas. Chronic kidney disease.  EXAM: CT ABDOMEN AND PELVIS WITHOUT CONTRAST  TECHNIQUE: Multidetector CT imaging of the abdomen and pelvis was performed following the standard protocol without IV contrast.  COMPARISON:  01/18/2015  FINDINGS: Lung bases: Moderate enlargement of the heart. Minimal amount of pleural fluid. Dependent lung base atelectasis. No pulmonary edema.  Liver, spleen, gallbladder, pancreas,  adrenal glands:  Unremarkable.  Kidneys, ureters, bladder: No renal stones. No hydronephrosis. Mass suggested from the lower pole right kidney on the prior exam is no longer evident. No evidence of a renal mass. Ureters are normal course and in caliber. Bladder is unremarkable.  Uterus and adnexa:  Unremarkable.  Lymph nodes:  No adenopathy.  Ascites:  None.  Gastrointestinal: There are scattered colonic diverticula. No diverticulitis. Colon otherwise unremarkable. Small bowel. Normal appendix.  Musculoskeletal: Mild compression fracture of T12. Schmorl's node along the upper endplate of L2. No osteoblastic or osteolytic lesions. Bony findings are stable.  IMPRESSION: 1. No acute findings. 2.  No evidence of bowel obstruction or ileus. 3. No renal or ureteral stones.  No hydronephrosis.   Electronically Signed   By: Amie Portland M.D.   On: 06/12/2015 18:50   Dg Chest 2 View  06/12/2015   CLINICAL DATA:  Shortness of breath, right chest pain  EXAM: CHEST  2 VIEW  COMPARISON:  05/23/2015  FINDINGS: Cardiomegaly is noted. Dual lead cardiac pacemaker is unchanged in position. No acute infiltrate or pulmonary edema. Mild thickening of right minor fissure.  IMPRESSION: No active cardiopulmonary disease.  Cardiomegaly.   Electronically Signed   By: Natasha Mead M.D.   On: 06/12/2015 16:31   I have personally reviewed and evaluated these images and lab results as part of my medical decision-making.   EKG Interpretation   Date/Time:  Sunday June 12 2015 14:50:28 EDT Ventricular Rate:  133 PR Interval:  118 QRS Duration: 70 QT Interval:  286 QTC Calculation: 425 R Axis:   -15 Text Interpretation:  Sinus tachycardia Cannot rule out Anterior infarct ,  age undetermined Abnormal ECG tachycardia new since previous Confirmed by  YAO  MD, DAVID ([id number]) on 06/12/2015 4:33:05 PM      MDM   Final diagnoses:  SLE (systemic lupus erythematosus)  Tachycardia  Anticoagulated  UTI (lower urinary tract  infection)   Bobbe Medico presents with c/o SOB and tachycardia. Patient has been compliant with her anticoagulate and recent VQ scan was without pulmonary embolism. Doubt that is the cause of her tachycardia today. She does present with malodorous urine, suprapubic abdominal pain. We'll begin workup for UTI/nephritis she has mild CVA tenderness. Patient is afebrile on arrival here in the emergency department.  No hypoxia, no rales, no peripheral edema - this does not clinically appear to be congestive heart failure.  Bedside cardiac ultrasound with Dr. Silverio Lay shows no evidence of significantly decreased ejection fraction, no evidence of pericardial effusion or cardiac tamponade.  6:03 PM Urinalysis with evidence of urinary tract infection. Suspect pyelonephritis at this time. Blood cell count 10.3 and perhaps abnormal due to her chronic steroids usage. Serum creatinine 4.79 but at baseline.  This x-ray without evidence of fluid overload.  CT scan pending to rule out abscess of the right kidney. Will plan for admission for failed outpatient treatment of UTI/pyelonephritis.  7:24 PM Discussed with Dr. Allena Katz who will admit to tele.  CT scan shows resolution of mass of the right kidney. No evidence of bowel obstruction, ileus, renal or ureteral stones.  No focal source of infection.  BP 155/109 mmHg  Pulse 94  Temp(Src) 98.5 F (36.9 C) (Rectal)  Resp 28  SpO2 100%  LMP 04/27/2015 (Approximate)    Dierdre Forth, PA-C 06/12/15 1924  Richardean Canal, MD 06/12/15 2209

## 2015-06-12 NOTE — H&P (Signed)
[facility] History and Physical  Patient: Paige Aguilar  MRN: [id number]  DOB: October 23, 1985  DOS: the patient was seen and examined on 06/12/2015 PCP: [name], MD  Referring physician: Dr. Silverio Lay Chief Complaint: Chest pain  HPI: Paige Aguilar is a 29 y.o. female with Past medical history of DVT, lupus on chronic prednisone, chronic systolic CHF, recurrent UTI, diabetes mellitus, hypothyroidism, chronic anemia, renal mass. The patient is presenting with complaints of palpitation along with cough. She mentions that this is ongoing since last 3-4 days. She initially started having frontal headaches and then sore throat and then cough with greenish expectoration. She also started having progressively worsening shortness of breath. She is started noticing that h her heart was racing too fast. And today she started having some complaints of chest pain which is located in the center and felt like sharp pain. She denies having any fever or chills denies having any nausea or vomiting denies having any diarrhea or constipation. She denies any abdominal pain. She does have back pain which she says is associated with her renal biopsy. She also complains of knee pain. She has not checked her weight since her recent discharge. patient is coming from home.  At her baseline ambulates without any support And is independent for most of her ADL; does manages her medication on her own.  Review of Systems: as mentioned in the history of present illness.  A comprehensive review of the other systems is negative.  Past Medical History  Diagnosis Date  . Lupus   . Arthritis   . Thyroid disease   . Diabetes mellitus without complication   . PE (pulmonary embolism)   . DVT (deep venous thrombosis)    Past Surgical History  Procedure Laterality Date  . Pacemaker insertion    . Cesarean section     Social History:  reports that she has never smoked. She does not have any smokeless  tobacco history on file. She reports that she does not drink alcohol or use illicit drugs.  Allergies  Allergen Reactions  . Amoxicillin Rash  . Ciprofloxacin Itching and Rash  . Sulfa Antibiotics Rash    History reviewed. No pertinent family history.  Prior to Admission medications   Medication Sig Start Date End Date Taking? Authorizing Provider  apixaban (ELIQUIS) 5 MG TABS tablet Take 5 mg by mouth 2 (two) times daily.   Yes Historical Provider, MD  carvedilol (COREG) 12.5 MG tablet Take 1 tablet (12.5 mg total) by mouth 2 (two) times daily with a meal. 05/02/15  Yes Leroy Sea, MD  diphenhydrAMINE (BENADRYL) 25 MG tablet Take 25 mg by mouth every 6 (six) hours as needed for itching or allergies.   Yes Historical Provider, MD  furosemide (LASIX) 20 MG tablet Take 1 tablet (20 mg total) by mouth daily. 05/02/15  Yes Leroy Sea, MD  isosorbide-hydrALAZINE (BIDIL) 20-37.5 MG per tablet Take 1 tablet by mouth 3 (three) times daily. 05/02/15  Yes Leroy Sea, MD  oxycodone (ROXICODONE) 30 MG immediate release tablet Take 30 mg by mouth every 4 (four) hours as needed for pain.   Yes Historical Provider, MD  predniSONE (DELTASONE) 20 MG tablet Take 20 mg by mouth daily with breakfast.   Yes Historical Provider, MD  QUEtiapine (SEROQUEL) 50 MG tablet Take 50 mg by mouth at bedtime.   Yes Historical Provider, MD  apixaban (ELIQUIS) 5 MG TABS tablet Take 1 tablet (5 mg total) by mouth 2 (two) times daily. 05/08/15  Leroy Sea, MD  ciprofloxacin (CIPRO) 500 MG tablet Take 500 mg by mouth 2 (two) times daily.    Historical Provider, MD  nitrofurantoin, macrocrystal-monohydrate, (MACROBID) 100 MG capsule Take 1 capsule (100 mg total) by mouth 2 (two) times daily. Patient not taking: Reported on 06/12/2015 05/19/15   Eyvonne Mechanic, PA-C  oxyCODONE-acetaminophen (PERCOCET/ROXICET) 5-325 MG per tablet Take 1 tablet by mouth every 4 (four) hours as needed for severe pain. 05/19/15    Eyvonne Mechanic, PA-C    Physical Exam: Filed Vitals:   06/12/15 1930 06/12/15 1945 06/12/15 2012 06/12/15 2053  BP: 153/117 151/102 152/108   Pulse:  124    Temp:   98.2 F (36.8 C)   TempSrc:   Oral   Resp:  18 20   Weight:    74.163 kg (163 lb 8 oz)  SpO2:  100% 100%     General: Alert, Awake and Oriented to Time, Place and Person. Appear in mild distress Eyes: PERRL ENT: Oral Mucosa clear moist. Neck: no JVD Cardiovascular: S1 and S2 Present, no Murmur, Peripheral Pulses Present Respiratory: Bilateral Air entry equal and Decreased,  Clear to Auscultation, no Crackles, no wheezes Abdomen: Bowel Sound present, Soft and no tenderness Skin: no Rash Extremities: trace Pedal edema, no calf tenderness Neurologic: Grossly no focal neuro deficit.  Labs on Admission:  CBC:  Recent Labs Lab 06/12/15 1456  WBC 10.3  NEUTROABS 8.5*  HGB 7.8*  HCT 25.4*  MCV 91.0  PLT 523*    CMP     Component Value Date/Time   NA 139 06/12/2015 1456   K 4.6 06/12/2015 1456   CL 110 06/12/2015 1456   CO2 17* 06/12/2015 1456   GLUCOSE 122* 06/12/2015 1456   BUN 69* 06/12/2015 1456   CREATININE 4.79* 06/12/2015 1456   CALCIUM 7.9* 06/12/2015 1456   PROT 6.0* 06/12/2015 1456   ALBUMIN 2.6* 06/12/2015 1456   AST 17 06/12/2015 1456   ALT 11* 06/12/2015 1456   ALKPHOS 65 06/12/2015 1456   BILITOT 0.4 06/12/2015 1456   GFRNONAA 11* 06/12/2015 1456   GFRAA 13* 06/12/2015 1456     Recent Labs Lab 06/12/15 2045  TROPONINI 0.03   BNP (last 3 results)  Recent Labs  04/27/15 1109 05/23/15 1450 06/12/15 1456  BNP 3778.7* 749.0* 915.5*    ProBNP (last 3 results) No results for input(s): PROBNP in the last 8760 hours.   Radiological Exams on Admission: Ct Abdomen Pelvis Wo Contrast  06/12/2015   CLINICAL DATA:  Low abdominal pain with bloating and gas. Chronic kidney disease.  EXAM: CT ABDOMEN AND PELVIS WITHOUT CONTRAST  TECHNIQUE: Multidetector CT imaging of the abdomen and  pelvis was performed following the standard protocol without IV contrast.  COMPARISON:  01/18/2015  FINDINGS: Lung bases: Moderate enlargement of the heart. Minimal amount of pleural fluid. Dependent lung base atelectasis. No pulmonary edema.  Liver, spleen, gallbladder, pancreas, adrenal glands:  Unremarkable.  Kidneys, ureters, bladder: No renal stones. No hydronephrosis. Mass suggested from the lower pole right kidney on the prior exam is no longer evident. No evidence of a renal mass. Ureters are normal course and in caliber. Bladder is unremarkable.  Uterus and adnexa:  Unremarkable.  Lymph nodes:  No adenopathy.  Ascites:  None.  Gastrointestinal: There are scattered colonic diverticula. No diverticulitis. Colon otherwise unremarkable. Small bowel. Normal appendix.  Musculoskeletal: Mild compression fracture of T12. Schmorl's node along the upper endplate of L2. No osteoblastic or osteolytic lesions. Bony findings are  stable.  IMPRESSION: 1. No acute findings. 2. No evidence of bowel obstruction or ileus. 3. No renal or ureteral stones.  No hydronephrosis.   Electronically Signed   By: Amie Portland M.D.   On: 06/12/2015 18:50   Dg Chest 2 View  06/12/2015   CLINICAL DATA:  Shortness of breath, right chest pain  EXAM: CHEST  2 VIEW  COMPARISON:  05/23/2015  FINDINGS: Cardiomegaly is noted. Dual lead cardiac pacemaker is unchanged in position. No acute infiltrate or pulmonary edema. Mild thickening of right minor fissure.  IMPRESSION: No active cardiopulmonary disease.  Cardiomegaly.   Electronically Signed   By: Natasha Mead M.D.   On: 06/12/2015 16:31   EKG: Independently reviewed. nonspecific ST and T waves changes, sinus tachycardia.  Assessment/Plan 1. Chest pain The patient is presenting with complaint of chest pain. This is associated with palpitation. EKG shows sinus tachycardia. Recent similar presentation was unremarkable for any acute event. We'll continue to monitor serial  troponin. Recent echogram showed systolic dysfunction with ejection fraction 25%. Continue aspirin. Nothing by mouth after midnight. Cardiology was consulted in the morning.  2. Cough with greenish expectoration. Possible pharyngitis. The patient is most likely presenting with pharyngitis. With her chronic prednisone use" her on nystatin oral solution as well as Cephalosporium. Follow cultures as well as influenza PCR.  3. Cystitis. The patient also complained of some lower suprapubic pain which is currently resolved. CT scan is unremarkable. Urine shows evidence of pyuria. Unclear whether this is associated with her SLE or recurrent UTI. Due to her recent biopsy of renal tissue I would treat her on broad-spectrum antibiotics with Elita Quick. Follow the cultures.  4. Anemia,  SLE (systemic lupus erythematosus) Continue close monitoring. Continue prednisone.  5. DVT (deep venous thrombosis) Continue Apixaban.  6. Renal mass Patient recommended to continue follow-up with urology.  7. Chronic kidney disease (CKD), stage IV (severe) We will check her weight and if elevated give her IV Lasix otherwise continue her home Lasix. Avoid other nephrotoxic medication and pharmacy consultation for antibiotics adjustment.  8.Back pain Continuing home medications as needed.  Nutrition: nothing by mouth after midnight DVT Prophylaxis: on therapeutic anticoagulation.  Advance goals of care discussion: Full code   Consults: cardiology  Family Communication: family was present at bedside, opportunity was given to ask question and all questions were answered satisfactorily at the time of interview. Disposition: Admitted as observation, telemetry unit. Estimated length of stay1-2 days  Author: Lynden Oxford, MD [facility] Pager: [phone] 06/12/2015  If 7PM-7AM, please contact night-coverage [email or web address] Password [email or web address]

## 2015-06-13 ENCOUNTER — Encounter (HOSPITAL_COMMUNITY): Payer: Self-pay

## 2015-06-13 DIAGNOSIS — I5043 Acute on chronic combined systolic (congestive) and diastolic (congestive) heart failure: Secondary | ICD-10-CM

## 2015-06-13 DIAGNOSIS — M329 Systemic lupus erythematosus, unspecified: Secondary | ICD-10-CM

## 2015-06-13 DIAGNOSIS — D649 Anemia, unspecified: Secondary | ICD-10-CM | POA: Diagnosis not present

## 2015-06-13 DIAGNOSIS — N2889 Other specified disorders of kidney and ureter: Secondary | ICD-10-CM | POA: Diagnosis not present

## 2015-06-13 DIAGNOSIS — N184 Chronic kidney disease, stage 4 (severe): Secondary | ICD-10-CM | POA: Diagnosis not present

## 2015-06-13 DIAGNOSIS — M5489 Other dorsalgia: Secondary | ICD-10-CM

## 2015-06-13 DIAGNOSIS — N3090 Cystitis, unspecified without hematuria: Secondary | ICD-10-CM

## 2015-06-13 DIAGNOSIS — R079 Chest pain, unspecified: Secondary | ICD-10-CM | POA: Diagnosis not present

## 2015-06-13 DIAGNOSIS — I420 Dilated cardiomyopathy: Secondary | ICD-10-CM

## 2015-06-13 DIAGNOSIS — R0782 Intercostal pain: Secondary | ICD-10-CM | POA: Diagnosis not present

## 2015-06-13 DIAGNOSIS — R000 Tachycardia, unspecified: Secondary | ICD-10-CM

## 2015-06-13 DIAGNOSIS — I5022 Chronic systolic (congestive) heart failure: Secondary | ICD-10-CM | POA: Diagnosis not present

## 2015-06-13 LAB — TROPONIN I
TROPONIN I: 0.04 ng/mL — AB (ref <0.031)
Troponin I: <0.03 ng/mL (ref <0.031)

## 2015-06-13 LAB — COMPREHENSIVE METABOLIC PANEL
ALT: 9 U/L — ABNORMAL LOW (ref 14–54)
AST: 13 U/L — ABNORMAL LOW (ref 15–41)
Albumin: 2.3 g/dL — ABNORMAL LOW (ref 3.5–5.0)
Alkaline Phosphatase: 50 U/L (ref 38–126)
Anion gap: 12 (ref 5–15)
BUN: 73 mg/dL — ABNORMAL HIGH (ref 6–20)
CO2: 16 mmol/L — ABNORMAL LOW (ref 22–32)
Calcium: 8.3 mg/dL — ABNORMAL LOW (ref 8.9–10.3)
Chloride: 115 mmol/L — ABNORMAL HIGH (ref 101–111)
Creatinine, Ser: 4.69 mg/dL — ABNORMAL HIGH (ref 0.44–1.00)
GFR calc Af Amer: 13 mL/min — ABNORMAL LOW (ref >60)
GFR calc non Af Amer: 12 mL/min — ABNORMAL LOW (ref >60)
Glucose, Bld: 83 mg/dL (ref 65–99)
Potassium: 4.9 mmol/L (ref 3.5–5.1)
Sodium: 143 mmol/L (ref 135–145)
Total Bilirubin: 0.3 mg/dL (ref 0.3–1.2)
Total Protein: 5.3 g/dL — ABNORMAL LOW (ref 6.5–8.1)

## 2015-06-13 LAB — IRON AND TIBC
Iron: 36 ug/dL (ref 28–170)
Saturation Ratios: 17 % (ref 10.4–31.8)
TIBC: 213 ug/dL — ABNORMAL LOW (ref 250–450)
UIBC: 177 ug/dL

## 2015-06-13 LAB — INFLUENZA PANEL BY PCR (TYPE A & B)
H1N1 flu by pcr: NOT DETECTED
INFLAPCR: NEGATIVE
Influenza B By PCR: NEGATIVE

## 2015-06-13 LAB — CBC WITH DIFFERENTIAL/PLATELET
BASOS ABS: 0 10*3/uL (ref 0.0–0.1)
Basophils Relative: 0 %
EOS PCT: 0 %
Eosinophils Absolute: 0 10*3/uL (ref 0.0–0.7)
HCT: 22.6 % — ABNORMAL LOW (ref 36.0–46.0)
HEMOGLOBIN: 7 g/dL — AB (ref 12.0–15.0)
LYMPHS PCT: 29 %
Lymphs Abs: 2.8 10*3/uL (ref 0.7–4.0)
MCH: 28.1 pg (ref 26.0–34.0)
MCHC: 31 g/dL (ref 30.0–36.0)
MCV: 90.8 fL (ref 78.0–100.0)
Monocytes Absolute: 0.8 10*3/uL (ref 0.1–1.0)
Monocytes Relative: 9 %
NEUTROS ABS: 5.8 10*3/uL (ref 1.7–7.7)
NEUTROS PCT: 62 %
PLATELETS: 445 10*3/uL — AB (ref 150–400)
RBC: 2.49 MIL/uL — AB (ref 3.87–5.11)
RDW: 17.1 % — ABNORMAL HIGH (ref 11.5–15.5)
WBC: 9.4 10*3/uL (ref 4.0–10.5)

## 2015-06-13 LAB — RETICULOCYTES
RBC.: 2.81 MIL/uL — AB (ref 3.87–5.11)
RETIC CT PCT: 1.8 % (ref 0.4–3.1)
Retic Count, Absolute: 50.6 10*3/uL (ref 19.0–186.0)

## 2015-06-13 LAB — MRSA PCR SCREENING: MRSA BY PCR: NEGATIVE

## 2015-06-13 LAB — VITAMIN B12: VITAMIN B 12: 294 pg/mL (ref 180–914)

## 2015-06-13 LAB — FERRITIN: FERRITIN: 286 ng/mL (ref 11–307)

## 2015-06-13 LAB — FOLATE: Folate: 9.8 ng/mL (ref >5.9)

## 2015-06-13 MED ORDER — FUROSEMIDE 20 MG PO TABS
20.0000 mg | ORAL_TABLET | Freq: Every day | ORAL | Status: DC
  Administered 2015-06-13 – 2015-06-14 (×2): 20 mg via ORAL
  Filled 2015-06-13 (×2): qty 1

## 2015-06-13 MED ORDER — NYSTATIN 100000 UNIT/ML MT SUSP
5.0000 mL | Freq: Four times a day (QID) | OROMUCOSAL | Status: DC
  Filled 2015-06-13 (×2): qty 5

## 2015-06-13 MED ORDER — HYDRALAZINE HCL 25 MG PO TABS
25.0000 mg | ORAL_TABLET | Freq: Three times a day (TID) | ORAL | Status: DC
  Administered 2015-06-13 – 2015-06-14 (×4): 25 mg via ORAL
  Filled 2015-06-13 (×4): qty 1

## 2015-06-13 MED ORDER — CARVEDILOL 25 MG PO TABS
25.0000 mg | ORAL_TABLET | Freq: Two times a day (BID) | ORAL | Status: DC
  Administered 2015-06-13 – 2015-06-14 (×2): 25 mg via ORAL
  Filled 2015-06-13 (×2): qty 1

## 2015-06-13 NOTE — Progress Notes (Signed)
[facility] PROGRESS NOTE  Paige Aguilar UJW:[id number] DOB: [birth year] DOA: 06/12/2015 PCP: Paige Cashing, MD  Assessment/Plan: Principal Problem:   Chest pain - Last troponin within normal limits. - Atypical, on exam patient had no complaints of chest pain - Most likely also affected by chronic kidney disease - Cardiology on board - Most likely pleuritic, improving on current antibiotics regimen  Anemia - We'll obtain anemia panel  Active Problems:   SLE (systemic lupus erythematosus) - Stable continue home medication regimen   DCM (dilated cardiomyopathy)   Tachycardia - Cardiology on board   Renal mass - No evidence of renal mass on follow-up CT scan   Chronic kidney disease (CKD), stage IV (severe) - Stable   Chronic systolic heart failure - Continue current regimen. Cardiology on board - Stable   Code Status: Full Family Communication: Discussed directly with patient Disposition Plan: Pending improvement in condition   Consultants:  Cardiology  Procedures:  None  Antibiotics:  Elita Quick  HPI/Subjective: Patient has no new complaints. No acute issues overnight  Objective: Filed Vitals:   06/13/15 1332  BP: 129/93  Pulse: 111  Temp: 98.5 F (36.9 C)  Resp: 20    Intake/Output Summary (Last 24 hours) at 06/13/15 1647 Last data filed at 06/13/15 1300  Gross per 24 hour  Intake    240 ml  Output   1601 ml  Net  -1361 ml   Filed Weights   06/12/15 2053 06/13/15 0505  Weight: 74.163 kg (163 lb 8 oz) 74.39 kg (164 lb)    Exam:   General:  Patient in no acute distress, alert and awake  Cardiovascular: Regular rate and rhythm, no rubs  Respiratory: No increased work of breathing, equal chest rise, no accessory muscle use, mild rales  Abdomen: Soft, nondistended, no guarding  Musculoskeletal: No cyanosis or clubbing   Data Reviewed: Basic Metabolic Panel:  Recent Labs Lab 06/12/15 1456 06/13/15 0850  NA 139 143  K 4.6  4.9  CL 110 115*  CO2 17* 16*  GLUCOSE 122* 83  BUN 69* 73*  CREATININE 4.79* 4.69*  CALCIUM 7.9* 8.3*   Liver Function Tests:  Recent Labs Lab 06/12/15 1456 06/13/15 0850  AST 17 13*  ALT 11* 9*  ALKPHOS 65 50  BILITOT 0.4 0.3  PROT 6.0* 5.3*  ALBUMIN 2.6* 2.3*   No results for input(s): LIPASE, AMYLASE in the last 168 hours. No results for input(s): AMMONIA in the last 168 hours. CBC:  Recent Labs Lab 06/12/15 1456 06/13/15 0850  WBC 10.3 9.4  NEUTROABS 8.5* 5.8  HGB 7.8* 7.0*  HCT 25.4* 22.6*  MCV 91.0 90.8  PLT 523* 445*   Cardiac Enzymes:  Recent Labs Lab 06/12/15 2045 06/13/15 0240 06/13/15 0850  TROPONINI 0.03 0.04* <0.03   BNP (last 3 results)  Recent Labs  04/27/15 1109 05/23/15 1450 06/12/15 1456  BNP 3778.7* 749.0* 915.5*    ProBNP (last 3 results) No results for input(s): PROBNP in the last 8760 hours.  CBG: No results for input(s): GLUCAP in the last 168 hours.  Recent Results (from the past 240 hour(s))  MRSA PCR Screening     Status: None   Collection Time: 06/12/15 10:43 PM  Result Value Ref Range Status   MRSA by PCR NEGATIVE NEGATIVE Final    Comment:        The GeneXpert MRSA Assay (FDA approved for NASAL specimens only), is one component of a comprehensive MRSA colonization surveillance program. It is not intended to  diagnose MRSA infection nor to guide or monitor treatment for MRSA infections.      Studies: Ct Abdomen Pelvis Wo Contrast  06/12/2015   CLINICAL DATA:  Low abdominal pain with bloating and gas. Chronic kidney disease.  EXAM: CT ABDOMEN AND PELVIS WITHOUT CONTRAST  TECHNIQUE: Multidetector CT imaging of the abdomen and pelvis was performed following the standard protocol without IV contrast.  COMPARISON:  01/18/2015  FINDINGS: Lung bases: Moderate enlargement of the heart. Minimal amount of pleural fluid. Dependent lung base atelectasis. No pulmonary edema.  Liver, spleen, gallbladder, pancreas, adrenal  glands:  Unremarkable.  Kidneys, ureters, bladder: No renal stones. No hydronephrosis. Mass suggested from the lower pole right kidney on the prior exam is no longer evident. No evidence of a renal mass. Ureters are normal course and in caliber. Bladder is unremarkable.  Uterus and adnexa:  Unremarkable.  Lymph nodes:  No adenopathy.  Ascites:  None.  Gastrointestinal: There are scattered colonic diverticula. No diverticulitis. Colon otherwise unremarkable. Small bowel. Normal appendix.  Musculoskeletal: Mild compression fracture of T12. Schmorl's node along the upper endplate of L2. No osteoblastic or osteolytic lesions. Bony findings are stable.  IMPRESSION: 1. No acute findings. 2. No evidence of bowel obstruction or ileus. 3. No renal or ureteral stones.  No hydronephrosis.   Electronically Signed   By: Amie Portland M.D.   On: 06/12/2015 18:50   Dg Chest 2 View  06/12/2015   CLINICAL DATA:  Shortness of breath, right chest pain  EXAM: CHEST  2 VIEW  COMPARISON:  05/23/2015  FINDINGS: Cardiomegaly is noted. Dual lead cardiac pacemaker is unchanged in position. No acute infiltrate or pulmonary edema. Mild thickening of right minor fissure.  IMPRESSION: No active cardiopulmonary disease.  Cardiomegaly.   Electronically Signed   By: Natasha Mead M.D.   On: 06/12/2015 16:31    Scheduled Meds: . apixaban  5 mg Oral BID  . aspirin EC  81 mg Oral Daily  . carvedilol  25 mg Oral BID WC  . cefTAZidime (FORTAZ)  IV  2 g Intravenous Q24H  . furosemide  20 mg Oral Daily  . hydrALAZINE  25 mg Oral 3 times per day  . nystatin  5 mL Oral QID  . predniSONE  20 mg Oral Q breakfast  . QUEtiapine  50 mg Oral QHS   Continuous Infusions:   Time spent: > 35 minutes  [name]  [facility] Pager [phone] If 7PM-7AM, please contact night-coverage at [email or web address], password [facility] 06/13/2015, 4:47 PM

## 2015-06-13 NOTE — Consult Note (Signed)
CONSULTATION NOTE  Reason for Consult: NSTEMI  Requesting Physician: Dr. Wendee Beavers  Cardiologist: Dr. Aundra Dubin  HPI: This is a 29 y.o. female with a past medical history significant for lupus on chronic prednisone therapy, history of venous thromboembolism (apparently noncompliant on anticoagulation), CKD IV, and new onset acute systolic heart failure in August 2016. Echocardiogram showed an EF of 25-30%. She was admitted with acute respiratory failure by pulmonary and critical care medicine and required emergent intubation and mechanical ventilation. 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 During that hospitalization she was seen by Dr. Aundra Dubin and was diuresed. She was placed on Lasix as well as BiDil and Coreg. And outpatient follow-up was recommended however that never occurred. She was also noted to have a history of short QTC with syncope. Based on that she had an AICD implanted in 2014 in [place] at [facility].  She is now admitted for chest pain. She also reports palpitations and cough which is been going on for 3-4 days. She initially reported frontal headaches and sore throat than a productive cough with green sputum. She's had some progressive worsening of shortness of breath. With regards to her chest pain is centrally located and does not radiate. Currently it is resolved. Laboratory work demonstrates a borderline elevated troponin of 0.04, after 2 subsequent negative troponins at 0.03. BNP is elevated at 915, which is higher than her previous ER presentation and the end of August at 749. Labs are also significant for anemia. Influenza swab was negative. Urinalysis is suggestive of urinary tract infection with cloudy appearance, many bacteria, small hemoglobin amount, moderate leukocytes, nitrite negative and white blood cells TNTC.  PMHx:  Past Medical History  Diagnosis  Date  . Lupus   . Arthritis   . Thyroid disease   . Diabetes mellitus without complication   . PE (pulmonary embolism)   . DVT (deep venous thrombosis)    Past Surgical History  Procedure Laterality Date  . Pacemaker insertion    . Cesarean section      FAMHx: History reviewed. No pertinent family history.  SOCHx:  reports that she has never smoked. She does not have any smokeless tobacco history on file. She reports that she does not drink alcohol or use illicit drugs.  ALLERGIES: Allergies  Allergen Reactions  . Amoxicillin Rash  . Ciprofloxacin Itching and Rash  . Sulfa Antibiotics Rash    ROS: A comprehensive review of systems was negative except for: Constitutional: positive for fatigue and malaise Respiratory: positive for cough and sputum Cardiovascular: positive for chest pain Genitourinary: positive for dysuria Musculoskeletal: positive for back pain and stiff joints  HOME MEDICATIONS: Prescriptions prior to admission  Medication Sig Dispense Refill Last Dose  . apixaban (ELIQUIS) 5 MG TABS tablet Take 5 mg by mouth 2 (two) times daily.   06/12/2015 at Unknown time  . carvedilol (COREG) 12.5 MG tablet Take 1 tablet (12.5 mg total) by mouth 2 (two) times daily with a meal. 60 tablet 0 06/12/2015 at 9am  . diphenhydrAMINE (BENADRYL) 25 MG tablet Take 25 mg by mouth every 6 (six) hours as needed for itching or allergies.   Past Week at Unknown time  . furosemide (LASIX) 20 MG tablet Take 1 tablet (20 mg total) by mouth daily. 30 tablet 0 06/12/2015 at Unknown time  . isosorbide-hydrALAZINE (BIDIL) 20-37.5 MG per tablet Take 1 tablet by  mouth 3 (three) times daily. 90 tablet 0 06/12/2015 at Unknown time  . oxycodone (ROXICODONE) 30 MG immediate release tablet Take 30 mg by mouth every 4 (four) hours as needed for pain.   06/11/2015 at Unknown time  . predniSONE (DELTASONE) 20 MG tablet Take 20 mg by mouth daily with breakfast.   06/12/2015 at Unknown time  . QUEtiapine  (SEROQUEL) 50 MG tablet Take 50 mg by mouth at bedtime.   06/11/2015 at Unknown time  . apixaban (ELIQUIS) 5 MG TABS tablet Take 1 tablet (5 mg total) by mouth 2 (two) times daily. 60 tablet 1 05/23/2015 at Unknown time  . ciprofloxacin (CIPRO) 500 MG tablet Take 500 mg by mouth 2 (two) times daily.   Past Week at Unknown time  . nitrofurantoin, macrocrystal-monohydrate, (MACROBID) 100 MG capsule Take 1 capsule (100 mg total) by mouth 2 (two) times daily. (Patient not taking: Reported on 06/12/2015) 10 capsule 0 Completed Course at Unknown time  . oxyCODONE-acetaminophen (PERCOCET/ROXICET) 5-325 MG per tablet Take 1 tablet by mouth every 4 (four) hours as needed for severe pain. 6 tablet 0     HOSPITAL MEDICATIONS: I have reviewed the patient's current medications.  VITALS: Blood pressure 132/96, pulse 109, temperature 98.4 F (36.9 C), temperature source Oral, resp. rate 20, height 4' 11"  (1.499 m), weight 164 lb (74.39 kg), last menstrual period 04/27/2015, SpO2 100 %.  PHYSICAL EXAM: General appearance: alert and no distress Neck: no carotid bruit and no JVD Lungs: clear to auscultation bilaterally Heart: regular tachycardia Abdomen: soft, non-tender; bowel sounds normal; no masses,  no organomegaly Extremities: extremities normal, atraumatic, no cyanosis or edema Pulses: 2+ and symmetric Skin: Skin color, texture, turgor normal. No rashes or lesions Neurologic: Grossly normal Psych: Pleasant  LABS: Results for orders placed or performed during the hospital encounter of 06/12/15 (from the past 48 hour(s))  Brain natriuretic peptide     Status: Abnormal   Collection Time: 06/12/15  2:56 PM  Result Value Ref Range   B Natriuretic Peptide 915.5 (H) 0.0 - 100.0 pg/mL  CBC with Differential     Status: Abnormal   Collection Time: 06/12/15  2:56 PM  Result Value Ref Range   WBC 10.3 4.0 - 10.5 K/uL   RBC 2.79 (L) 3.87 - 5.11 MIL/uL   Hemoglobin 7.8 (L) 12.0 - 15.0 g/dL   HCT 25.4 (L)  36.0 - 46.0 %   MCV 91.0 78.0 - 100.0 fL   MCH 28.0 26.0 - 34.0 pg   MCHC 30.7 30.0 - 36.0 g/dL   RDW 16.8 (H) 11.5 - 15.5 %   Platelets 523 (H) 150 - 400 K/uL   Neutrophils Relative % 82 %   Neutro Abs 8.5 (H) 1.7 - 7.7 K/uL   Lymphocytes Relative 13 %   Lymphs Abs 1.3 0.7 - 4.0 K/uL   Monocytes Relative 5 %   Monocytes Absolute 0.5 0.1 - 1.0 K/uL   Eosinophils Relative 0 %   Eosinophils Absolute 0.0 0.0 - 0.7 K/uL   Basophils Relative 0 %   Basophils Absolute 0.0 0.0 - 0.1 K/uL  Comprehensive metabolic panel     Status: Abnormal   Collection Time: 06/12/15  2:56 PM  Result Value Ref Range   Sodium 139 135 - 145 mmol/L   Potassium 4.6 3.5 - 5.1 mmol/L   Chloride 110 101 - 111 mmol/L   CO2 17 (L) 22 - 32 mmol/L   Glucose, Bld 122 (H) 65 - 99 mg/dL  BUN 69 (H) 6 - 20 mg/dL   Creatinine, Ser 4.79 (H) 0.44 - 1.00 mg/dL   Calcium 7.9 (L) 8.9 - 10.3 mg/dL   Total Protein 6.0 (L) 6.5 - 8.1 g/dL   Albumin 2.6 (L) 3.5 - 5.0 g/dL   AST 17 15 - 41 U/L   ALT 11 (L) 14 - 54 U/L   Alkaline Phosphatase 65 38 - 126 U/L   Total Bilirubin 0.4 0.3 - 1.2 mg/dL   GFR calc non Af Amer 11 (L) >60 mL/min   GFR calc Af Amer 13 (L) >60 mL/min    Comment: (NOTE) The eGFR has been calculated using the CKD EPI equation. This calculation has not been validated in all clinical situations. eGFR's persistently <60 mL/min signify possible Chronic Kidney Disease.    Anion gap 12 5 - 15  POC urine preg, ED (not at [facility])     Status: None   Collection Time: 06/12/15  4:20 PM  Result Value Ref Range   Preg Test, Ur NEGATIVE NEGATIVE    Comment:        THE SENSITIVITY OF THIS METHODOLOGY IS >24 mIU/mL   Urinalysis, Routine w reflex microscopic (not at [facility])     Status: Abnormal   Collection Time: 06/12/15  4:49 PM  Result Value Ref Range   Color, Urine YELLOW YELLOW   APPearance CLOUDY (A) CLEAR   Specific Gravity, Urine 1.011 1.005 - 1.030   pH 6.0 5.0 - 8.0   Glucose, UA NEGATIVE NEGATIVE mg/dL     Hgb urine dipstick SMALL (A) NEGATIVE   Bilirubin Urine NEGATIVE NEGATIVE   Ketones, ur NEGATIVE NEGATIVE mg/dL   Protein, ur 100 (A) NEGATIVE mg/dL   Urobilinogen, UA 0.2 0.0 - 1.0 mg/dL   Nitrite NEGATIVE NEGATIVE   Leukocytes, UA MODERATE (A) NEGATIVE  Urine microscopic-add on     Status: Abnormal   Collection Time: 06/12/15  4:49 PM  Result Value Ref Range   Squamous Epithelial / LPF FEW (A) RARE   WBC, UA TOO NUMEROUS TO COUNT <3 WBC/hpf   RBC / HPF 7-10 <3 RBC/hpf   Bacteria, UA MANY (A) RARE  I-Stat Troponin, ED (not at [facility])     Status: None   Collection Time: 06/12/15  6:08 PM  Result Value Ref Range   Troponin i, poc 0.03 0.00 - 0.08 ng/mL   Comment 3            Comment: Due to the release kinetics of cTnI, a negative result within the first hours of the onset of symptoms does not rule out myocardial infarction with certainty. If myocardial infarction is still suspected, repeat the test at appropriate intervals.   I-Stat CG4 Lactic Acid, ED     Status: None   Collection Time: 06/12/15  6:09 PM  Result Value Ref Range   Lactic Acid, Venous 1.61 0.5 - 2.0 mmol/L  Urine rapid drug screen (hosp performed)     Status: None   Collection Time: 06/12/15  8:23 PM  Result Value Ref Range   Opiates NONE DETECTED NONE DETECTED   Cocaine NONE DETECTED NONE DETECTED   Benzodiazepines NONE DETECTED NONE DETECTED   Amphetamines NONE DETECTED NONE DETECTED   Tetrahydrocannabinol NONE DETECTED NONE DETECTED   Barbiturates NONE DETECTED NONE DETECTED    Comment:        DRUG SCREEN FOR MEDICAL PURPOSES ONLY.  IF CONFIRMATION IS NEEDED FOR ANY PURPOSE, NOTIFY LAB WITHIN 5 DAYS.  LOWEST DETECTABLE LIMITS FOR URINE DRUG SCREEN Drug Class       Cutoff (ng/mL) Amphetamine      1000 Barbiturate      200 Benzodiazepine   229 Tricyclics       798 Opiates          300 Cocaine          300 THC              50   Protime-INR     Status: Abnormal   Collection Time: 06/12/15   8:45 PM  Result Value Ref Range   Prothrombin Time 16.1 (H) 11.6 - 15.2 seconds   INR 1.28 0.00 - 1.49  Troponin I (q 6hr x 3)     Status: None   Collection Time: 06/12/15  8:45 PM  Result Value Ref Range   Troponin I 0.03 <0.031 ng/mL    Comment:        NO INDICATION OF MYOCARDIAL INJURY.   Lactic acid, plasma     Status: None   Collection Time: 06/12/15  8:45 PM  Result Value Ref Range   Lactic Acid, Venous 1.3 0.5 - 2.0 mmol/L  Strep pneumoniae urinary antigen     Status: None   Collection Time: 06/12/15 10:34 PM  Result Value Ref Range   Strep Pneumo Urinary Antigen NEGATIVE NEGATIVE    Comment:        Infection due to S. pneumoniae cannot be absolutely ruled out since the antigen present may be below the detection limit of the test.   MRSA PCR Screening     Status: None   Collection Time: 06/12/15 10:43 PM  Result Value Ref Range   MRSA by PCR NEGATIVE NEGATIVE    Comment:        The GeneXpert MRSA Assay (FDA approved for NASAL specimens only), is one component of a comprehensive MRSA colonization surveillance program. It is not intended to diagnose MRSA infection nor to guide or monitor treatment for MRSA infections.   Troponin I (q 6hr x 3)     Status: Abnormal   Collection Time: 06/13/15  2:40 AM  Result Value Ref Range   Troponin I 0.04 (H) <0.031 ng/mL    Comment:        PERSISTENTLY INCREASED TROPONIN VALUES IN THE RANGE OF 0.04-0.49 ng/mL CAN BE SEEN IN:       -UNSTABLE ANGINA       -CONGESTIVE HEART FAILURE       -MYOCARDITIS       -CHEST TRAUMA       -ARRYHTHMIAS       -LATE PRESENTING MYOCARDIAL INFARCTION       -COPD   CLINICAL FOLLOW-UP RECOMMENDED.     IMAGING: Ct Abdomen Pelvis Wo Contrast  06/12/2015   CLINICAL DATA:  Low abdominal pain with bloating and gas. Chronic kidney disease.  EXAM: CT ABDOMEN AND PELVIS WITHOUT CONTRAST  TECHNIQUE: Multidetector CT imaging of the abdomen and pelvis was performed following the standard protocol  without IV contrast.  COMPARISON:  01/18/2015  FINDINGS: Lung bases: Moderate enlargement of the heart. Minimal amount of pleural fluid. Dependent lung base atelectasis. No pulmonary edema.  Liver, spleen, gallbladder, pancreas, adrenal glands:  Unremarkable.  Kidneys, ureters, bladder: No renal stones. No hydronephrosis. Mass suggested from the lower pole right kidney on the prior exam is no longer evident. No evidence of a renal mass. Ureters are normal course and in caliber. Bladder is unremarkable.  Uterus and  adnexa:  Unremarkable.  Lymph nodes:  No adenopathy.  Ascites:  None.  Gastrointestinal: There are scattered colonic diverticula. No diverticulitis. Colon otherwise unremarkable. Small bowel. Normal appendix.  Musculoskeletal: Mild compression fracture of T12. Schmorl's node along the upper endplate of L2. No osteoblastic or osteolytic lesions. Bony findings are stable.  IMPRESSION: 1. No acute findings. 2. No evidence of bowel obstruction or ileus. 3. No renal or ureteral stones.  No hydronephrosis.   Electronically Signed   By: Lajean Manes M.D.   On: 06/12/2015 18:50   Dg Chest 2 View  06/12/2015   CLINICAL DATA:  Shortness of breath, right chest pain  EXAM: CHEST  2 VIEW  COMPARISON:  05/23/2015  FINDINGS: Cardiomegaly is noted. Dual lead cardiac pacemaker is unchanged in position. No acute infiltrate or pulmonary edema. Mild thickening of right minor fissure.  IMPRESSION: No active cardiopulmonary disease.  Cardiomegaly.   Electronically Signed   By: Lahoma Crocker M.D.   On: 06/12/2015 16:31    HOSPITAL DIAGNOSES: Principal Problem:   Chest pain Active Problems:   Anemia   SLE (systemic lupus erythematosus)   DCM (dilated cardiomyopathy)   Tachycardia   DVT (deep venous thrombosis)   Renal mass   Chronic kidney disease (CKD), stage IV (severe)   Chronic systolic heart failure   Pharyngitis   Cystitis   Back pain   IMPRESSION: 1. Borderline elevated troponin - related to  cardiomyopathy, chest pain resolved 2. CKD IV 3. Acute on chronic systolic congestive heart failure, NYHA Class III symptoms 4. S/p AICD for short QTc  RECOMMENDATION: 1. Ms. Vorhees had chest, back and right knee pain which developed about 1 day after URI symptoms of right earache, sinus congestion, sore throat and productive cough - she also has UTI. Troponin is mildly elevated at 0.04, likely related to cardiomyopathy and CKD IV - obstructive CAD or ACS is not suspected. She denies firing of her AICD (she does not know device, I could not identify in records).  Exam does not suggest decompensated CHF - although she reports some weight gain at home. She reports headaches and intolerance to Bidil (has lead to non-compliance at home)  - will switch to hydralazine. There is room to increase her carvedilol to 25 mg BID. Cannot use ACE-I or ARB due to CKD IV. Would restart home lasix at home dose of 20 mg daily today. Will need follow-up in cardiology clinic with a mid-level provider in 1-2 weeks after discharge with ICD interrogation/device clinic establishment.  Ok to resume diet today.   Thanks for the consultation.  Time Spent Directly with Patient: 45 minutes  Pixie Casino, MD, [facility] Attending Cardiologist [place] 06/13/2015, 8:34 AM

## 2015-06-13 NOTE — Progress Notes (Signed)
MD Allena Katz notified of pts 2nd troponin level.  No new orders at this time.  Will cont to mont pt closely.  Erenest Rasher, RN

## 2015-06-14 DIAGNOSIS — N3000 Acute cystitis without hematuria: Secondary | ICD-10-CM

## 2015-06-14 DIAGNOSIS — I5043 Acute on chronic combined systolic (congestive) and diastolic (congestive) heart failure: Secondary | ICD-10-CM | POA: Diagnosis not present

## 2015-06-14 DIAGNOSIS — D649 Anemia, unspecified: Secondary | ICD-10-CM | POA: Diagnosis not present

## 2015-06-14 DIAGNOSIS — R079 Chest pain, unspecified: Secondary | ICD-10-CM | POA: Diagnosis not present

## 2015-06-14 DIAGNOSIS — N184 Chronic kidney disease, stage 4 (severe): Secondary | ICD-10-CM | POA: Diagnosis not present

## 2015-06-14 DIAGNOSIS — M329 Systemic lupus erythematosus, unspecified: Secondary | ICD-10-CM | POA: Diagnosis not present

## 2015-06-14 LAB — CBC
HCT: 24.1 % — ABNORMAL LOW (ref 36.0–46.0)
HEMOGLOBIN: 7.3 g/dL — AB (ref 12.0–15.0)
MCH: 27.7 pg (ref 26.0–34.0)
MCHC: 30.3 g/dL (ref 30.0–36.0)
MCV: 91.3 fL (ref 78.0–100.0)
Platelets: 481 10*3/uL — ABNORMAL HIGH (ref 150–400)
RBC: 2.64 MIL/uL — ABNORMAL LOW (ref 3.87–5.11)
RDW: 16.8 % — ABNORMAL HIGH (ref 11.5–15.5)
WBC: 8.1 10*3/uL (ref 4.0–10.5)

## 2015-06-14 LAB — LEGIONELLA ANTIGEN, URINE

## 2015-06-14 MED ORDER — HYDRALAZINE HCL 25 MG PO TABS: 25.0000 mg | ORAL_TABLET | Freq: Three times a day (TID) | ORAL | Status: AC

## 2015-06-14 MED ORDER — CEFDINIR 300 MG PO CAPS: 300.0000 mg | ORAL_CAPSULE | Freq: Two times a day (BID) | ORAL | Status: DC

## 2015-06-14 NOTE — Progress Notes (Signed)
    Subjective:  Feeling ok. Breathing improved. No chest pain.  Objective:  Vital Signs in the last 24 hours: Temp:  [98.5 F (36.9 C)-98.6 F (37 C)] 98.6 F (37 C) (09/20 0548) Pulse Rate:  [107-114] 107 (09/20 0548) Resp:  [18-20] 18 (09/20 0548) BP: (120-129)/(80-93) 120/80 mmHg (09/20 0548) SpO2:  [99 %-100 %] 100 % (09/20 0548) Weight:  [164 lb 14.4 oz (74.798 kg)] 164 lb 14.4 oz (74.798 kg) (09/20 0548)  Intake/Output from previous day: 09/19 0701 - 09/20 0700 In: 240 [P.O.:240] Out: 1501 [Urine:1500; Stool:1]  Physical Exam: Pt is alert and oriented, pleasant overweight woman in NAD HEENT: normal Neck: JVP - normal Lungs: CTA bilaterally CV: RRR without murmur Abd: soft, NT, Positive BS, no hepatomegaly Ext: trace edema, distal pulses intact and equal Skin: warm/dry no rash  Lab Results:  Recent Labs  06/13/15 0850 06/14/15 0100  WBC 9.4 8.1  HGB 7.0* 7.3*  PLT 445* 481*    Recent Labs  06/12/15 1456 06/13/15 0850  NA 139 143  K 4.6 4.9  CL 110 115*  CO2 17* 16*  GLUCOSE 122* 83  BUN 69* 73*  CREATININE 4.79* 4.69*    Recent Labs  06/13/15 0240 06/13/15 0850  TROPONINI 0.04* <0.03    Cardiac Studies: 2D Echo 05/01/2015: Study Conclusions  - Left ventricle: The cavity size was mildly dilated. Wall thickness was normal. Systolic function was severely reduced. The estimated ejection fraction was in the range of 25% to 30%. Severe diffuse hypokinesis with no identifiable regional variations. Doppler parameters are consistent with abnormal left ventricular relaxation (grade 1 diastolic dysfunction). No evidence of thrombus. - Mitral valve: There was mild regurgitation. - Pericardium, extracardiac: A trivial pericardial effusion was identified.  Tele: Personally reviewed. Normal sinus rhythm  Assessment/Plan:  1. Acute on chronic systolic heart failure: overall appears compensated at present despite elevated BNP. Suspect  CKD IV playing significant role. Medication plan as outlined per Dr Rennis Golden yesterday.   2. S/P ICD: outpatient device clinic follow-up to be arranged  3. CKD IV: stable  4. Anemia: per primary team  Will make outpatient appointments for follow-up. Otherwise no inpatient cardiac issues at present. Please call if questions. Cecille Po, M.D. 06/14/2015, 8:34 AM

## 2015-06-14 NOTE — Progress Notes (Signed)
Reviewed discharge instructions with patient. Escorted patient out via wheelchair. Iv removed.   Caraher, Charlaine Dalton RN

## 2015-06-14 NOTE — Discharge Summary (Addendum)
Physician Discharge Summary  Tawni Melkonian YQM:[id number] DOB: Oct 04, 1985 DOA: 06/12/2015  PCP: [name], MD  Admit date: 06/12/2015 Discharge date: 06/14/2015  Time spent: > 35 minutes  Recommendations for Outpatient Follow-up:  1. Reassess hemoglobin levels 2. Will be discharged on 3rd generation cephalosporin for 5 more days to complete a 7 day treatment regimen  Discharge Diagnoses:  Principal Problem:   Chest pain Active Problems:   Anemia   SLE (systemic lupus erythematosus)   DCM (dilated cardiomyopathy)   Tachycardia   DVT (deep venous thrombosis)   Renal mass   Chronic kidney disease (CKD), stage IV (severe)   Chronic systolic heart failure   Pharyngitis   Cystitis   Back pain   Acute on chronic combined systolic and diastolic HF (heart failure), NYHA class 3   Discharge Condition: stable  Diet recommendation: cardiac diet  Filed Weights   06/12/15 2053 06/13/15 0505 06/14/15 0548  Weight: 74.163 kg (163 lb 8 oz) 74.39 kg (164 lb) 74.798 kg (164 lb 14.4 oz)    History of present illness:  29 y.o. female with Past medical history of DVT, lupus on chronic prednisone, chronic systolic CHF, recurrent UTI, diabetes mellitus, hypothyroidism, chronic anemia who presented to the hospital complaining of chest discomfort, cough, and foul smelling urine.  Hospital Course:  Chest pain - Last troponin within normal limits. - Atypical, on exam patient had no complaints of chest pain - Elevated troponin initially most likely also affected by chronic kidney disease - Cardiology on board and transitioned patient from bidil to hydralazine - Most likely pleuritic, improving on current antibiotics regimen. Will discharge on 3rd generation cephalosporin given improvement on this regimen while in house.  Presumed UTI - treat for 7 days with omnicef. Pt to f/u with pcp for further monitoring and recommendations.  Anemia - addendum: anemia panel reviewed and iron  levels WNL. Most likely related to CKD  Active Problems:  SLE (systemic lupus erythematosus) - Stable continue home medication regimen  DCM (dilated cardiomyopathy)  Tachycardia - Cardiology on board and recommended no further work up. May be due to ongoing pain related to her chronic medical conditions. She is to continue her home pain medication regimen.  Renal mass - No evidence of renal mass on follow-up CT scan  Chronic kidney disease (CKD), stage IV (severe) - Stable  Chronic systolic heart failure - Continue current regimen. Cardiology on board - Stable   Procedures:  None  Consultations:  Cardiology  Discharge Exam: Filed Vitals:   06/14/15 0548  BP: 120/80  Pulse: 107  Temp: 98.6 F (37 C)  Resp: 18    General: Pt in nad, alert and awake Cardiovascular: rrr, no mrg Respiratory: no increased wob, no wheezes, equal chest rise.  Discharge Instructions   Discharge Instructions    Call MD for:  difficulty breathing, headache or visual disturbances    Complete by:  As directed      Call MD for:  redness, tenderness, or signs of infection (pain, swelling, redness, odor or green/yellow discharge around incision site)    Complete by:  As directed      Call MD for:  severe uncontrolled pain    Complete by:  As directed      Call MD for:  temperature >100.4    Complete by:  As directed      Diet - low sodium heart healthy    Complete by:  As directed      Discharge instructions  Complete by:  As directed   Please be sure to follow up with your cardiologist and primary care physician within the next 1-2 weeks at their offices post discharge.     Increase activity slowly    Complete by:  As directed           Current Discharge Medication List    START taking these medications   Details  cefdinir (OMNICEF) 300 MG capsule Take 1 capsule (300 mg total) by mouth 2 (two) times daily. Qty: 10 capsule, Refills: 0    hydrALAZINE (APRESOLINE) 25 MG  tablet Take 1 tablet (25 mg total) by mouth every 8 (eight) hours. Qty: 90 tablet, Refills: 0      CONTINUE these medications which have NOT CHANGED   Details  carvedilol (COREG) 12.5 MG tablet Take 1 tablet (12.5 mg total) by mouth 2 (two) times daily with a meal. Qty: 60 tablet, Refills: 0    diphenhydrAMINE (BENADRYL) 25 MG tablet Take 25 mg by mouth every 6 (six) hours as needed for itching or allergies.    furosemide (LASIX) 20 MG tablet Take 1 tablet (20 mg total) by mouth daily. Qty: 30 tablet, Refills: 0    oxycodone (ROXICODONE) 30 MG immediate release tablet Take 30 mg by mouth every 4 (four) hours as needed for pain.    predniSONE (DELTASONE) 20 MG tablet Take 20 mg by mouth daily with breakfast.    QUEtiapine (SEROQUEL) 50 MG tablet Take 50 mg by mouth at bedtime.    apixaban (ELIQUIS) 5 MG TABS tablet Take 1 tablet (5 mg total) by mouth 2 (two) times daily. Qty: 60 tablet, Refills: 1    oxyCODONE-acetaminophen (PERCOCET/ROXICET) 5-325 MG per tablet Take 1 tablet by mouth every 4 (four) hours as needed for severe pain. Qty: 6 tablet, Refills: 0      STOP taking these medications     isosorbide-hydrALAZINE (BIDIL) 20-37.5 MG per tablet      ciprofloxacin (CIPRO) 500 MG tablet      nitrofurantoin, macrocrystal-monohydrate, (MACROBID) 100 MG capsule        Allergies  Allergen Reactions  . Amoxicillin Rash  . Ciprofloxacin Itching and Rash  . Sulfa Antibiotics Rash      The results of significant diagnostics from this hospitalization (including imaging, microbiology, ancillary and laboratory) are listed below for reference.    Significant Diagnostic Studies: Ct Abdomen Pelvis Wo Contrast  06/12/2015   CLINICAL DATA:  Low abdominal pain with bloating and gas. Chronic kidney disease.  EXAM: CT ABDOMEN AND PELVIS WITHOUT CONTRAST  TECHNIQUE: Multidetector CT imaging of the abdomen and pelvis was performed following the standard protocol without IV contrast.   COMPARISON:  01/18/2015  FINDINGS: Lung bases: Moderate enlargement of the heart. Minimal amount of pleural fluid. Dependent lung base atelectasis. No pulmonary edema.  Liver, spleen, gallbladder, pancreas, adrenal glands:  Unremarkable.  Kidneys, ureters, bladder: No renal stones. No hydronephrosis. Mass suggested from the lower pole right kidney on the prior exam is no longer evident. No evidence of a renal mass. Ureters are normal course and in caliber. Bladder is unremarkable.  Uterus and adnexa:  Unremarkable.  Lymph nodes:  No adenopathy.  Ascites:  None.  Gastrointestinal: There are scattered colonic diverticula. No diverticulitis. Colon otherwise unremarkable. Small bowel. Normal appendix.  Musculoskeletal: Mild compression fracture of T12. Schmorl's node along the upper endplate of L2. No osteoblastic or osteolytic lesions. Bony findings are stable.  IMPRESSION: 1. No acute findings. 2. No evidence of bowel  obstruction or ileus. 3. No renal or ureteral stones.  No hydronephrosis.   Electronically Signed   By: Amie Portland M.D.   On: 06/12/2015 18:50   Dg Chest 2 View  06/12/2015   CLINICAL DATA:  Shortness of breath, right chest pain  EXAM: CHEST  2 VIEW  COMPARISON:  05/23/2015  FINDINGS: Cardiomegaly is noted. Dual lead cardiac pacemaker is unchanged in position. No acute infiltrate or pulmonary edema. Mild thickening of right minor fissure.  IMPRESSION: No active cardiopulmonary disease.  Cardiomegaly.   Electronically Signed   By: Natasha Mead M.D.   On: 06/12/2015 16:31   Nm Pulmonary Perf And Vent  05/23/2015   CLINICAL DATA:  Acute respiratory distress. One week history of chest pain and cough.  EXAM: NUCLEAR MEDICINE VENTILATION - PERFUSION LUNG SCAN  TECHNIQUE: Ventilation images were obtained in multiple projections using inhaled aerosol Tc-40m DTPA. Perfusion images were obtained in multiple projections after intravenous injection of Tc-32m MAA.  RADIOPHARMACEUTICALS:  42.7 Technetium-3m  DTPA aerosol inhalation and 6.3 Technetium-70m MAA IV  COMPARISON:  Chest x-ray 04/26/2015  FINDINGS: Ventilation: Some central clumping of the radiopharmaceutical. Subsegmental basilar defects likely due to atelectasis.  Perfusion: No wedge shaped peripheral perfusion defects to suggest acute pulmonary embolism. Left-sided photopenic area at due to pacemaker.  IMPRESSION: Negative V/Q scan for pulmonary embolism.   Electronically Signed   By: Rudie Meyer M.D.   On: 05/23/2015 18:58   Dg Chest Port 1 View  05/23/2015   CLINICAL DATA:  Right flank pain and shortness of breath  EXAM: PORTABLE CHEST - 1 VIEW  COMPARISON:  04/28/2015  FINDINGS: Dual-chamber ICD/pacer leads from the left are in unchanged position.  Chronic cardiomegaly with negative aortic and hilar contours.  Limited low volume evaluation of the lungs. There is no edema, consolidation, effusion, or pneumothorax. No osseous findings to explain pain.  IMPRESSION: 1. No active disease. 2. Chronic cardiomegaly.   Electronically Signed   By: Marnee Spring M.D.   On: 05/23/2015 15:10    Microbiology: Recent Results (from the past 240 hour(s))  MRSA PCR Screening     Status: None   Collection Time: 06/12/15 10:43 PM  Result Value Ref Range Status   MRSA by PCR NEGATIVE NEGATIVE Final    Comment:        The GeneXpert MRSA Assay (FDA approved for NASAL specimens only), is one component of a comprehensive MRSA colonization surveillance program. It is not intended to diagnose MRSA infection nor to guide or monitor treatment for MRSA infections.      Labs: Basic Metabolic Panel:  Recent Labs Lab 06/12/15 1456 06/13/15 0850  NA 139 143  K 4.6 4.9  CL 110 115*  CO2 17* 16*  GLUCOSE 122* 83  BUN 69* 73*  CREATININE 4.79* 4.69*  CALCIUM 7.9* 8.3*   Liver Function Tests:  Recent Labs Lab 06/12/15 1456 06/13/15 0850  AST 17 13*  ALT 11* 9*  ALKPHOS 65 50  BILITOT 0.4 0.3  PROT 6.0* 5.3*  ALBUMIN 2.6* 2.3*   No  results for input(s): LIPASE, AMYLASE in the last 168 hours. No results for input(s): AMMONIA in the last 168 hours. CBC:  Recent Labs Lab 06/12/15 1456 06/13/15 0850 06/14/15 0100  WBC 10.3 9.4 8.1  NEUTROABS 8.5* 5.8  --   HGB 7.8* 7.0* 7.3*  HCT 25.4* 22.6* 24.1*  MCV 91.0 90.8 91.3  PLT 523* 445* 481*   Cardiac Enzymes:  Recent Labs Lab 06/12/15 2045  06/13/15 0240 06/13/15 0850  TROPONINI 0.03 0.04* <0.03   BNP: BNP (last 3 results)  Recent Labs  04/27/15 1109 05/23/15 1450 06/12/15 1456  BNP 3778.7* 749.0* 915.5*    ProBNP (last 3 results) No results for input(s): PROBNP in the last 8760 hours.  CBG: No results for input(s): GLUCAP in the last 168 hours.     Signed:  [name]  [facility] 06/14/2015, 10:59 AM

## 2015-06-15 LAB — URINE CULTURE: Culture: >=100000

## 2015-06-16 ENCOUNTER — Inpatient Hospital Stay (HOSPITAL_COMMUNITY)
Admission: EM | Admit: 2015-06-16 | Discharge: 2015-06-21 | DRG: 291 | Disposition: A | Discharge status: Home / Self Care | Payer: Medicaid Other | Attending: Internal Medicine | Admitting: Internal Medicine

## 2015-06-16 ENCOUNTER — Inpatient Hospital Stay (HOSPITAL_COMMUNITY): Payer: Medicaid Other

## 2015-06-16 ENCOUNTER — Emergency Department (HOSPITAL_COMMUNITY): Payer: Medicaid Other

## 2015-06-16 ENCOUNTER — Encounter (HOSPITAL_COMMUNITY): Payer: Self-pay | Admitting: Emergency Medicine

## 2015-06-16 DIAGNOSIS — Z79899 Other long term (current) drug therapy: Secondary | ICD-10-CM

## 2015-06-16 DIAGNOSIS — M329 Systemic lupus erythematosus, unspecified: Secondary | ICD-10-CM | POA: Diagnosis present

## 2015-06-16 DIAGNOSIS — N189 Chronic kidney disease, unspecified: Secondary | ICD-10-CM

## 2015-06-16 DIAGNOSIS — I2699 Other pulmonary embolism without acute cor pulmonale: Secondary | ICD-10-CM | POA: Diagnosis present

## 2015-06-16 DIAGNOSIS — M1990 Unspecified osteoarthritis, unspecified site: Secondary | ICD-10-CM | POA: Diagnosis present

## 2015-06-16 DIAGNOSIS — Z86718 Personal history of other venous thrombosis and embolism: Secondary | ICD-10-CM

## 2015-06-16 DIAGNOSIS — R0489 Hemorrhage from other sites in respiratory passages: Secondary | ICD-10-CM | POA: Diagnosis present

## 2015-06-16 DIAGNOSIS — Z882 Allergy status to sulfonamides status: Secondary | ICD-10-CM | POA: Diagnosis not present

## 2015-06-16 DIAGNOSIS — M3214 Glomerular disease in systemic lupus erythematosus: Secondary | ICD-10-CM | POA: Diagnosis present

## 2015-06-16 DIAGNOSIS — A419 Sepsis, unspecified organism: Secondary | ICD-10-CM

## 2015-06-16 DIAGNOSIS — J189 Pneumonia, unspecified organism: Secondary | ICD-10-CM | POA: Diagnosis present

## 2015-06-16 DIAGNOSIS — R0902 Hypoxemia: Secondary | ICD-10-CM | POA: Diagnosis not present

## 2015-06-16 DIAGNOSIS — I272 Other secondary pulmonary hypertension: Secondary | ICD-10-CM | POA: Diagnosis present

## 2015-06-16 DIAGNOSIS — I82409 Acute embolism and thrombosis of unspecified deep veins of unspecified lower extremity: Secondary | ICD-10-CM | POA: Diagnosis present

## 2015-06-16 DIAGNOSIS — N184 Chronic kidney disease, stage 4 (severe): Secondary | ICD-10-CM | POA: Diagnosis present

## 2015-06-16 DIAGNOSIS — D631 Anemia in chronic kidney disease: Secondary | ICD-10-CM | POA: Diagnosis present

## 2015-06-16 DIAGNOSIS — Z7901 Long term (current) use of anticoagulants: Secondary | ICD-10-CM | POA: Diagnosis not present

## 2015-06-16 DIAGNOSIS — I420 Dilated cardiomyopathy: Secondary | ICD-10-CM | POA: Diagnosis present

## 2015-06-16 DIAGNOSIS — Z880 Allergy status to penicillin: Secondary | ICD-10-CM | POA: Diagnosis not present

## 2015-06-16 DIAGNOSIS — E039 Hypothyroidism, unspecified: Secondary | ICD-10-CM | POA: Diagnosis present

## 2015-06-16 DIAGNOSIS — M351 Other overlap syndromes: Secondary | ICD-10-CM | POA: Diagnosis present

## 2015-06-16 DIAGNOSIS — E875 Hyperkalemia: Secondary | ICD-10-CM | POA: Diagnosis present

## 2015-06-16 DIAGNOSIS — Z7952 Long term (current) use of systemic steroids: Secondary | ICD-10-CM

## 2015-06-16 DIAGNOSIS — N179 Acute kidney failure, unspecified: Secondary | ICD-10-CM | POA: Diagnosis present

## 2015-06-16 DIAGNOSIS — E1122 Type 2 diabetes mellitus with diabetic chronic kidney disease: Secondary | ICD-10-CM | POA: Diagnosis present

## 2015-06-16 DIAGNOSIS — J9601 Acute respiratory failure with hypoxia: Secondary | ICD-10-CM | POA: Diagnosis present

## 2015-06-16 DIAGNOSIS — M4854XA Collapsed vertebra, not elsewhere classified, thoracic region, initial encounter for fracture: Secondary | ICD-10-CM | POA: Diagnosis present

## 2015-06-16 DIAGNOSIS — R918 Other nonspecific abnormal finding of lung field: Secondary | ICD-10-CM

## 2015-06-16 DIAGNOSIS — E872 Acidosis: Secondary | ICD-10-CM | POA: Diagnosis present

## 2015-06-16 DIAGNOSIS — D649 Anemia, unspecified: Secondary | ICD-10-CM | POA: Diagnosis present

## 2015-06-16 DIAGNOSIS — R0602 Shortness of breath: Secondary | ICD-10-CM | POA: Diagnosis present

## 2015-06-16 DIAGNOSIS — R042 Hemoptysis: Secondary | ICD-10-CM | POA: Diagnosis present

## 2015-06-16 DIAGNOSIS — T380X5A Adverse effect of glucocorticoids and synthetic analogues, initial encounter: Secondary | ICD-10-CM | POA: Diagnosis present

## 2015-06-16 DIAGNOSIS — Z86711 Personal history of pulmonary embolism: Secondary | ICD-10-CM | POA: Diagnosis present

## 2015-06-16 DIAGNOSIS — Y95 Nosocomial condition: Secondary | ICD-10-CM | POA: Diagnosis present

## 2015-06-16 DIAGNOSIS — I129 Hypertensive chronic kidney disease with stage 1 through stage 4 chronic kidney disease, or unspecified chronic kidney disease: Secondary | ICD-10-CM | POA: Diagnosis present

## 2015-06-16 DIAGNOSIS — I509 Heart failure, unspecified: Secondary | ICD-10-CM | POA: Diagnosis not present

## 2015-06-16 DIAGNOSIS — I5043 Acute on chronic combined systolic (congestive) and diastolic (congestive) heart failure: Principal | ICD-10-CM | POA: Diagnosis present

## 2015-06-16 DIAGNOSIS — N185 Chronic kidney disease, stage 5: Secondary | ICD-10-CM

## 2015-06-16 DIAGNOSIS — IMO0001 Reserved for inherently not codable concepts without codable children: Secondary | ICD-10-CM | POA: Diagnosis present

## 2015-06-16 LAB — URINALYSIS, ROUTINE W REFLEX MICROSCOPIC
Bilirubin Urine: NEGATIVE
GLUCOSE, UA: NEGATIVE mg/dL
HGB URINE DIPSTICK: NEGATIVE
Ketones, ur: NEGATIVE mg/dL
Leukocytes, UA: NEGATIVE
Nitrite: NEGATIVE
PH: 6 (ref 5.0–8.0)
PROTEIN: 100 mg/dL — AB
Specific Gravity, Urine: 1.01 (ref 1.005–1.030)
Urobilinogen, UA: 0.2 mg/dL (ref 0.0–1.0)

## 2015-06-16 LAB — I-STAT VENOUS BLOOD GAS, ED
ACID-BASE DEFICIT: 8 mmol/L — AB (ref 0.0–2.0)
BICARBONATE: 17.9 meq/L — AB (ref 20.0–24.0)
O2 Saturation: 13 %
PH VEN: 7.296 (ref 7.250–7.300)
TCO2: 19 mmol/L (ref 0–100)
pCO2, Ven: 36.8 mmHg — ABNORMAL LOW (ref 45.0–50.0)
pO2, Ven: 13 mmHg — CL (ref 30.0–45.0)

## 2015-06-16 LAB — STREP PNEUMONIAE URINARY ANTIGEN: STREP PNEUMO URINARY ANTIGEN: NEGATIVE

## 2015-06-16 LAB — CBC WITH DIFFERENTIAL/PLATELET
Basophils Absolute: 0 10*3/uL (ref 0.0–0.1)
Basophils Relative: 0 %
Eosinophils Absolute: 0 10*3/uL (ref 0.0–0.7)
Eosinophils Relative: 0 %
HEMATOCRIT: 26.8 % — AB (ref 36.0–46.0)
Hemoglobin: 8.4 g/dL — ABNORMAL LOW (ref 12.0–15.0)
LYMPHS ABS: 5.1 10*3/uL — AB (ref 0.7–4.0)
LYMPHS PCT: 35 %
MCH: 28.8 pg (ref 26.0–34.0)
MCHC: 31.3 g/dL (ref 30.0–36.0)
MCV: 91.8 fL (ref 78.0–100.0)
MONO ABS: 0.4 10*3/uL (ref 0.1–1.0)
MONOS PCT: 3 %
NEUTROS ABS: 8.7 10*3/uL — AB (ref 1.7–7.7)
Neutrophils Relative %: 62 %
Platelets: 514 10*3/uL — ABNORMAL HIGH (ref 150–400)
RBC: 2.92 MIL/uL — ABNORMAL LOW (ref 3.87–5.11)
RDW: 16.9 % — ABNORMAL HIGH (ref 11.5–15.5)
WBC: 14.3 10*3/uL — ABNORMAL HIGH (ref 4.0–10.5)

## 2015-06-16 LAB — I-STAT CG4 LACTIC ACID, ED: LACTIC ACID, VENOUS: 1.01 mmol/L (ref 0.5–2.0)

## 2015-06-16 LAB — URINE MICROSCOPIC-ADD ON

## 2015-06-16 LAB — COMPREHENSIVE METABOLIC PANEL
ALT: 15 U/L (ref 14–54)
ANION GAP: 12 (ref 5–15)
AST: 27 U/L (ref 15–41)
Albumin: 2.8 g/dL — ABNORMAL LOW (ref 3.5–5.0)
Alkaline Phosphatase: 61 U/L (ref 38–126)
BILIRUBIN TOTAL: 0.5 mg/dL (ref 0.3–1.2)
BUN: 80 mg/dL — AB (ref 6–20)
CO2: 18 mmol/L — ABNORMAL LOW (ref 22–32)
Calcium: 8.5 mg/dL — ABNORMAL LOW (ref 8.9–10.3)
Chloride: 110 mmol/L (ref 101–111)
Creatinine, Ser: 5.01 mg/dL — ABNORMAL HIGH (ref 0.44–1.00)
GFR calc Af Amer: 12 mL/min — ABNORMAL LOW (ref >60)
GFR, EST NON AFRICAN AMERICAN: 11 mL/min — AB (ref >60)
Glucose, Bld: 85 mg/dL (ref 65–99)
POTASSIUM: 4.5 mmol/L (ref 3.5–5.1)
Sodium: 140 mmol/L (ref 135–145)
TOTAL PROTEIN: 6 g/dL — AB (ref 6.5–8.1)

## 2015-06-16 LAB — I-STAT TROPONIN, ED: Troponin i, poc: 0.03 ng/mL (ref 0.00–0.08)

## 2015-06-16 LAB — GLUCOSE, CAPILLARY: GLUCOSE-CAPILLARY: 248 mg/dL — AB (ref 65–99)

## 2015-06-16 LAB — PROTIME-INR
INR: 1.5 — ABNORMAL HIGH (ref 0.00–1.49)
Prothrombin Time: 18.2 seconds — ABNORMAL HIGH (ref 11.6–15.2)

## 2015-06-16 LAB — PROCALCITONIN: Procalcitonin: 2.43 ng/mL

## 2015-06-16 LAB — APTT: aPTT: 31 seconds (ref 24–37)

## 2015-06-16 LAB — BRAIN NATRIURETIC PEPTIDE: B NATRIURETIC PEPTIDE 5: 2425.6 pg/mL — AB (ref 0.0–100.0)

## 2015-06-16 MED ORDER — QUETIAPINE FUMARATE 50 MG PO TABS
50.0000 mg | ORAL_TABLET | Freq: Every day | ORAL | Status: DC
  Administered 2015-06-16 – 2015-06-20 (×5): 50 mg via ORAL
  Filled 2015-06-16 (×5): qty 1

## 2015-06-16 MED ORDER — AZTREONAM 2 G IJ SOLR
2.0000 g | Freq: Once | INTRAMUSCULAR | Status: AC
  Administered 2015-06-16: 2 g via INTRAVENOUS
  Filled 2015-06-16: qty 2

## 2015-06-16 MED ORDER — SODIUM CHLORIDE 0.9 % IV SOLN
250.0000 mL | INTRAVENOUS | Status: DC | PRN
  Administered 2015-06-17: 250 mL via INTRAVENOUS

## 2015-06-16 MED ORDER — LORAZEPAM 2 MG/ML IJ SOLN
0.5000 mg | Freq: Four times a day (QID) | INTRAMUSCULAR | Status: DC | PRN
  Administered 2015-06-16 – 2015-06-19 (×4): 0.5 mg via INTRAVENOUS
  Filled 2015-06-16 (×4): qty 1

## 2015-06-16 MED ORDER — VANCOMYCIN HCL 10 G IV SOLR
1500.0000 mg | Freq: Once | INTRAVENOUS | Status: AC
  Administered 2015-06-16: 1500 mg via INTRAVENOUS
  Filled 2015-06-16: qty 1500

## 2015-06-16 MED ORDER — SODIUM CHLORIDE 0.9 % IJ SOLN: 3.0000 mL | INTRAMUSCULAR | Status: DC | PRN

## 2015-06-16 MED ORDER — PROTHROMBIN COMPLEX CONC HUMAN 500 UNITS IV KIT
3829.0000 [IU] | PACK | Status: AC
  Administered 2015-06-16: 3829 [IU] via INTRAVENOUS
  Filled 2015-06-16: qty 153

## 2015-06-16 MED ORDER — IPRATROPIUM-ALBUTEROL 0.5-2.5 (3) MG/3ML IN SOLN
3.0000 mL | Freq: Once | RESPIRATORY_TRACT | Status: AC
  Administered 2015-06-16: 3 mL via RESPIRATORY_TRACT
  Filled 2015-06-16: qty 3

## 2015-06-16 MED ORDER — DEXTROSE 5 % IV SOLN
1.0000 g | INTRAVENOUS | Status: DC
  Administered 2015-06-16 – 2015-06-19 (×4): 1 g via INTRAVENOUS
  Filled 2015-06-16 (×5): qty 1

## 2015-06-16 MED ORDER — LORAZEPAM 2 MG/ML IJ SOLN
1.0000 mg | Freq: Once | INTRAMUSCULAR | Status: AC
  Administered 2015-06-16: 1 mg via INTRAVENOUS

## 2015-06-16 MED ORDER — LORAZEPAM 2 MG/ML IJ SOLN
0.5000 mg | Freq: Once | INTRAMUSCULAR | Status: AC
  Administered 2015-06-16: 0.5 mg via INTRAVENOUS
  Filled 2015-06-16: qty 1

## 2015-06-16 MED ORDER — HYDRALAZINE HCL 25 MG PO TABS
25.0000 mg | ORAL_TABLET | Freq: Three times a day (TID) | ORAL | Status: DC
  Administered 2015-06-16 – 2015-06-18 (×7): 25 mg via ORAL
  Filled 2015-06-16 (×7): qty 1

## 2015-06-16 MED ORDER — METHYLPREDNISOLONE SODIUM SUCC 125 MG IJ SOLR
125.0000 mg | Freq: Once | INTRAMUSCULAR | Status: AC
  Administered 2015-06-16: 125 mg via INTRAVENOUS
  Filled 2015-06-16: qty 2

## 2015-06-16 MED ORDER — VANCOMYCIN HCL IN DEXTROSE 750-5 MG/150ML-% IV SOLN
750.0000 mg | INTRAVENOUS | Status: DC
  Administered 2015-06-17 – 2015-06-19 (×3): 750 mg via INTRAVENOUS
  Filled 2015-06-16 (×4): qty 150

## 2015-06-16 MED ORDER — PROTHROMBIN COMPLEX CONC HUMAN 500 UNITS IV KIT
3876.0000 [IU] | PACK | Status: DC
  Filled 2015-06-16: qty 155

## 2015-06-16 MED ORDER — LORAZEPAM 2 MG/ML IJ SOLN
INTRAMUSCULAR | Status: AC
  Administered 2015-06-16: 1 mg via INTRAVENOUS
  Filled 2015-06-16: qty 1

## 2015-06-16 MED ORDER — ONDANSETRON HCL 4 MG/2ML IJ SOLN: 4.0000 mg | Freq: Four times a day (QID) | INTRAMUSCULAR | Status: DC | PRN

## 2015-06-16 MED ORDER — FUROSEMIDE 10 MG/ML IJ SOLN: 20.0000 mg | Freq: Two times a day (BID) | INTRAMUSCULAR | Status: DC

## 2015-06-16 MED ORDER — METHYLPREDNISOLONE SODIUM SUCC 125 MG IJ SOLR
60.0000 mg | Freq: Two times a day (BID) | INTRAMUSCULAR | Status: DC
  Administered 2015-06-16 – 2015-06-17 (×2): 60 mg via INTRAVENOUS
  Filled 2015-06-16 (×2): qty 2

## 2015-06-16 MED ORDER — DEXTROSE 5 % IV SOLN
1.0000 g | Freq: Three times a day (TID) | INTRAVENOUS | Status: DC
  Administered 2015-06-16 – 2015-06-17 (×4): 1 g via INTRAVENOUS
  Filled 2015-06-16 (×6): qty 1

## 2015-06-16 MED ORDER — HYDRALAZINE HCL 20 MG/ML IJ SOLN
10.0000 mg | Freq: Once | INTRAMUSCULAR | Status: AC
  Administered 2015-06-16: 10 mg via INTRAVENOUS
  Filled 2015-06-16: qty 1

## 2015-06-16 MED ORDER — FUROSEMIDE 10 MG/ML IJ SOLN
80.0000 mg | INTRAMUSCULAR | Status: AC
  Administered 2015-06-16: 80 mg via INTRAVENOUS
  Filled 2015-06-16: qty 8

## 2015-06-16 MED ORDER — OXYCODONE HCL 5 MG PO TABS
30.0000 mg | ORAL_TABLET | ORAL | Status: DC | PRN
  Administered 2015-06-16 – 2015-06-21 (×17): 30 mg via ORAL
  Filled 2015-06-16 (×17): qty 6

## 2015-06-16 MED ORDER — CARVEDILOL 12.5 MG PO TABS
12.5000 mg | ORAL_TABLET | Freq: Two times a day (BID) | ORAL | Status: DC
  Administered 2015-06-16 – 2015-06-19 (×6): 12.5 mg via ORAL
  Filled 2015-06-16 (×6): qty 1

## 2015-06-16 MED ORDER — FUROSEMIDE 10 MG/ML IJ SOLN
60.0000 mg | Freq: Two times a day (BID) | INTRAMUSCULAR | Status: DC
  Administered 2015-06-16 – 2015-06-21 (×10): 60 mg via INTRAVENOUS
  Filled 2015-06-16 (×10): qty 6

## 2015-06-16 MED ORDER — FUROSEMIDE 10 MG/ML IJ SOLN
40.0000 mg | Freq: Once | INTRAMUSCULAR | Status: AC
  Administered 2015-06-16: 40 mg via INTRAVENOUS
  Filled 2015-06-16: qty 4

## 2015-06-16 MED ORDER — ACETAMINOPHEN 325 MG PO TABS: 650.0000 mg | ORAL_TABLET | ORAL | Status: DC | PRN

## 2015-06-16 MED ORDER — SODIUM CHLORIDE 0.9 % IJ SOLN
3.0000 mL | Freq: Two times a day (BID) | INTRAMUSCULAR | Status: DC
  Administered 2015-06-16 – 2015-06-20 (×9): 3 mL via INTRAVENOUS

## 2015-06-16 MED ORDER — METOLAZONE 5 MG PO TABS
5.0000 mg | ORAL_TABLET | Freq: Every day | ORAL | Status: DC
  Administered 2015-06-16 – 2015-06-21 (×6): 5 mg via ORAL
  Filled 2015-06-16 (×8): qty 1

## 2015-06-16 MED ORDER — METOPROLOL TARTRATE 1 MG/ML IV SOLN
5.0000 mg | Freq: Once | INTRAVENOUS | Status: AC
  Administered 2015-06-16: 5 mg via INTRAVENOUS
  Filled 2015-06-16: qty 5

## 2015-06-16 MED ORDER — ALPRAZOLAM 0.25 MG PO TABS
0.2500 mg | ORAL_TABLET | Freq: Two times a day (BID) | ORAL | Status: DC | PRN
  Administered 2015-06-16 – 2015-06-20 (×5): 0.25 mg via ORAL
  Filled 2015-06-16 (×5): qty 1

## 2015-06-16 NOTE — ED Notes (Addendum)
Pt standing up, refusing additional IV at this time. Pt family reports that pt is agitated and dizzy. Dr. Clydene Pugh made aware and IV ativan ordered.   Phlebotomy unable to draw blood cultures at this time due to pt actively vomiting blood. Pt alert and oriented, oxygen saturation 99-100% on room air. Pt offered o2 for comfort at this time, pt refused.

## 2015-06-16 NOTE — Progress Notes (Signed)
ANTIBIOTIC CONSULT NOTE - Follow Up  Pharmacy Consult for aztreonam + ceftazidime + vancomcyin  Indication: rule out pneumonia  Allergies  Allergen Reactions  . Amoxicillin Rash  . Ciprofloxacin Itching and Rash  . Sulfa Antibiotics Rash    Patient Measurements: Height:  (149.9 cm) Weight: 162 lb 8 oz (73.71 kg) IBW/kg (Calculated) : 43.2 Adjusted Body Weight:   Vital Signs: BP: 138/46 mmHg (09/22 1200) Pulse Rate: 44 (09/22 1209) Intake/Output from previous day:   Intake/Output from this shift: Total I/O In: 50 [I.V.:50] Out: 500 [Urine:500]  Labs:  Recent Labs  06/14/15 0100 06/16/15 0850  WBC 8.1 14.3*  HGB 7.3* 8.4*  PLT 481* 514*  CREATININE  --  5.01*   Estimated Creatinine Clearance: 14.4 mL/min (by C-G formula based on Cr of 5.01). No results for input(s): VANCOTROUGH, VANCOPEAK, VANCORANDOM, GENTTROUGH, GENTPEAK, GENTRANDOM, TOBRATROUGH, TOBRAPEAK, TOBRARND, AMIKACINPEAK, AMIKACINTROU, AMIKACIN in the last 72 hours.   Microbiology: Recent Results (from the past 720 hour(s))  Urine culture     Status: None   Collection Time: 06/12/15  4:49 PM  Result Value Ref Range Status   Specimen Description URINE, CLEAN CATCH  Final   Special Requests NONE  Final   Culture >=100,000 COLONIES/mL ESCHERICHIA COLI  Final   Report Status 06/15/2015 FINAL  Final   Organism ID, Bacteria ESCHERICHIA COLI  Final      Susceptibility   Escherichia coli - MIC*    AMPICILLIN >=32 RESISTANT Resistant     CEFAZOLIN <=4 SENSITIVE Sensitive     CEFTRIAXONE <=1 SENSITIVE Sensitive     CIPROFLOXACIN <=0.25 SENSITIVE Sensitive     GENTAMICIN <=1 SENSITIVE Sensitive     IMIPENEM <=0.25 SENSITIVE Sensitive     NITROFURANTOIN <=16 SENSITIVE Sensitive     TRIMETH/SULFA <=20 SENSITIVE Sensitive     AMPICILLIN/SULBACTAM 8 SENSITIVE Sensitive     PIP/TAZO <=4 SENSITIVE Sensitive     * >=100,000 COLONIES/mL ESCHERICHIA COLI  MRSA PCR Screening     Status: None   Collection  Time: 06/12/15 10:43 PM  Result Value Ref Range Status   MRSA by PCR NEGATIVE NEGATIVE Final    Comment:        The GeneXpert MRSA Assay (FDA approved for NASAL specimens only), is one component of a comprehensive MRSA colonization surveillance program. It is not intended to diagnose MRSA infection nor to guide or monitor treatment for MRSA infections.     Medical History: Past Medical History  Diagnosis Date  . Lupus   . Arthritis   . Thyroid disease   . Diabetes mellitus without complication   . PE (pulmonary embolism)   . DVT (deep venous thrombosis)     Medications:  Anti-infectives    Start     Dose/Rate Route Frequency Ordered Stop   06/16/15 1000  vancomycin (VANCOCIN) 1,500 mg in sodium chloride 0.9 % 500 mL IVPB     1,500 mg 250 mL/hr over 120 Minutes Intravenous  Once 06/16/15 0949 06/16/15 1244   06/16/15 1000  aztreonam (AZACTAM) 2 g in dextrose 5 % 50 mL IVPB     2 g 100 mL/hr over 30 Minutes Intravenous  Once 06/16/15 0951 06/16/15 1115     Assessment: 28 yof presented to the ED with SOB, Cxr shows worsening pna, bedside ECHO neg for RV strain - as hypercoagulable state with recent DVT / PE.  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 Apixaban was reversed with  Kcentra H/H today low stable 8/26 and pltc 500.  Bleeding resolved  She was started on empiric aztreonam/vancomycin for possible HCAP. Temperature not yet documented and WBC is elevated at 14.3  Scr is elevated above baseline at 5.01 with hx CKD BL 3.5    Vanc 1500 x 1 9/22> Aztreo 9/22>> Ceftazidime 9/22>  Goal of Therapy:  Eradication of infection VT 15-20  Plan:  - Aztreonam 2gm IV x 1 then 1gm IV Q8H Add ceftazidime 1gm q24 Continue vancomycin  q24 - F/u renal fxn, C&S, clinical status  Leota Sauers Pharm.D. CPP, BCPS Clinical Pharmacist [phone] 06/16/2015 1:15 PM

## 2015-06-16 NOTE — ED Notes (Signed)
Pt from home for sob, states was seen on Sunday and dx with URI, pt states recently seen for same and intubated due to PE. Pt  States woke up this morning with worsening breathing, EMS noted upon arrival pt with use of accessory muscles and labored breathing. Total of  of albuterol and .5 atrovent given en route by EMS. Pt arrives with labored breathing, pulse ox 100% on Room air. MD at bedside.

## 2015-06-16 NOTE — Progress Notes (Signed)
ANTIBIOTIC CONSULT NOTE - INITIAL  Pharmacy Consult for aztreonam Indication: rule out pneumonia  Allergies  Allergen Reactions  . Amoxicillin Rash  . Ciprofloxacin Itching and Rash  . Sulfa Antibiotics Rash    Patient Measurements: Weight: 162 lb 8 oz (73.71 kg) Adjusted Body Weight:   Vital Signs:   Intake/Output from previous day:   Intake/Output from this shift:    Labs:  Recent Labs  06/14/15 0100 06/16/15 0850  WBC 8.1 14.3*  HGB 7.3* 8.4*  PLT 481* 514*  CREATININE  --  5.01*   Estimated Creatinine Clearance: 14.4 mL/min (by C-G formula based on Cr of 5.01). No results for input(s): VANCOTROUGH, VANCOPEAK, VANCORANDOM, GENTTROUGH, GENTPEAK, GENTRANDOM, TOBRATROUGH, TOBRAPEAK, TOBRARND, AMIKACINPEAK, AMIKACINTROU, AMIKACIN in the last 72 hours.   Microbiology: Recent Results (from the past 720 hour(s))  Urine culture     Status: None   Collection Time: 06/12/15  4:49 PM  Result Value Ref Range Status   Specimen Description URINE, CLEAN CATCH  Final   Special Requests NONE  Final   Culture >=100,000 COLONIES/mL ESCHERICHIA COLI  Final   Report Status 06/15/2015 FINAL  Final   Organism ID, Bacteria ESCHERICHIA COLI  Final      Susceptibility   Escherichia coli - MIC*    AMPICILLIN >=32 RESISTANT Resistant     CEFAZOLIN <=4 SENSITIVE Sensitive     CEFTRIAXONE <=1 SENSITIVE Sensitive     CIPROFLOXACIN <=0.25 SENSITIVE Sensitive     GENTAMICIN <=1 SENSITIVE Sensitive     IMIPENEM <=0.25 SENSITIVE Sensitive     NITROFURANTOIN <=16 SENSITIVE Sensitive     TRIMETH/SULFA <=20 SENSITIVE Sensitive     AMPICILLIN/SULBACTAM 8 SENSITIVE Sensitive     PIP/TAZO <=4 SENSITIVE Sensitive     * >=100,000 COLONIES/mL ESCHERICHIA COLI  MRSA PCR Screening     Status: None   Collection Time: 06/12/15 10:43 PM  Result Value Ref Range Status   MRSA by PCR NEGATIVE NEGATIVE Final    Comment:        The GeneXpert MRSA Assay (FDA approved for NASAL specimens only), is one  component of a comprehensive MRSA colonization surveillance program. It is not intended to diagnose MRSA infection nor to guide or monitor treatment for MRSA infections.     Medical History: Past Medical History  Diagnosis Date  . Lupus   . Arthritis   . Thyroid disease   . Diabetes mellitus without complication   . PE (pulmonary embolism)   . 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 Apixaban was reversed and also starting empiric aztreonam for possible pneumonia. Temperature not yet documented and WBC is elevated at 14.3 Scr is elevated above baseline at 5.01 but known history of CKD.    Vanc x 1 9/22 Aztreo 9/22>>  Goal of Therapy:  Eradication of infection  Plan:  - Aztreonam 2gm IV x 1 then 1gm IV Q8H -  F/u renal fxn, C&S, clinical status - Continue vanc?  Rumbarger, Drake Leach 06/16/2015,9:53 AM

## 2015-06-16 NOTE — ED Notes (Signed)
Pt reports feeling dizzy, standing up in room, refusing to sit down. Advised that it is not safe for her to stand due to risk of falling. Pt refusing to sit in bed.

## 2015-06-16 NOTE — ED Provider Notes (Signed)
CSN: [id number]     Arrival date & time 06/16/15  4098 History   First MD Initiated Contact with Patient 06/16/15 [phone]     Chief Complaint  Patient presents with  . Shortness of Breath     (Consider location/radiation/quality/duration/timing/severity/associated sxs/prior Treatment) Patient is a 29 y.o. female presenting with shortness of breath. The history is provided by the patient and the EMS personnel.  Shortness of Breath Severity:  Moderate Onset quality:  Sudden Duration:  3 hours Timing:  Constant Progression:  Worsening Chronicity:  New Context comment:  Woke her from sleep Relieved by: breathing treatments PTA. Associated symptoms: cough (starting this morning) and wheezing   Associated symptoms: no fever, no sputum production and no vomiting   Risk factors: hx of PE/DVT and obesity   Risk factors comment:  SLE, CHF, chronic steroids, recent hospitalization   Past Medical History  Diagnosis Date  . Lupus   . Arthritis   . Thyroid disease   . Diabetes mellitus without complication   . PE (pulmonary embolism)   . DVT (deep venous thrombosis)    Past Surgical History  Procedure Laterality Date  . Pacemaker insertion    . Cesarean section     No family history on file. Social History  Substance Use Topics  . Smoking status: Never Smoker   . Smokeless tobacco: None  . Alcohol Use: No   OB History    No data available     Review of Systems  Constitutional: Negative for fever.  Respiratory: Positive for cough (starting this morning), shortness of breath and wheezing. Negative for sputum production.   Gastrointestinal: Negative for vomiting.  All other systems reviewed and are negative.     Allergies  Amoxicillin; Ciprofloxacin; and Sulfa antibiotics  Home Medications   Prior to Admission medications   Medication Sig Start Date End Date Taking? Authorizing Provider  apixaban (ELIQUIS) 5 MG TABS tablet Take 1 tablet (5 mg total) by mouth 2 (two)  times daily. 05/08/15  Yes Leroy Sea, MD  carvedilol (COREG) 12.5 MG tablet Take 1 tablet (12.5 mg total) by mouth 2 (two) times daily with a meal. 05/02/15  Yes Leroy Sea, MD  furosemide (LASIX) 20 MG tablet Take 1 tablet (20 mg total) by mouth daily. 05/02/15  Yes Leroy Sea, MD  hydrALAZINE (APRESOLINE) 25 MG tablet Take 1 tablet (25 mg total) by mouth every 8 (eight) hours. 06/14/15  Yes [name], MD  QUEtiapine (SEROQUEL) 50 MG tablet Take 50 mg by mouth at bedtime.   Yes Historical Provider, MD  diphenhydrAMINE (BENADRYL) 25 MG tablet Take 25 mg by mouth every 6 (six) hours as needed for itching or allergies.    Historical Provider, MD  oxycodone (ROXICODONE) 30 MG immediate release tablet Take 30 mg by mouth every 4 (four) hours as needed for pain.    Historical Provider, MD  oxyCODONE-acetaminophen (PERCOCET/ROXICET) 5-325 MG per tablet Take 1 tablet by mouth every 4 (four) hours as needed for severe pain. Patient not taking: Reported on 06/16/2015 05/19/15   Eyvonne Mechanic, PA-C  predniSONE (DELTASONE) 20 MG tablet Take 20 mg by mouth daily with breakfast.    Historical Provider, MD   Wt 162 lb 8 oz (73.71 kg)  SpO2 98%  LMP 04/27/2015 (Approximate) Physical Exam  Constitutional: She is oriented to person, place, and time. She appears well-developed and well-nourished. No distress.  HENT:  Head: Normocephalic.  Eyes: Conjunctivae are normal.  Neck: Neck supple. No tracheal  deviation present.  Cardiovascular: Regular rhythm and normal heart sounds.  Tachycardia present.   Pulmonary/Chest: Effort normal. No accessory muscle usage. Tachypnea noted. She has wheezes (diffuse bilateral). She has rales in the right lower field and the left lower field.  Abdominal: Soft. She exhibits no distension.  Neurological: She is alert and oriented to person, place, and time. Coordination normal. GCS eye subscore is 4. GCS verbal subscore is 5. GCS motor subscore is 6.  Skin: Skin is  warm and dry.  Psychiatric: She has a normal mood and affect. Her speech is normal.    ED Course  Procedures (including critical care time) CRITICAL CARE Performed by: Lyndal Pulley Total critical care time: 85 Critical care time was exclusive of separately billable procedures and treating other patients. Critical care was necessary to treat or prevent imminent or life-threatening deterioration. Critical care was time spent personally by me on the following activities: development of treatment plan with patient and/or surrogate as well as nursing, discussions with consultants, evaluation of patient's response to treatment, examination of patient, obtaining history from patient or surrogate, ordering and performing treatments and interventions, ordering and review of laboratory studies, ordering and review of radiographic studies, pulse oximetry and re-evaluation of patient's condition.  Procedure note: Ultrasound Guided Peripheral IV Ultrasound guided peripheral 1.88 inch angiocath IV placement performed by me. Indications: Nursing unable to place IV. Details: The antecubital fossa and upper arm were evaluated with a multifrequency linear probe. Patent brachial veins were noted. 1 attempt was made to cannulate a vein under realtime US guidance with successful cannulation of the vein and catheter placement. There is return of non-pulsatile dark red blood. The patient tolerated the procedure well without complications. Images archived electronically.  CPT codes: 16109 and [phone]   Emergency Focused Ultrasound Exam Limited Ultrasound of the Heart and Pericardium  Performed and interpreted by Dr. Clydene Pugh Indication: shortness of breath, tachycardia Multiple views of the heart, pericardium, and IVC are obtained with a multi frequency probe.  Findings: decreased contractility, no anechoic fluid, no RV dilatation Interpretation: depressed ejection fraction, no pericardial effusion, no evidence of RV  strain Images archived electronically.  CPT Code: 09811  Labs Review Labs Reviewed  CBC WITH DIFFERENTIAL/PLATELET - Abnormal; Notable for the following:    WBC 14.3 (*)    RBC 2.92 (*)    Hemoglobin 8.4 (*)    HCT 26.8 (*)    RDW 16.9 (*)    Platelets 514 (*)    Neutro Abs 8.7 (*)    Lymphs Abs 5.1 (*)    All other components within normal limits  COMPREHENSIVE METABOLIC PANEL - Abnormal; Notable for the following:    CO2 18 (*)    BUN 80 (*)    Creatinine, Ser 5.01 (*)    Calcium 8.5 (*)    Total Protein 6.0 (*)    Albumin 2.8 (*)    GFR calc non Af Amer 11 (*)    GFR calc Af Amer 12 (*)    All other components within normal limits  BRAIN NATRIURETIC PEPTIDE - Abnormal; Notable for the following:    B Natriuretic Peptide 2425.6 (*)    All other components within normal limits  PROTIME-INR - Abnormal; Notable for the following:    Prothrombin Time 18.2 (*)    INR 1.50 (*)    All other components within normal limits  I-STAT VENOUS BLOOD GAS, ED - Abnormal; Notable for the following:    pCO2, Ven 36.8 (*)  pO2, Ven 13.0 (*)    Bicarbonate 17.9 (*)    Acid-base deficit 8.0 (*)    All other components within normal limits  CULTURE, BLOOD (ROUTINE X 2)  CULTURE, BLOOD (ROUTINE X 2)  URINE CULTURE  APTT  URINALYSIS, ROUTINE W REFLEX MICROSCOPIC (NOT AT [facility])  I-STAT TROPOININ, ED  I-STAT CG4 LACTIC ACID, ED    Imaging Review Dg Chest Portable 1 View  06/16/2015   CLINICAL DATA:  Shortness of breath.  EXAM: PORTABLE CHEST - 1 VIEW  COMPARISON:  June 12, 2015.  FINDINGS: Stable cardiomegaly. New bilateral lung opacities are noted, with right greater than left. This is concerning for pneumonia or possibly edema. No pneumothorax is noted. Possible minimal right pleural effusion is noted. Left-sided pacemaker is noted and unchanged in position. Bony thorax is unremarkable.  IMPRESSION: New bilateral lung opacities are noted, with right greater than left. This is  concerning for pneumonia or possibly edema. Minimal right pleural effusion is noted as well.   Electronically Signed   By: Lupita Raider, M.D.   On: 06/16/2015 09:19   I have personally reviewed and evaluated these images and lab results as part of my medical decision-making.   EKG Interpretation   Date/Time:  Thursday June 16 2015 08:44:55 EDT Ventricular Rate:  136 PR Interval:  97 QRS Duration: 80 QT Interval:  312 QTC Calculation: 469 R Axis:   15 Text Interpretation:  Sinus tachycardia Confirmed by KNOTT MD, Reuel Boom  ([id number]) on 06/16/2015 9:55:16 AM      MDM   Final diagnoses:  Pulmonary alveolar hemorrhage  HCAP (healthcare-associated pneumonia)  Sepsis, due to unspecified organism  History of pulmonary embolism  Acute respiratory failure with hypoxia  SLE (systemic lupus erythematosus)  CKD (chronic kidney disease), unspecified stage  Acute on chronic combined systolic and diastolic HF (heart failure), 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 She is recent admission to the hospital with ongoing shortness of breath and clear sputum that was attributed to urinary tract infection and treated with antibiotic therapy. The shortness of breath woke the patient from sleep this morning, she had sudden onset of cough, she was increasingly short of breath and tachycardic on arrival here with slight improvement on breathing treatment. Initial presentation concerning for CHF exacerbation versus acute pulmonary embolus. No RV strain noted on bedside US. Given stress dose steroids for evident acute illness. Shortly after arrival the patient began having bright red moderate volume hemoptysis concerning for diffuse alveolar hemorrhage as primary cause of shortness of breath with bilateral opacities evident on chest x-ray. Unable to perform contrasted scan due to chronic kidney  disease, will assume related to medication-induced bleeding and will reverse Eliquis with KCENTRA.  Patient continually tachycardic, has new leukocytosis, low-grade temperature, covered for healthcare associated pneumonia as well as expansion on urinary tract and infection coverage. With ongoing opacities in stable blood pressure, Lasix was initiated to help with diuresis as the patient's BNP is dramatically elevated from baseline consistent with prior CHF exacerbations.   D/t mixed picture and multiple active problems, Hospitalist was consulted for stepdown admission and will see the patient in the emergency department.    Lyndal Pulley, MD 06/16/15 1113

## 2015-06-16 NOTE — H&P (Addendum)
History and Physical  Paige Aguilar ZOX:[id number] DOB: 05/28/1986 DOA: 06/16/2015  Referring physician: Dr Paige Aguilar, ED physician PCP: Paige Cashing, MD   Chief Complaint: Shortness of breath  HPI: Paige Aguilar is a 29 y.o. female  With a history of lupus, DVT and presumed PE on Eliquis, diet-controlled diabetes, congestive heart failure with a ejection fraction of 25-30% with diffuse hypokinesis and grade 1 diastolic dysfunction on 05/01/2015. The patient was recently hospitalized 4 days ago with chest pain and green sputum production. She was discharged to days ago on Omnicef to complete her antibiotic regimen. Additionally, she will was recently diagnosed in the beginning of August with a DVT. The patient did have chest pain at that time and was presumed to have a PE, although a VQ scan was negative. The patient awoke this morning acutely short of breath, which was worse with laying down and activity and improved with sitting up and rest. Her breathing worsened while at home. EMS was called the patient was found to be restless and using accessory muscles with labored breathing. While in the emergency department, the patient did have several episodes of hemoptysis which totaled approximately 50 mL's.   Review of Systems:   Pt complains of malaise, cough with green sputum production, peripheral edema.  Pt denies any fevers, chills, nausea, vomiting, diarrhea, constipation, abdominal pain, wheezing, palpitations, headache, vision changes, lightheadedness, dizziness, melena, rectal bleeding.  Review of systems are otherwise negative  Past Medical History  Diagnosis Date  . Lupus   . Arthritis   . Thyroid disease   . Diabetes mellitus without complication   . PE (pulmonary embolism)   . DVT (deep venous thrombosis)    Past Surgical History  Procedure Laterality Date  . Pacemaker insertion    . Cesarean section     Social History:  reports that she has never smoked. She does  not have any smokeless tobacco history on file. She reports that she does not drink alcohol or use illicit drugs. Patient lives at home & is able to participate in activities of daily living  Allergies  Allergen Reactions  . Amoxicillin Rash  . Ciprofloxacin Itching and Rash  . Sulfa Antibiotics Rash    No family history of diabetes or heart disease.  Prior to Admission medications   Medication Sig Start Date End Date Taking? Authorizing Provider  apixaban (ELIQUIS) 5 MG TABS tablet Take 1 tablet (5 mg total) by mouth 2 (two) times daily. 05/08/15  Yes Leroy Sea, MD  carvedilol (COREG) 12.5 MG tablet Take 1 tablet (12.5 mg total) by mouth 2 (two) times daily with a meal. 05/02/15  Yes Leroy Sea, MD  furosemide (LASIX) 20 MG tablet Take 1 tablet (20 mg total) by mouth daily. 05/02/15  Yes Leroy Sea, MD  hydrALAZINE (APRESOLINE) 25 MG tablet Take 1 tablet (25 mg total) by mouth every 8 (eight) hours. 06/14/15  Yes [name], MD  QUEtiapine (SEROQUEL) 50 MG tablet Take 50 mg by mouth at bedtime.   Yes Historical Provider, MD    Physical Exam: BP 121/96 mmHg  Resp 18  Wt 73.71 kg (162 lb 8 oz)  SpO2 98%  LMP 04/27/2015 (Approximate)  General: Young black female. Awake and alert and oriented x3. She has labored breathing, although not tachypneic Eyes: Pupils equal, round, reactive to light. Extraocular muscles are intact. Sclerae anicteric and noninjected.  ENT:  Moist mucosal membranes. No mucosal lesions. Teeth in good repair  Neck: Neck supple without lymphadenopathy.  No carotid bruits. No masses palpated.  Cardiovascular: Tachycardic with normal S1-S2 sounds. No murmurs, rubs, gallops auscultated. JVD to proximally 7 cm.  Respiratory: Labored breathing with use of accessory muscles. Rales in bases bilaterally to mid lung fields. No wheezes or rhonchi  Abdomen: Soft, nontender, nondistended. Active bowel sounds. No masses or hepatosplenomegaly  Skin: Dry, warm to  touch. 2+ dorsalis pedis and radial pulses. 1+ edema in legs bilaterally Musculoskeletal: No calf or leg pain. All major joints not erythematous nontender.  Psychiatric: Intact judgment and insight.  Neurologic: No focal neurological deficits. Cranial nerves II through XII are grossly intact.           Labs on Admission:  Basic Metabolic Panel:  Recent Labs Lab 06/12/15 1456 06/13/15 0850 06/16/15 0850  NA 139 143 140  K 4.6 4.9 4.5  CL 110 115* 110  CO2 17* 16* 18*  GLUCOSE 122* 83 85  BUN 69* 73* 80*  CREATININE 4.79* 4.69* 5.01*  CALCIUM 7.9* 8.3* 8.5*   Liver Function Tests:  Recent Labs Lab 06/12/15 1456 06/13/15 0850 06/16/15 0850  AST 17 13* 27  ALT 11* 9* 15  ALKPHOS 65 50 61  BILITOT 0.4 0.3 0.5  PROT 6.0* 5.3* 6.0*  ALBUMIN 2.6* 2.3* 2.8*   No results for input(s): LIPASE, AMYLASE in the last 168 hours. No results for input(s): AMMONIA in the last 168 hours. CBC:  Recent Labs Lab 06/12/15 1456 06/13/15 0850 06/14/15 0100 06/16/15 0850  WBC 10.3 9.4 8.1 14.3*  NEUTROABS 8.5* 5.8  --  8.7*  HGB 7.8* 7.0* 7.3* 8.4*  HCT 25.4* 22.6* 24.1* 26.8*  MCV 91.0 90.8 91.3 91.8  PLT 523* 445* 481* 514*   Cardiac Enzymes:  Recent Labs Lab 06/12/15 2045 06/13/15 0240 06/13/15 0850  TROPONINI 0.03 0.04* <0.03    BNP (last 3 results)  Recent Labs  05/23/15 1450 06/12/15 1456 06/16/15 0850  BNP 749.0* 915.5* 2425.6*    ProBNP (last 3 results) No results for input(s): PROBNP in the last 8760 hours.  CBG: No results for input(s): GLUCAP in the last 168 hours.  Radiological Exams on Admission: Dg Chest Portable 1 View  06/16/2015   CLINICAL DATA:  Shortness of breath.  EXAM: PORTABLE CHEST - 1 VIEW  COMPARISON:  June 12, 2015.  FINDINGS: Stable cardiomegaly. New bilateral lung opacities are noted, with right greater than left. This is concerning for pneumonia or possibly edema. No pneumothorax is noted. Possible minimal right pleural  effusion is noted. Left-sided pacemaker is noted and unchanged in position. Bony thorax is unremarkable.  IMPRESSION: New bilateral lung opacities are noted, with right greater than left. This is concerning for pneumonia or possibly edema. Minimal right pleural effusion is noted as well.   Electronically Signed   By: Lupita Raider, M.D.   On: 06/16/2015 09:19    EKG: Independently reviewed. Sinus tachycardia with a ventricular rate of 136. Normal intervals. No acute ST elevation or depression.  Assessment/Plan Present on Admission:  . Acute respiratory failure with hypoxemia . Hemoptysis . Acute on chronic combined systolic and diastolic HF (heart failure), NYHA class 3 . Acute renal failure superimposed on stage 4 chronic kidney disease . DVT (deep venous thrombosis) . SLE (systemic lupus erythematosus)  This patient was discussed with the ED physician, including pertinent vitals, physical exam findings, labs, and imaging.  We also discussed care given by the ED provider.  #1 acute respiratory failure with hypoxemia  Improved on nasal cannula  Admit to stepdown  Continue oxygen therapy #2 hemoptysis  Question of the etiology - certainly Eliquis plays a part in the amount of hemoptysis that the patient had. There is a question of whether the patient has pneumonia on chest x-ray and the patient's laboratory data may correlate with this with her elevated white blood cell count of 14. The patient has been on a third-generation oral cephalosporins, which should treat most typical pathogens.  Acute heart failure could also contribute to the etiology of the patient's hemoptysis, particularly left heart failure.  Additionally, although rare, lupus can also cause hemoptysis.  The patient was given FEIBA in ED -  pharmacy to monitor PTT  The patient's hemoptysis has decreased while in the ED.   We'll cover patient for healthcare associated pathogens with ceftazidime, aztreonam, and  vancomycin  We'll obtain pro-calcitonin level  Repeat Legionella, strep antigens  Blood cultures obtained  Sputum culture  In hemoptysis returns - will consider pulmonary consult #3 acute on chronic combined systolic and diastolic heart failure  BNP significantly elevated from patient's baseline at 2400  Strict I's and O's  Lasix 60 mg IV twice daily for diuresis  Patient not on ACE inhibitor or ARB due to renal failure  Echocardiogram #4 acute renal failure superimposed on stage IV chronic kidney disease  We'll gently diuresis  Consult pharmacy for dosing for antibiotics  Will be cautious in nephrotoxic drugs #5 DVT  Hold Eliquis secondary to bleeding  TED stockings #6 SLE  Patient not on steroids at this point  We'll start methylprednisone 60 mg IV twice daily #7 hypertension  Continue home medications  DVT prophylaxis: No pharmacological prophylaxis/treatment secondary to acute bleed - will provide TED stockings  Consultants: None  Code Status: Full code  Family Communication: Mother in the room    Disposition Plan: Admit to stepdown   Levie Heritage, DO [facility] Pager [phone]

## 2015-06-16 NOTE — Progress Notes (Signed)
Pt transferred from ED to 2H15 with 2L nasal cannula. Pt is exhibiting use of accessory muscles for breathing in tripod position. Respirations 40's. Tachycardic in 130/140's. Dr. Adrian Blackwater called who ordered BiPaP. Respiratory made aware.

## 2015-06-16 NOTE — Progress Notes (Signed)
Attempted ABG x 2, pt very nervous and keeps pulling arm away.  RN aware, MD aware.  PER MD, okay for RN to obtain VBG.

## 2015-06-17 ENCOUNTER — Inpatient Hospital Stay (HOSPITAL_COMMUNITY): Payer: Medicaid Other

## 2015-06-17 ENCOUNTER — Inpatient Hospital Stay (INDEPENDENT_AMBULATORY_CARE_PROVIDER_SITE_OTHER): Payer: Medicaid Other

## 2015-06-17 DIAGNOSIS — I272 Pulmonary hypertension, unspecified: Secondary | ICD-10-CM | POA: Diagnosis present

## 2015-06-17 DIAGNOSIS — R042 Hemoptysis: Secondary | ICD-10-CM

## 2015-06-17 DIAGNOSIS — J189 Pneumonia, unspecified organism: Secondary | ICD-10-CM | POA: Diagnosis present

## 2015-06-17 DIAGNOSIS — I509 Heart failure, unspecified: Secondary | ICD-10-CM

## 2015-06-17 DIAGNOSIS — J9601 Acute respiratory failure with hypoxia: Secondary | ICD-10-CM

## 2015-06-17 DIAGNOSIS — M329 Systemic lupus erythematosus, unspecified: Secondary | ICD-10-CM | POA: Diagnosis present

## 2015-06-17 DIAGNOSIS — I2699 Other pulmonary embolism without acute cor pulmonale: Secondary | ICD-10-CM

## 2015-06-17 DIAGNOSIS — M351 Other overlap syndromes: Secondary | ICD-10-CM | POA: Diagnosis present

## 2015-06-17 DIAGNOSIS — M358 Other specified systemic involvement of connective tissue: Secondary | ICD-10-CM

## 2015-06-17 DIAGNOSIS — E875 Hyperkalemia: Secondary | ICD-10-CM | POA: Diagnosis present

## 2015-06-17 DIAGNOSIS — R0902 Hypoxemia: Secondary | ICD-10-CM

## 2015-06-17 DIAGNOSIS — I5043 Acute on chronic combined systolic (congestive) and diastolic (congestive) heart failure: Principal | ICD-10-CM

## 2015-06-17 DIAGNOSIS — I270 Primary pulmonary hypertension: Secondary | ICD-10-CM

## 2015-06-17 LAB — COMPREHENSIVE METABOLIC PANEL
ALBUMIN: 2.7 g/dL — AB (ref 3.5–5.0)
ALK PHOS: 49 U/L (ref 38–126)
ALT: 15 U/L (ref 14–54)
ANION GAP: 13 (ref 5–15)
AST: 25 U/L (ref 15–41)
BILIRUBIN TOTAL: 0.5 mg/dL (ref 0.3–1.2)
BUN: 98 mg/dL — AB (ref 6–20)
CALCIUM: 8.8 mg/dL — AB (ref 8.9–10.3)
CO2: 17 mmol/L — AB (ref 22–32)
CREATININE: 5.31 mg/dL — AB (ref 0.44–1.00)
Chloride: 108 mmol/L (ref 101–111)
GFR calc Af Amer: 12 mL/min — ABNORMAL LOW (ref >60)
GFR calc non Af Amer: 10 mL/min — ABNORMAL LOW (ref >60)
GLUCOSE: 125 mg/dL — AB (ref 65–99)
Potassium: 5.7 mmol/L — ABNORMAL HIGH (ref 3.5–5.1)
SODIUM: 138 mmol/L (ref 135–145)
TOTAL PROTEIN: 6.5 g/dL (ref 6.5–8.1)

## 2015-06-17 LAB — BASIC METABOLIC PANEL
ANION GAP: 12 (ref 5–15)
BUN: 93 mg/dL — ABNORMAL HIGH (ref 6–20)
CALCIUM: 8.4 mg/dL — AB (ref 8.9–10.3)
CO2: 17 mmol/L — ABNORMAL LOW (ref 22–32)
Chloride: 109 mmol/L (ref 101–111)
Creatinine, Ser: 5.25 mg/dL — ABNORMAL HIGH (ref 0.44–1.00)
GFR, EST AFRICAN AMERICAN: 12 mL/min — AB (ref >60)
GFR, EST NON AFRICAN AMERICAN: 10 mL/min — AB (ref >60)
GLUCOSE: 165 mg/dL — AB (ref 65–99)
POTASSIUM: 6 mmol/L — AB (ref 3.5–5.1)
Sodium: 138 mmol/L (ref 135–145)

## 2015-06-17 LAB — CBC WITH DIFFERENTIAL/PLATELET
BASOS PCT: 0 %
Basophils Absolute: 0 10*3/uL (ref 0.0–0.1)
EOS ABS: 0 10*3/uL (ref 0.0–0.7)
Eosinophils Relative: 0 %
HEMATOCRIT: 24.3 % — AB (ref 36.0–46.0)
Hemoglobin: 7.8 g/dL — ABNORMAL LOW (ref 12.0–15.0)
LYMPHS ABS: 1.9 10*3/uL (ref 0.7–4.0)
LYMPHS PCT: 15 %
MCH: 29.1 pg (ref 26.0–34.0)
MCHC: 32.1 g/dL (ref 30.0–36.0)
MCV: 90.7 fL (ref 78.0–100.0)
Monocytes Absolute: 0.5 10*3/uL (ref 0.1–1.0)
Monocytes Relative: 4 %
NEUTROS ABS: 10.3 10*3/uL — AB (ref 1.7–7.7)
Neutrophils Relative %: 81 %
PLATELETS: 482 10*3/uL — AB (ref 150–400)
RBC: 2.68 MIL/uL — ABNORMAL LOW (ref 3.87–5.11)
RDW: 16.9 % — AB (ref 11.5–15.5)
WBC: 12.7 10*3/uL — ABNORMAL HIGH (ref 4.0–10.5)

## 2015-06-17 LAB — PROCALCITONIN: Procalcitonin: 7.59 ng/mL

## 2015-06-17 LAB — LEGIONELLA PNEUMOPHILA SEROGP 1 UR AG: L. PNEUMOPHILA SEROGP 1 UR AG: NEGATIVE

## 2015-06-17 LAB — URINE CULTURE

## 2015-06-17 LAB — CBC
HEMATOCRIT: 23.2 % — AB (ref 36.0–46.0)
HEMOGLOBIN: 7.1 g/dL — AB (ref 12.0–15.0)
MCH: 27.8 pg (ref 26.0–34.0)
MCHC: 30.6 g/dL (ref 30.0–36.0)
MCV: 91 fL (ref 78.0–100.0)
Platelets: 457 10*3/uL — ABNORMAL HIGH (ref 150–400)
RBC: 2.55 MIL/uL — AB (ref 3.87–5.11)
RDW: 16.9 % — ABNORMAL HIGH (ref 11.5–15.5)
WBC: 13.4 10*3/uL — AB (ref 4.0–10.5)

## 2015-06-17 LAB — LACTIC ACID, PLASMA: Lactic Acid, Venous: 1.8 mmol/L (ref 0.5–2.0)

## 2015-06-17 LAB — C-REACTIVE PROTEIN: CRP: 10.1 mg/dL — AB (ref <1.0)

## 2015-06-17 LAB — MAGNESIUM: Magnesium: 1.6 mg/dL — ABNORMAL LOW (ref 1.7–2.4)

## 2015-06-17 LAB — PREPARE RBC (CROSSMATCH)

## 2015-06-17 MED ORDER — NON FORMULARY: 10.0000 [IU] | Freq: Three times a day (TID) | Status: DC

## 2015-06-17 MED ORDER — ZOLPIDEM TARTRATE 5 MG PO TABS
5.0000 mg | ORAL_TABLET | Freq: Once | ORAL | Status: AC
  Administered 2015-06-17: 5 mg via ORAL
  Filled 2015-06-17: qty 1

## 2015-06-17 MED ORDER — SODIUM CHLORIDE 0.9 % IV SOLN: Freq: Once | INTRAVENOUS | Status: DC

## 2015-06-17 MED ORDER — NON FORMULARY: 40.0000 [IU] | Freq: Every day | Status: DC

## 2015-06-17 MED ORDER — METHYLPREDNISOLONE SODIUM SUCC 125 MG IJ SOLR
60.0000 mg | Freq: Four times a day (QID) | INTRAMUSCULAR | Status: DC
  Administered 2015-06-17 – 2015-06-18 (×3): 60 mg via INTRAVENOUS
  Filled 2015-06-17 (×3): qty 2

## 2015-06-17 MED ORDER — SODIUM POLYSTYRENE SULFONATE 15 GM/60ML PO SUSP
45.0000 g | Freq: Once | ORAL | Status: AC
  Administered 2015-06-17: 45 g via ORAL
  Filled 2015-06-17: qty 180

## 2015-06-17 MED ORDER — MAGNESIUM SULFATE 50 % IJ SOLN
3.0000 g | Freq: Once | INTRAVENOUS | Status: AC
  Administered 2015-06-17: 3 g via INTRAVENOUS
  Filled 2015-06-17: qty 6

## 2015-06-17 MED ORDER — CETYLPYRIDINIUM CHLORIDE 0.05 % MT LIQD
7.0000 mL | Freq: Two times a day (BID) | OROMUCOSAL | Status: DC
  Administered 2015-06-17 – 2015-06-21 (×8): 7 mL via OROMUCOSAL

## 2015-06-17 MED ORDER — SODIUM BICARBONATE 650 MG PO TABS
650.0000 mg | ORAL_TABLET | Freq: Three times a day (TID) | ORAL | Status: DC
  Administered 2015-06-17 – 2015-06-21 (×11): 650 mg via ORAL
  Filled 2015-06-17 (×13): qty 1

## 2015-06-17 NOTE — Progress Notes (Signed)
*  PRELIMINARY RESULTS* Echocardiogram2D Echocardiogram has been performed.  Janalyn Harder 06/17/2015, 8:47 AM

## 2015-06-17 NOTE — Care Management (Signed)
Transitional Care Clinic Care Coordination Note:  Admit date:  06/16/15 Discharge date: TBD Discharge Disposition: Home when stable. Patient contact: [phone] (mobile) Emergency contact(s): none  This Case Manager reviewed patient's EMR and determined patient would benefit from post-discharge medical management and chronic care management services through the [facility]. Patient has a history of lupus, DVT, diet-controlled diabetes, congestive heart failure. Admitted with acute respiratory failure with hypoxemia, hemoptysis.  Patient has had 4 inpatient admissions and 2 ED visits in the last six months. This Case Manager met with patient to discuss the services and medical management that can be provided at the [facility]. Patient verbalized understanding and agreed to receive post-discharge care at the [facility].   Patient scheduled for Transitional Care appointment on 06/22/15 at [facility] with Dr. Jarold Song.  Clinic information and appointment time provided to patient. Appointment information also placed on AVS.  Assessment:       Home Environment: Patient lives with her 18.101 year old son and a roommate at [facility]. Patient indicates she is in a transitional housing program and is working on obtaining more permanent housing. Patient indicates she moved to [place], [place] from [place] in August 2016.       Support System: Patient indicates she has a cousin that lives in the area that provides assistance as needed. Patient's cousin watching patient's son while she is hospitalized.       Level of functioning: Independent       Home DME: none       Home care services: none       Transportation: Private vehicle. Patient indicates she has her own vehicle and drives to her medical appointments.         Food/Nutrition: Patient indicates she gets $357 of food stamps/month. She indicates she has access to needed food.        Medications: Patient states  she typically gets her medications from [facility] on [address]. She indicates she occasionally has difficulty affording copays.  Patient has Medicaid [place].  Informed patient that [facility] and [employer] also has an Insurance account manager. Discussed available pharmacy resources at [facility]. Patient verbalized understanding. In addition, informed patient of the services provided by [facility]. Patient interested in learning more about [facility] so referral made to Stevan Born with [facility].        Identified Barriers: lack of permanent housing, difficulty affording medication copays        PCP: Dr. Ricke Hey             Arranged services:        Services communicated to: Luz Lex, RN CM

## 2015-06-17 NOTE — Consult Note (Signed)
Name: Paige Aguilar MRN: [id number] DOB: [birth year]    ADMISSION DATE:  06/16/2015 CONSULTATION DATE:  06/17/2015  REFERRING MD :  Dr. Joseph Art, [facility]  CHIEF COMPLAINT:  Hemoptysis and tachycardia  BRIEF PATIENT DESCRIPTION: 29 year old female with history of severe lupus chronically on steroids (prednisone 20 mg daily).  Presents to the hospital with hemoptysis.  She was admitted 9/18 then discharged 9/20 for SOB, required BiPAP at the time.  Was diuresed and discharged.  She also has history of stage 4 CKD.  Presents back on 9/22 with hemoptysis.  CXR with diffuse opacification noted.  PCCM consulted for [facility].  Hemoptysis described as frank blood with no clots or sputum mixed in.  Bright in color.  No fever/chills.  Fluctuating weight up and down but no specific weight loss.  Lives in a shelter currently.  SIGNIFICANT EVENTS  9/22 admission and hemoptysis  STUDIES:  9/22 CT of the chest for ?DAH.   HISTORY OF PRESENT ILLNESS:  29 year old female with history of severe lupus chronically on steroids (prednisone 20 mg daily).  Presents to the hospital with hemoptysis.  She was admitted 9/18 then discharged 9/20 for SOB, required BiPAP at the time.  Was diuresed and discharged.  She also has history of stage 4 CKD.  Presents back on 9/22 with hemoptysis.  CXR with diffuse opacification noted.  PCCM consulted for [facility].   PAST MEDICAL HISTORY :   has a past medical history of Lupus; Arthritis; Thyroid disease; Diabetes mellitus without complication; PE (pulmonary embolism); and DVT (deep venous thrombosis).  has past surgical history that includes Pacemaker insertion and Cesarean section. Prior to Admission medications   Medication Sig Start Date End Date Taking? Authorizing Provider  apixaban (ELIQUIS) 5 MG TABS tablet Take 1 tablet (5 mg total) by mouth 2 (two) times daily. 05/08/15  Yes Leroy Sea, MD  carvedilol (COREG) 12.5 MG tablet Take 1 tablet (12.5 mg total) by mouth 2 (two)  times daily with a meal. 05/02/15  Yes Leroy Sea, MD  furosemide (LASIX) 20 MG tablet Take 1 tablet (20 mg total) by mouth daily. 05/02/15  Yes Leroy Sea, MD  hydrALAZINE (APRESOLINE) 25 MG tablet Take 1 tablet (25 mg total) by mouth every 8 (eight) hours. 06/14/15  Yes [name], MD  QUEtiapine (SEROQUEL) 50 MG tablet Take 50 mg by mouth at bedtime.   Yes Historical Provider, MD   Allergies  Allergen Reactions  . Amoxicillin Rash  . Ciprofloxacin Itching and Rash  . Sulfa Antibiotics Rash    FAMILY HISTORY:  family history is not on file. SOCIAL HISTORY:  reports that she has never smoked. She does not have any smokeless tobacco history on file. She reports that she does not drink alcohol or use illicit drugs.  REVIEW OF SYSTEMS:   Constitutional: Negative for fever, chills, weight loss, malaise/fatigue and diaphoresis.  HENT: Negative for hearing loss, ear pain, nosebleeds, congestion, sore throat, neck pain, tinnitus and ear discharge.   Eyes: Negative for blurred vision, double vision, photophobia, pain, discharge and redness.  Respiratory: Negative for cough, hemoptysis, sputum production, shortness of breath, wheezing and stridor.   Cardiovascular: Negative for chest pain, palpitations, orthopnea, claudication, leg swelling and PND.  Gastrointestinal: Negative for heartburn, nausea, vomiting, abdominal pain, diarrhea, constipation, blood in stool and melena.  Genitourinary: Negative for dysuria, urgency, frequency, hematuria and flank pain.  Musculoskeletal: Negative for myalgias, back pain, joint pain and falls.  Skin: Negative for itching  and rash.  Neurological: Negative for dizziness, tingling, tremors, sensory change, speech change, focal weakness, seizures, loss of consciousness, weakness and headaches.  Endo/Heme/Allergies: Negative for environmental allergies and polydipsia. Does not bruise/bleed easily.  SUBJECTIVE:   VITAL SIGNS: Temp:  [97.3 F (36.3  C)-98.4 F (36.9 C)] 98.4 F (36.9 C) (09/23 1113) Pulse Rate:  [97-215] 106 (09/23 1113) Resp:  [14-40] 24 (09/23 1113) BP: (125-165)/(85-120) 134/96 mmHg (09/23 1113) SpO2:  [91 %-100 %] 100 % (09/23 1113) FiO2 (%):  [40 %] 40 % (09/23 0746) Weight:  [72.5 kg (159 lb 13.3 oz)] 72.5 kg (159 lb 13.3 oz) (09/23 0500)  PHYSICAL EXAMINATION: General:  Chronically ill appearing obese female. Neuro:  Alert and interactive, moving all ext to command. Head: [place]/AT. EENT:  PERRL, EOM-I and MMM. Cardiovascular:  RRR, Nl S1/S2, -M/R/G. Lungs:  Diffuse crackles. Abdomen:  Soft, NT, ND and +BS. Musculoskeletal:  -edema and -tenderness. Skin:  Intact.   Recent Labs Lab 06/16/15 0850 06/17/15 0240 06/17/15 0915  NA 140 138 138  K 4.5 6.0* 5.7*  CL 110 109 108  CO2 18* 17* 17*  BUN 80* 93* 98*  CREATININE 5.01* 5.25* 5.31*  GLUCOSE 85 165* 125*    Recent Labs Lab 06/16/15 0850 06/17/15 0240 06/17/15 0915  HGB 8.4* 7.1* 7.8*  HCT 26.8* 23.2* 24.3*  WBC 14.3* 13.4* 12.7*  PLT 514* 457* 482*   Dg Chest Portable 1 View  06/16/2015   CLINICAL DATA:  Shortness of breath.  EXAM: PORTABLE CHEST - 1 VIEW  COMPARISON:  June 12, 2015.  FINDINGS: Stable cardiomegaly. New bilateral lung opacities are noted, with right greater than left. This is concerning for pneumonia or possibly edema. No pneumothorax is noted. Possible minimal right pleural effusion is noted. Left-sided pacemaker is noted and unchanged in position. Bony thorax is unremarkable.  IMPRESSION: New bilateral lung opacities are noted, with right greater than left. This is concerning for pneumonia or possibly edema. Minimal right pleural effusion is noted as well.   Electronically Signed   By: Lupita Raider, M.D.   On: 06/16/2015 09:19   I reviewed CXR myself, diffuse patchy infiltrate.  ASSESSMENT / PLAN:  29 year old female with lupus and history of DVT and PE as well on eliquis.  Presenting with SOB and hemoptysis that  started in the hospital. DDx is DAH vs TB vs anticoagulation induced bronchial irritation.   Discussed with TRH-MD.  Hemoptysis:  - Chest CT without contrast as ordered.  - Hold eliquis.  - Check coags.  - Increase steroids to 60 mg IV q6 hours.  Pneumonia: HCAP.  - Pan culture.  - D/C aztreonam.  - Continue ceftaz/vanc for HCAP coverage.  - PCT protocol.  - Tailor abx pending PCT results.  Hypoxemia: likely a combination of above + CHF induced pulmonary edema.  - Lasix as ordered.  - Titrate O2 for sat of 88-92%.  Hyperkalemia:  - Kayexalate.  - Renal consult  - BMET in AM.  PCCM will follow with you.  Alyson Reedy, M.D. [facility] Pulmonary/Critical Care Medicine. Pager: [phone]. After hours pager: [phone].  06/17/2015, 4:45 PM

## 2015-06-17 NOTE — Consult Note (Signed)
Requesting Physician:  Dr.Curtis Sherral Hammers Reason for Consult:  Progressive CKD HPI: The patient is a 29 y.o. year-old AAF with known SLE, DM, hypothyroidism, DVT/PE (not compliant with anticoagulation), systolic heart failure (EF 25-30% by ECHO 04/2015) AICD in place, renal mass (not sure what workup done for this)  who is admitted with progressive renal insufficiency and pulmonary hemorrhage.    She was first evaluated in the [place] in April 2016 when she presented with pleuritic chest pain and dyspnea. Creatinine at that time was in the range of 2.7-3.  She was seen by Dr. Joelyn Oms [facility]) in consultation and reported that she had undergone a kidney biopsy in the past but could not relate to him when or where that was done. She had never been treated with cytoxan or MMF but has been on chronic steroids.   She had been getting medical care between [place] and [place] prior to that admission.  Complements were low and dsDNA 220. She signed out of the hospital AMA during a workup for PE .  She was readmitted August 2016 with acute respiratory failure requiring intubation (felt due to CHF and PE), hypertensive emergency, worsening renal function. She was diuresed lasix for CHF, and anticoagulated with heparin. Creatinine during that admission was around 3.5-3.8. She was discharged on Eliquis.  Nephrology did not see during that admission, but she was scheduled to see Dr. Joelyn Oms 06/01/15 and did not keep that appointment.  Was briefly admitted 9/18-9/20 with cough, foul smelling urine, CP, possible PNA. Treated for URI/UTIUTI and discharged  She is admitted on the current occasion with cough, progressive SOB, tachypalpitations, pleuritic chest pain and some episodes of hemoptysis. Reports worsening edema.  Sleeps sitting up "when she sleeps" Has been coughing chronically but had not seen blood before.     Felt to have acute on chronic heart failure and given lasix 60 BID with good UOP  (3l/24 hours) but renal function has progressively worsened and creatinine is now around 5.3 so we are asked to see. Her urine shows 100 mg% protein, no blood. Last renal US was April 2016 normal sized echogenic kidneys with a left lower pole renal mass. tells me she thinks she had "2 kidney biopsies" in the past - that they "looked worse than expected" and she was never treated with CTX or MMF but has been on steroids. Dialysis has never specifically been discussed with her.  She has an aunt on dialysis in [place]   Because of her hemoptysis she was seen today by pulmonary  and they have advised chest CT, holding anticoagulation (in case [place]), HCAP coverage and higher dose steroids.  As can be seen from the creatinine summary below, renal function has steadily deteriorated since 12/2014.   CREATININE, SER  Date/Time Value Ref Range Status  06/17/2015 09:15 AM 5.31*  K 5.7-6 CO2 17 BUN 98 0.44 - 1.00 mg/dL Final  06/17/2015 02:40 AM 5.25* 0.44 - 1.00 mg/dL Final  06/16/2015 08:50 AM 5.01*  Admitted with cough, SOB, palpitations, hemoptysis 0.44 - 1.00 mg/dL Final  06/13/2015 08:50 AM 4.69* 0.44 - 1.00 mg/dL Final  06/12/2015 02:56 PM 4.79*Admitted with cough, pleuritic CP, foul smelling urine.   0.44 - 1.00 mg/dL Final  05/23/2015 02:50 PM 4.21* Cough, CP. ED visit. V/Q negative 0.44 - 1.00 mg/dL Final  05/02/2015 04:35 AM 3.57* 0.44 - 1.00 mg/dL Final  05/01/2015 05:53 AM 3.42* 0.44 - 1.00 mg/dL Final  04/30/2015 05:33 AM 3.78* 0.44 - 1.00  mg/dL Final  04/29/2015 03:30 AM 3.87* 0.44 - 1.00 mg/dL Final  04/28/2015 02:30 PM 3.76* 0.44 - 1.00 mg/dL Final  04/28/2015 05:00 AM 3.43* 0.44 - 1.00 mg/dL Final  04/27/2015 11:47 AM 3.00* 0.44 - 1.00 mg/dL Final  04/27/2015 11:09 AM 3.09* Admitted with acute resp failure requiring intubation 0.44 - 1.00 mg/dL Final  01/19/2015 09:31 PM 2.92* 0.50 - 1.10 mg/dL Final  01/19/2015 01:36 AM 2.78* 0.50 - 1.10 mg/dL Final  01/18/2015 07:53 PM 2.75* 0.50 -  1.10 mg/dL Final  01/18/2015 12:00 AM 3.05* 0.50 - 1.10 mg/dL Final    Past Medical History  Diagnosis Date  . Lupus   . Arthritis   . Thyroid disease   . Diabetes mellitus without complication   . PE (pulmonary embolism)   . DVT (deep venous thrombosis)     Past Surgical History  Procedure Laterality Date  . Pacemaker insertion    . Cesarean section      Family History: No family history on file. Social History:  reports that she has never smoked. She does not have any smokeless tobacco history on file. She reports that she does not drink alcohol or use illicit drugs. Has a roommate. Lives in subsidized housing.    Allergies  Allergen Reactions  . Amoxicillin Rash  . Ciprofloxacin Itching and Rash  . Sulfa Antibiotics Rash    Home medications: Prior to Admission medications   Medication Sig Start Date End Date Taking? Authorizing Provider  apixaban (ELIQUIS) 5 MG TABS tablet Take 1 tablet (5 mg total) by mouth 2 (two) times daily. 05/08/15  Yes Thurnell Lose, MD  carvedilol (COREG) 12.5 MG tablet Take 1 tablet (12.5 mg total) by mouth 2 (two) times daily with a meal. 05/02/15  Yes Thurnell Lose, MD  furosemide (LASIX) 20 MG tablet Take 1 tablet (20 mg total) by mouth daily. 05/02/15  Yes Thurnell Lose, MD  hydrALAZINE (APRESOLINE) 25 MG tablet Take 1 tablet (25 mg total) by mouth every 8 (eight) hours. 06/14/15  Yes Velvet Bathe, MD  QUEtiapine (SEROQUEL) 50 MG tablet Take 50 mg by mouth at bedtime.   Yes Historical Provider, MD    Inpatient medications: . sodium chloride   Intravenous Once  . sodium chloride   Intravenous Once  . antiseptic oral rinse  7 mL Mouth Rinse BID  . aztreonam  1 g Intravenous 3 times per day  . carvedilol  12.5 mg Oral BID WC  . cefTAZidime (FORTAZ)  IV  1 g Intravenous Q24H  . furosemide  60 mg Intravenous BID  . hydrALAZINE  25 mg Oral 3 times per day  . magnesium sulfate 1 - 4 g bolus IVPB  3 g Intravenous Once  .  methylPREDNISolone (SOLU-MEDROL) injection  60 mg Intravenous Q6H  . metolazone  5 mg Oral Daily  . QUEtiapine  50 mg Oral QHS  . sodium chloride  3 mL Intravenous Q12H  . vancomycin  750 mg Intravenous Q24H    Review of Systems + progressive dyspnea past week, exertional and at rest, worsening leg swelling No chills or fever Noted coughing up blood after coming into the hospital States chest pain currently with pleuritic component No dysuria or hematuria and UOP has picked up (since IV lasix)  Physical Exam:  BP 139/107 mmHg  Pulse 122  Temp(Src) 98.3 F (36.8 C) (Oral)  Resp 25  Ht 4\' 11"  (1.499 m)  Wt 72.5 kg (159 lb 13.3 oz)  BMI 32.27  kg/m2  SpO2 100%  LMP 04/27/2015 (Approximate)  Gen: Soft spoken AAF Wearing O2 sitting on the edge of the bed - currently receiving blood transfusion Skin: no rash, cyanosis + tattoos Neck: no JVD ICD left ant chest Chest: Tachy S1S2 No s3 or S4 Abdomen: soft, obese, no focal tenderness Ext: Trace edema of both LE's No asterixus Neuro: alert, Ox3, no focal deficit Heme/Lymph: no bruising or LAN  Labs: Basic Metabolic Panel:  Recent Labs Lab 06/12/15 1456 06/13/15 0850 06/16/15 0850 06/17/15 0240 06/17/15 0915  NA 139 143 140 138 138  K 4.6 4.9 4.5 6.0* 5.7*  CL 110 115* 110 109 108  CO2 17* 16* 18* 17* 17*  GLUCOSE 122* 83 85 165* 125*  BUN 69* 73* 80* 93* 98*  CREATININE 4.79* 4.69* 5.01* 5.25* 5.31*  CALCIUM 7.9* 8.3* 8.5* 8.4* 8.8*    Recent Labs Lab 06/13/15 0850 06/16/15 0850 06/17/15 0915  AST 13* 27 25  ALT 9* 15 15  ALKPHOS 50 61 49  BILITOT 0.3 0.5 0.5  PROT 5.3* 6.0* 6.5  ALBUMIN 2.3* 2.8* 2.7*    Recent Labs Lab 06/12/15 1456 06/13/15 0850 06/14/15 0100 06/16/15 0850 06/17/15 0240 06/17/15 0915  WBC 10.3 9.4 8.1 14.3* 13.4* 12.7*  NEUTROABS 8.5* 5.8  --  8.7*  --  10.3*  HGB 7.8* 7.0* 7.3* 8.4* 7.1* 7.8*  HCT 25.4* 22.6* 24.1* 26.8* 23.2* 24.3*  MCV 91.0 90.8 91.3 91.8 91.0 90.7  PLT 523*  445* 481* 514* 457* 482*    Recent Labs Lab 06/12/15 2045 06/13/15 0240 06/13/15 0850  TROPONINI 0.03 0.04* <0.03     Recent Labs Lab 06/16/15 1614  GLUCAP 248*    Iron Studies:  Recent Labs Lab 06/13/15 1908  IRON 36  TIBC 213*  FERRITIN 286    Xrays/Other Studies: Dg Chest Portable 1 View  06/16/2015   CLINICAL DATA:  Shortness of breath.  EXAM: PORTABLE CHEST - 1 VIEW  COMPARISON:  June 12, 2015.  FINDINGS: Stable cardiomegaly. New bilateral lung opacities are noted, with right greater than left. This is concerning for pneumonia or possibly edema. No pneumothorax is noted. Possible minimal right pleural effusion is noted. Left-sided pacemaker is noted and unchanged in position. Bony thorax is unremarkable.  IMPRESSION: New bilateral lung opacities are noted, with right greater than left. This is concerning for pneumonia or possibly edema. Minimal right pleural effusion is noted as well.   Electronically Signed   By: Marijo Conception, M.D.   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  Impression/Plan 1. 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 - biopsied (she thinks twice)  in the past but results N/A as pt can't recall where it was done, but never treated with CTX or MMF. Missed her outpt renal followup with Dr. Joelyn Oms. Urine sediment does not look active (+protein, no blood). 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 Doubtful that at this advanced stage that repeating a renal bx would be helpful. Check complements and dsDNA as indicators of lupus activity. Agree with higher dose steroids, but more aggressive "empiric renal directed therapy" not  indicated. She appears to be headed toward dialysis. Discussed with  her that we should proceed with vascular access evaluation when clinically more stable. Save left arm. Check PTH status. 2. Hyperkalemia - kayexalate. K restrict diet. Recheck in the AM 3. Hemoptysis/bilateral pulmonary infiltrates - pulmonary has seen. DDX is DAH secondary to SLE vs pulmonary embolus vs HCAP vs TB. Multiple serologies have also been ordered in this regard (complements, ANCA titers, anti-GBM, APLA, , quantiferon). Steroid dosing has been escalated. Anticoagulation held at the present time.  Chest CT ordered. Unclear from notes if bronch is to be done. Receiving empiric treatment for HCAP,  (plus diuresis for CHF in event that pulmonary edema makes up any part of these infiltrates).  4. Metabolic acidosis - oral sodium bicarbonate. 5. Anemia - blood loss + CKD + chronic ds. Check iron studies. Currently receiving transfusion 6. Systolic heart failure - good uop with current diuretic dosing. Continue lasix 60 IV Q12H + metolazone 7. DM - per primary service 8. DVT/PE - had neg V/Q 05/23/15 for "acute" PE but has had chronic history and on eliquis up to time of current admission. Held now d/t hemoptysis.  Camille Bal,  MD [facility] [phone] pager 06/17/2015, 5:25 PM

## 2015-06-17 NOTE — Progress Notes (Signed)
eLink Physician-Brief Progress Note Patient Name: Paige Aguilar DOB: [birth year] MRN: [id number]   Date of Service  06/17/2015  HPI/Events of Note  Patient requests Ambien to sleep. Currently on 3.0 L/min [place] with sat = 100% and RR = 18.  eICU Interventions  Will order Ambien 5 mg PO at HS X 1.     Intervention Category Minor Interventions: Routine modifications to care plan (e.g. PRN medications for pain, fever)  Sommer,Steven Eugene 06/17/2015, 9:57 PM

## 2015-06-17 NOTE — Progress Notes (Signed)
[place] TEAM 1 - Stepdown/ICU TEAM Progress Note  Paige Aguilar EAV:[id number] DOB: [birth year] DOA: 06/16/2015 PCP: [name], MD  Admit HPI / Brief Narrative: Paige Aguilar is a 29 y.o. 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   The patient was recently hospitalized 4 days ago with chest pain and green sputum production. She was discharged to days ago on Omnicef to complete her antibiotic regimen. Additionally, she will was recently diagnosed in the beginning of August with a DVT. The patient did have chest pain at that time and was presumed to have a PE, although a VQ scan was negative. The patient awoke this morning acutely short of breath, which was worse with laying down and activity and improved with sitting up and rest. Her breathing worsened while at home. EMS was called the patient was found to be restless and using accessory muscles with labored breathing. While in the emergency department, the patient did have several episodes of hemoptysis which totaled approximately 50 mL's.   HPI/Subjective: 9/23 A/O 4, patient states was in ED on Sunday with URI symptoms was discharged home on antibiotics and medication for symptom management. However positive hemoptysis starting yesterday. Would have multiple coughing episodes in which she would cough up ~table spoon full of blood. In addition patient states has been seen a nephrologist in town secondary to her worsening kidney function. Upon admission was on 20 mg prednisone chronically (lupus nephritis?).   Assessment/Plan: acute respiratory failure with hypoxemia -Improved on nasal cannula titrate O2 to assure SPO2> 93% - Hemoptysis/Diffuse Alveolar Hemorrhage (DAH) -PCXR: PNA vs Diffuse Alveolar  Hemorrhage. After reviewing PCXR I favor Diffuse Alveolar  Hemorrhage. -Contacted PCCM Dr. Tyson Alias and Molli Knock who reviewed the CXR and concur with high likelihood of Diffuse Alveolar Hemorrhage with possible underlying HCAP . CHF and MCTD (lupus) may also be contributing factor. -DC'd Eliquis; given Factor VIII Inhibitor Bypassing Activity (FEIBA) in ED - pharmacy to monitor PTT -Hold all anticoagulation agents to include aspirin -Chest CT pending -MCTD workup pending  Acute on chronic combined systolic and diastolic heart failure -Transfuse for Hb< 8 -9/23 Transfuse 1 unit PRBC  -BNP significantly elevated from patient's baseline at 2400 -Strict I's and O's -Lasix 60 mg IV twice daily for diuresis -Echocardiogram; see results below  HTN  -Continue Coreg 12.5 mg BID -Continue Lasix 60 mg BID -Continue hydralazine 25 mg TID -Continue Zaroxolyn 5 mg daily  Pulmonary hypertension -See acute on chronic CHF -If patient's BP will tolerate will initiate afterload reducing agent (Imdur)?  Acute renal failure superimposed on stage IV chronic kidney disease -Most likely multifactorial to include lupus nephritis, CHF,+/-vasculitis. Appropriate serologies ordered and pending -Consult Nephrology  -Gently Diuresis - Avoid nephrotoxic drugs  Hypomagnesmia -Magnesium IV  3gm   Hyperkalemia -Kayexalate 45 gm   SLE/MCTD -Pt Hm dose Prednisone  Daily  -Secondary to patient's possible DAH increase methylprednisone 60 mg IV QID  -MCTD panel pending  DVT/PE -See hemoptysis    Code Status: FULL Family Communication: no family present at time of exam Disposition Plan: Resolution of DAH?, Lupus nephritis?    Consultants: Dr.Wesam Molli [facility] M) Dr.Cynthia Eliott Nine (nephrology)    Procedure/Significant Events: 9/23 Echocardiogram; - Left ventricle: mild concentric hypertrophy. -LVEF=20%- 25%. -Doppler parameters c/wrestrictive physiology, -Findings C/W RV volume and pressure overload. - Mitral valve: moderate  regurgitation. -Tricuspid valve: severe regurgitation. -  Pulmonary arteries:  PApeak pressure: 70 mm Hg (S). -9/23 Transfuse 1 unit PRBC    Culture 9/22 blood left arm 2 NGTD 9/22 urine positive multiple species 9/22 strep pneumo/Legionella urine antigen negative 9/23 urine pending 9/23 sputum pending   Antibiotics: Aztreonam 9/22>> stopped 9/23 Ceftazidime 9/22>> Vancomycin 9/22>>     DVT prophylaxis: SCD   Devices   LINES / TUBES:      Continuous Infusions:   Objective: VITAL SIGNS: Temp: 98.7 F (37.1 C) (09/23 2009) Temp Source: Oral (09/23 2009) BP: 147/119 mmHg (09/23 2009) Pulse Rate: 113 (09/23 2009) SPO2; FIO2:   Intake/Output Summary (Last 24 hours) at 06/17/15 2100 Last data filed at 06/17/15 1937  Gross per 24 hour  Intake   1869 ml  Output   2150 ml  Net   -281 ml     Exam: General: A/O 4, positive acute respiratory distress, negative hemoptysis Eyes: Negative headache, negative scleral hemorrhage ENT: Negative Runny nose, negative gingival bleeding, Neck:  Negative scars, masses, torticollis, lymphadenopathy, JVD Lungs: diffuse poor air movement, negative wheezes, bibasilar crackles Cardiovascular: Tachycardic, Regular rhythm without murmur gallop or rub normal S1 and S2 Abdomen:negative abdominal pain, nondistended, positive soft, bowel sounds, no rebound, no ascites, no appreciable mass Extremities: No significant cyanosis, clubbing, or edema bilateral lower extremities Psychiatric:  Negative depression, negative anxiety, negative fatigue, negative mania  Neurologic:  Cranial nerves II through XII intact, tongue/uvula midline, all extremities muscle strength 5/5, sensation intact throughout,  negative dysarthria, negative expressive aphasia, negative receptive aphasia.   Data Reviewed: Basic Metabolic Panel:  Recent Labs Lab 06/12/15 1456 06/13/15 0850 06/16/15 0850 06/17/15 0240 06/17/15 0915  NA 139 143 140 138 138  K  4.6 4.9 4.5 6.0* 5.7*  CL 110 115* 110 109 108  CO2 17* 16* 18* 17* 17*  GLUCOSE 122* 83 85 165* 125*  BUN 69* 73* 80* 93* 98*  CREATININE 4.79* 4.69* 5.01* 5.25* 5.31*  CALCIUM 7.9* 8.3* 8.5* 8.4* 8.8*  MG  --   --   --   --  1.6*   Liver Function Tests:  Recent Labs Lab 06/12/15 1456 06/13/15 0850 06/16/15 0850 06/17/15 0915  AST 17 13* 27 25  ALT 11* 9* 15 15  ALKPHOS 65 50 61 49  BILITOT 0.4 0.3 0.5 0.5  PROT 6.0* 5.3* 6.0* 6.5  ALBUMIN 2.6* 2.3* 2.8* 2.7*   No results for input(s): LIPASE, AMYLASE in the last 168 hours. No results for input(s): AMMONIA in the last 168 hours. CBC:  Recent Labs Lab 06/12/15 1456 06/13/15 0850 06/14/15 0100 06/16/15 0850 06/17/15 0240 06/17/15 0915  WBC 10.3 9.4 8.1 14.3* 13.4* 12.7*  NEUTROABS 8.5* 5.8  --  8.7*  --  10.3*  HGB 7.8* 7.0* 7.3* 8.4* 7.1* 7.8*  HCT 25.4* 22.6* 24.1* 26.8* 23.2* 24.3*  MCV 91.0 90.8 91.3 91.8 91.0 90.7  PLT 523* 445* 481* 514* 457* 482*   Cardiac Enzymes:  Recent Labs Lab 06/12/15 2045 06/13/15 0240 06/13/15 0850  TROPONINI 0.03 0.04* <0.03   BNP (last 3 results)  Recent Labs  05/23/15 1450 06/12/15 1456 06/16/15 0850  BNP 749.0* 915.5* 2425.6*    ProBNP (last 3 results) No results for input(s): PROBNP in the last 8760 hours.  CBG:  Recent Labs Lab 06/16/15 1614  GLUCAP 248*    Recent Results (from the past 240 hour(s))  Urine culture     Status: None   Collection Time: 06/12/15  4:49 PM  Result Value Ref Range  Status   Specimen Description URINE, CLEAN CATCH  Final   Special Requests NONE  Final   Culture >=100,000 COLONIES/mL ESCHERICHIA COLI  Final   Report Status 06/15/2015 FINAL  Final   Organism ID, Bacteria ESCHERICHIA COLI  Final      Susceptibility   Escherichia coli - MIC*    AMPICILLIN >=32 RESISTANT Resistant     CEFAZOLIN <=4 SENSITIVE Sensitive     CEFTRIAXONE <=1 SENSITIVE Sensitive     CIPROFLOXACIN <=0.25 SENSITIVE Sensitive     GENTAMICIN <=1  SENSITIVE Sensitive     IMIPENEM <=0.25 SENSITIVE Sensitive     NITROFURANTOIN <=16 SENSITIVE Sensitive     TRIMETH/SULFA <=20 SENSITIVE Sensitive     AMPICILLIN/SULBACTAM 8 SENSITIVE Sensitive     PIP/TAZO <=4 SENSITIVE Sensitive     * >=100,000 COLONIES/mL ESCHERICHIA COLI  MRSA PCR Screening     Status: None   Collection Time: 06/12/15 10:43 PM  Result Value Ref Range Status   MRSA by PCR NEGATIVE NEGATIVE Final    Comment:        The GeneXpert MRSA Assay (FDA approved for NASAL specimens only), is one component of a comprehensive MRSA colonization surveillance program. It is not intended to diagnose MRSA infection nor to guide or monitor treatment for MRSA infections.   Blood culture (routine x 2)     Status: None (Preliminary result)   Collection Time: 06/16/15 10:15 AM  Result Value Ref Range Status   Specimen Description BLOOD LEFT ARM  Final   Special Requests BOTTLES DRAWN AEROBIC AND ANAEROBIC 5CC  Final   Culture NO GROWTH 1 DAY  Final   Report Status PENDING  Incomplete  Blood culture (routine x 2)     Status: None (Preliminary result)   Collection Time: 06/16/15 10:39 AM  Result Value Ref Range Status   Specimen Description BLOOD LEFT ARM  Final   Special Requests BOTTLES DRAWN AEROBIC AND ANAEROBIC 3CC  Final   Culture NO GROWTH 1 DAY  Final   Report Status PENDING  Incomplete  Urine culture     Status: None   Collection Time: 06/16/15 11:37 AM  Result Value Ref Range Status   Specimen Description URINE, CLEAN CATCH  Final   Special Requests NONE  Final   Culture MULTIPLE SPECIES PRESENT, SUGGEST RECOLLECTION  Final   Report Status 06/17/2015 FINAL  Final     Studies:  Recent x-ray studies have been reviewed in detail by the Attending Physician  Scheduled Meds:  Scheduled Meds: . sodium chloride   Intravenous Once  . sodium chloride   Intravenous Once  . antiseptic oral rinse  7 mL Mouth Rinse BID  . carvedilol  12.5 mg Oral BID WC  . cefTAZidime  (FORTAZ)  IV  1 g Intravenous Q24H  . furosemide  60 mg Intravenous BID  . hydrALAZINE  25 mg Oral 3 times per day  . methylPREDNISolone (SOLU-MEDROL) injection  60 mg Intravenous Q6H  . metolazone  5 mg Oral Daily  . QUEtiapine  50 mg Oral QHS  . sodium bicarbonate  650 mg Oral TID  . sodium chloride  3 mL Intravenous Q12H  . vancomycin  750 mg Intravenous Q24H    Time spent on care of this patient: 40 mins   WOODS, Roselind Messier , MD  [facility] Office  [phone] Pager - [phone]  On-Call/Text Page:      [email or web address]      password [email or web address]  If 7PM-7AM, please contact night-coverage  [email or web address] Password [email or web address] 06/17/2015, 9:00 PM   LOS: 1 day   Care during the described time interval was provided by me .  I have reviewed this patient's available data, including medical history, events of note, physical examination, and all test results as part of my evaluation. I have personally reviewed and interpreted all radiology studies.   Carolyne Littles, MD [phone] Pager

## 2015-06-18 DIAGNOSIS — Z86711 Personal history of pulmonary embolism: Secondary | ICD-10-CM | POA: Diagnosis present

## 2015-06-18 DIAGNOSIS — N179 Acute kidney failure, unspecified: Secondary | ICD-10-CM

## 2015-06-18 DIAGNOSIS — A419 Sepsis, unspecified organism: Secondary | ICD-10-CM

## 2015-06-18 DIAGNOSIS — IMO0001 Reserved for inherently not codable concepts without codable children: Secondary | ICD-10-CM | POA: Diagnosis present

## 2015-06-18 DIAGNOSIS — N184 Chronic kidney disease, stage 4 (severe): Secondary | ICD-10-CM

## 2015-06-18 DIAGNOSIS — R0489 Hemorrhage from other sites in respiratory passages: Secondary | ICD-10-CM | POA: Diagnosis present

## 2015-06-18 LAB — CBC WITH DIFFERENTIAL/PLATELET
BASOS ABS: 0 10*3/uL (ref 0.0–0.1)
Basophils Relative: 0 %
EOS ABS: 0 10*3/uL (ref 0.0–0.7)
EOS PCT: 0 %
HCT: 26.8 % — ABNORMAL LOW (ref 36.0–46.0)
Hemoglobin: 8.7 g/dL — ABNORMAL LOW (ref 12.0–15.0)
LYMPHS ABS: 1 10*3/uL (ref 0.7–4.0)
LYMPHS PCT: 8 %
MCH: 28.8 pg (ref 26.0–34.0)
MCHC: 32.5 g/dL (ref 30.0–36.0)
MCV: 88.7 fL (ref 78.0–100.0)
MONO ABS: 0.4 10*3/uL (ref 0.1–1.0)
Monocytes Relative: 3 %
Neutro Abs: 12.5 10*3/uL — ABNORMAL HIGH (ref 1.7–7.7)
Neutrophils Relative %: 89 %
PLATELETS: 428 10*3/uL — AB (ref 150–400)
RBC: 3.02 MIL/uL — AB (ref 3.87–5.11)
RDW: 16.5 % — AB (ref 11.5–15.5)
WBC: 14 10*3/uL — AB (ref 4.0–10.5)

## 2015-06-18 LAB — BASIC METABOLIC PANEL
Anion gap: 16 — ABNORMAL HIGH (ref 5–15)
BUN: 111 mg/dL — AB (ref 6–20)
CALCIUM: 8.5 mg/dL — AB (ref 8.9–10.3)
CHLORIDE: 105 mmol/L (ref 101–111)
CO2: 19 mmol/L — ABNORMAL LOW (ref 22–32)
CREATININE: 5.57 mg/dL — AB (ref 0.44–1.00)
GFR calc non Af Amer: 9 mL/min — ABNORMAL LOW (ref >60)
GFR, EST AFRICAN AMERICAN: 11 mL/min — AB (ref >60)
Glucose, Bld: 206 mg/dL — ABNORMAL HIGH (ref 65–99)
Potassium: 4.7 mmol/L (ref 3.5–5.1)
SODIUM: 140 mmol/L (ref 135–145)

## 2015-06-18 LAB — ALBUMIN: Albumin: 2.6 g/dL — ABNORMAL LOW (ref 3.5–5.0)

## 2015-06-18 LAB — IRON AND TIBC
Iron: 63 ug/dL (ref 28–170)
SATURATION RATIOS: 30 % (ref 10.4–31.8)
TIBC: 209 ug/dL — AB (ref 250–450)
UIBC: 146 ug/dL

## 2015-06-18 LAB — TYPE AND SCREEN
ABO/RH(D): O POS
Antibody Screen: POSITIVE
DAT, IGG: NEGATIVE
Donor AG Type: NEGATIVE
UNIT DIVISION: 0

## 2015-06-18 LAB — PHOSPHORUS: Phosphorus: 8.5 mg/dL — ABNORMAL HIGH (ref 2.5–4.6)

## 2015-06-18 LAB — LACTIC ACID, PLASMA: LACTIC ACID, VENOUS: 1.6 mmol/L (ref 0.5–2.0)

## 2015-06-18 LAB — PROCALCITONIN: PROCALCITONIN: 6.74 ng/mL

## 2015-06-18 LAB — SEDIMENTATION RATE: Sed Rate: 68 mm/hr — ABNORMAL HIGH (ref 0–22)

## 2015-06-18 LAB — FERRITIN: FERRITIN: 434 ng/mL — AB (ref 11–307)

## 2015-06-18 LAB — MAGNESIUM: MAGNESIUM: 2.7 mg/dL — AB (ref 1.7–2.4)

## 2015-06-18 MED ORDER — ZOLPIDEM TARTRATE 5 MG PO TABS
5.0000 mg | ORAL_TABLET | Freq: Once | ORAL | Status: AC
Start: 2015-06-18 — End: 2015-06-18
  Administered 2015-06-18: 5 mg via ORAL
  Filled 2015-06-18: qty 1

## 2015-06-18 MED ORDER — HYDRALAZINE HCL 25 MG PO TABS
35.0000 mg | ORAL_TABLET | Freq: Three times a day (TID) | ORAL | Status: DC
  Administered 2015-06-18 – 2015-06-21 (×8): 35 mg via ORAL
  Filled 2015-06-18 (×16): qty 1

## 2015-06-18 MED ORDER — METOPROLOL TARTRATE 1 MG/ML IV SOLN
5.0000 mg | Freq: Four times a day (QID) | INTRAVENOUS | Status: DC | PRN
Start: 2015-06-18 — End: 2015-06-21
  Administered 2015-06-18: 5 mg via INTRAVENOUS
  Filled 2015-06-18: qty 5

## 2015-06-18 MED ORDER — METHYLPREDNISOLONE SODIUM SUCC 125 MG IJ SOLR
60.0000 mg | Freq: Two times a day (BID) | INTRAMUSCULAR | Status: DC
  Administered 2015-06-18 – 2015-06-20 (×4): 60 mg via INTRAVENOUS
  Filled 2015-06-18 (×4): qty 2

## 2015-06-18 MED ORDER — DARBEPOETIN ALFA 100 MCG/0.5ML IJ SOSY
100.0000 ug | PREFILLED_SYRINGE | INTRAMUSCULAR | Status: DC
  Administered 2015-06-18: 100 ug via SUBCUTANEOUS
  Filled 2015-06-18: qty 0.5

## 2015-06-18 NOTE — Progress Notes (Signed)
Name: Paige Aguilar MRN: [id number] DOB: 31-Aug-1986    ADMISSION DATE:  06/16/2015 CONSULTATION DATE:  06/17/2015  REFERRING MD :  Dr. Joseph Art, [facility]  CHIEF COMPLAINT:  Hemoptysis and tachycardia  BRIEF PATIENT DESCRIPTION: 29 year old female with history of severe lupus chronically on steroids (prednisone 20 mg daily).  Presents to the hospital with hemoptysis.  She was admitted 9/18 then discharged 9/20 for SOB, required BiPAP at the time.  Was diuresed and discharged.  She also has history of stage 4 CKD.  Presents back on 9/22 with hemoptysis.  CXR with diffuse opacification noted.  PCCM consulted for [facility].  Hemoptysis described as frank blood with no clots or sputum mixed in.  Bright in color.  No fever/chills.  Fluctuating weight up and down but no specific weight loss.  Lives in a shelter currently. She was on eliquis for h/o VTE    SIGNIFICANT EVENTS  9/22 admission and hemoptysis  STUDIES:  9/22 CT of the chest >> edema vs blood     SUBJECTIVE: no more hemoptysis Breathing ok No chest pain  VITAL SIGNS: Temp:  [98 F (36.7 C)-98.7 F (37.1 C)] 98.5 F (36.9 C) (09/24 0700) Pulse Rate:  [96-133] 116 (09/24 0700) Resp:  [17-28] 23 (09/24 0700) BP: (121-150)/(82-119) 137/93 mmHg (09/24 0700) SpO2:  [90 %-100 %] 94 % (09/24 0700) Weight:  [71.7 kg (158 lb 1.1 oz)] 71.7 kg (158 lb 1.1 oz) (09/24 0400)  PHYSICAL EXAMINATION: General:  Chronically ill appearing obese female. Neuro:  Alert and interactive, moving all ext to command. Head: [place]/AT. EENT:  PERRL, EOM-I and MMM. Cardiovascular:  RRR, Nl S1/S2, -M/R/G. Lungs:  Diffuse crackles. Abdomen:  Soft, NT, ND and +BS. Musculoskeletal:  -edema and -tenderness. Skin:  Intact.   Recent Labs Lab 06/17/15 0240 06/17/15 0915 06/18/15 0425  NA 138 138 140  K 6.0* 5.7* 4.7  CL 109 108 105  CO2 17* 17* 19*  BUN 93* 98* 111*  CREATININE 5.25* 5.31* 5.57*  GLUCOSE 165* 125* 206*    Recent Labs Lab  06/17/15 0240 06/17/15 0915 06/18/15 0425  HGB 7.1* 7.8* 8.7*  HCT 23.2* 24.3* 26.8*  WBC 13.4* 12.7* 14.0*  PLT 457* 482* 428*   Ct Chest Wo Contrast  06/17/2015   CLINICAL DATA:  Chest pain and shortness of breath. Diffuse airspace disease on plain films.  EXAM: CT CHEST WITHOUT CONTRAST  TECHNIQUE: Multidetector CT imaging of the chest was performed following the standard protocol without IV contrast.  COMPARISON:  06/16/2015 chest x-ray.  Chest CT 01/18/2015.  FINDINGS: There is cardiomegaly. Pacer is in place with leads in the right atrium and right ventricle. Aorta is normal caliber. No mediastinal, hilar, or axillary adenopathy.  Diffuse bilateral airspace opacities are noted sparing the periphery. Small bilateral pleural effusions are noted.  Chest wall soft tissues are unremarkable. Imaging into the upper abdomen shows no acute findings.  Mild T12 compression fracture through the superior endplate which appears to have progressed slightly since April.  IMPRESSION: Cardiomegaly. Diffuse bilateral airspace opacities with small effusions. Findings could reflect edema, infection or hemorrhage. Favor edema/CHF.  Progressive chronic T12 compression fracture.   Electronically Signed   By: Charlett Nose M.D.   On: 06/17/2015 19:21   Dg Chest Portable 1 View  06/16/2015   CLINICAL DATA:  Shortness of breath.  EXAM: PORTABLE CHEST - 1 VIEW  COMPARISON:  June 12, 2015.  FINDINGS: Stable cardiomegaly. New bilateral lung opacities are noted, with right greater  than left. This is concerning for pneumonia or possibly edema. No pneumothorax is noted. Possible minimal right pleural effusion is noted. Left-sided pacemaker is noted and unchanged in position. Bony thorax is unremarkable.  IMPRESSION: New bilateral lung opacities are noted, with right greater than left. This is concerning for pneumonia or possibly edema. Minimal right pleural effusion is noted as well.   Electronically Signed   By: Lupita Raider, M.D.   On: 06/16/2015 09:19   I reviewed CXR myself, diffuse patchy infiltrate.  ASSESSMENT / PLAN:  30 year old female with lupus and history of DVT and PE as well on eliquis.  Presenting with SOB and hemoptysis that started in the hospital. DDx is DAH vs TB vs anticoagulation induced bronchial irritation.   Discussed with [facility]-MD.  Hemoptysis: likely due to eliquis & worsening renal function, doubt DAH  - dc eliquis.  - drop steroids to 60 mg IV q12 hours today, then usual dose  Pneumonia: HCAP, pct slight high.  - Pan culture.  - D/C aztreonam.  - Continue ceftaz/vanc for HCAP coverage.    Acute systolic CHF Hypoxemia: likely a combination of above + CHF induced pulmonary edema, resolved  - Lasix as ordered.  - Titrate O2 for sat of 88-92%.  Progressive CKD - appears to  Be heading towards dialysis, may need vascular access Hyperkalemia:resolved  H/o VTE - if hemoptysis subsides, can start IV heparin in 24h, ultimately will need coumadin    Cyril Mourning MD. FCCP. [facility] Pager [phone] If no response call [phone]   06/18/2015, 8:21 AM

## 2015-06-18 NOTE — Progress Notes (Signed)
Notified md per pt's request for Ambien to help sleep.  New orders received.  Will continue to monitor. Paige Aguilar

## 2015-06-18 NOTE — Progress Notes (Signed)
[facility] TEAM 1 - Stepdown/ICU TEAM Progress Note  Jaylan Duggar UJW:[id number] DOB: 01-16-1986 DOA: 06/16/2015 PCP: [name], MD  Admit HPI / Brief Narrative: Paige Aguilar is a 29 y.o. 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   The patient was recently hospitalized 4 days ago with chest pain and green sputum production. She was discharged to days ago on Omnicef to complete her antibiotic regimen. Additionally, she will was recently diagnosed in the beginning of August with a DVT. The patient did have chest pain at that time and was presumed to have a PE, although a VQ scan was negative. The patient awoke this morning acutely short of breath, which was worse with laying down and activity and improved with sitting up and rest. Her breathing worsened while at home. EMS was called the patient was found to be restless and using accessory muscles with labored breathing. While in the emergency department, the patient did have several episodes of hemoptysis which totaled approximately 50 mL's.   HPI/Subjective: 9/23 A/O 4, patient states was in ED on Sunday with URI symptoms was discharged home on antibiotics and medication for symptom management. However positive hemoptysis starting yesterday. Would have multiple coughing episodes in which she would cough up ~table spoon full of blood. In addition patient states has been seen a nephrologist in town secondary to her worsening kidney function. Upon admission was on 20 mg prednisone chronically (lupus nephritis?).   Assessment/Plan: acute respiratory failure with hypoxemia -Improved on nasal cannula titrate O2 to assure SPO2> 93% - Hemoptysis/Diffuse Alveolar Hemorrhage (DAH) -PCXR: PNA vs Diffuse Alveolar  Hemorrhage. After reviewing PCXR I favor Diffuse Alveolar  Hemorrhage. -Contacted PCCM Dr. Tyson Alias and Molli Knock who reviewed the CXR and concur with high likelihood of Diffuse Alveolar Hemorrhage with possible underlying HCAP . CHF and MCTD (lupus) may also be contributing factor. -DC'd Eliquis; given Factor VIII Inhibitor Bypassing Activity (FEIBA) in ED - pharmacy to monitor PTT -Hold all anticoagulation agents to include aspirin -Chest CT; multifocal PNA vs DAH vs pulmonary edema -MCTD workup pending  Acute on chronic combined systolic and diastolic heart failure -Transfuse for Hb< 8 -9/23 Transfuse 1 unit PRBC  -BNP significantly elevated from patient's baseline at 2400 -Strict I's and O's since admission;-3.1 L -Continue Lasix 60 mg IV twice daily for diuresis -Echocardiogram; see results below  HTN  -Continue Coreg 12.5 mg BID -Metoprolol IV 5 mg PRN SBP> 150 or DBP>100 or HR >100 -Continue Lasix 60 mg BID -Increase Hydralazine 35 mg TID; will help with pulmonary hypertension -Continue Zaroxolyn 5 mg daily  Pulmonary hypertension -See acute on chronic CHF  Acute renal failure superimposed on stage IV chronic kidney disease -Most likely multifactorial to include lupus nephritis, CHF,+/-vasculitis. Appropriate serologies ordered and pending -Continue diuresis per nephrology  - Avoid nephrotoxic drugs -Vein mapping on Monday; patient most likely will require dialysis in the near future  Hypomagnesmia -Magnesium IV  3gm   Hyperkalemia -Kayexalate 45 gm   SLE/MCTD -Pt Hm dose Prednisone  Daily  -Secondary to patient's possible DAH increase methylprednisone 60 mg IV QID  -MCTD panel pending  DVT/PE -See hemoptysis    Code Status: FULL Family Communication: no family present at time of exam Disposition Plan: Resolution of DAH?, Lupus nephritis?    Consultants: Dr.Wesam Molli Knock [facility] M) Dr.Cynthia Eliott Nine (nephrology)    Procedure/Significant Events: 9/23 Echocardiogram; -  Left ventricle: mild concentric  hypertrophy. -LVEF=20%- 25%. -Doppler parameters c/wrestrictive physiology, -Findings C/W RV volume and pressure overload. - Mitral valve: moderate regurgitation. -Tricuspid valve: severe regurgitation. - Pulmonary arteries:  PApeak pressure: 70 mm Hg (S). -9/23 Transfuse 1 unit PRBC  9/23 CT chest without contrast;-Cardiomegaly.- Diffuse bilateral airspace opacities with small effusions. Edema, vs Infx vs  Hemorrhage vs edema/CHF. -Progressive chronic T12 compression fracture.    Culture 9/22 blood left arm 2 NGTD 9/22 urine positive multiple species 9/22 strep pneumo/Legionella urine antigen negative 9/23 urine pending 9/23 sputum pending   Antibiotics: Aztreonam 9/22>> stopped 9/23 Ceftazidime 9/22>> Vancomycin 9/22>>     DVT prophylaxis: SCD   Devices   LINES / TUBES:      Continuous Infusions:   Objective: VITAL SIGNS: Temp: 98.1 F (36.7 C) (09/24 1600) Temp Source: Oral (09/24 1600) BP: 147/105 mmHg (09/24 1700) Pulse Rate: 102 (09/24 1700) SPO2; FIO2:   Intake/Output Summary (Last 24 hours) at 06/18/15 1854 Last data filed at 06/18/15 1829  Gross per 24 hour  Intake   1223 ml  Output   1950 ml  Net   -727 ml     Exam: General: A/O 4, positive acute respiratory distress, negative hemoptysis Eyes: Negative headache, negative scleral hemorrhage ENT: Negative Runny nose, negative gingival bleeding, Neck:  Negative scars, masses, torticollis, lymphadenopathy, JVD Lungs: diffuse poor air movement, negative wheezes, bibasilar crackles Cardiovascular: Tachycardic, Regular rhythm without murmur gallop or rub normal S1 and S2 Abdomen:negative abdominal pain, nondistended, positive soft, bowel sounds, no rebound, no ascites, no appreciable mass Extremities: No significant cyanosis, clubbing, or edema bilateral lower extremities Psychiatric:  Negative depression, negative anxiety, negative fatigue, negative mania  Neurologic:  Cranial nerves II  through XII intact, tongue/uvula midline, all extremities muscle strength 5/5, sensation intact throughout,  negative dysarthria, negative expressive aphasia, negative receptive aphasia.   Data Reviewed: Basic Metabolic Panel:  Recent Labs Lab 06/13/15 0850 06/16/15 0850 06/17/15 0240 06/17/15 0915 06/18/15 0425  NA 143 140 138 138 140  K 4.9 4.5 6.0* 5.7* 4.7  CL 115* 110 109 108 105  CO2 16* 18* 17* 17* 19*  GLUCOSE 83 85 165* 125* 206*  BUN 73* 80* 93* 98* 111*  CREATININE 4.69* 5.01* 5.25* 5.31* 5.57*  CALCIUM 8.3* 8.5* 8.4* 8.8* 8.5*  MG  --   --   --  1.6* 2.7*  PHOS  --   --   --   --  8.5*   Liver Function Tests:  Recent Labs Lab 06/12/15 1456 06/13/15 0850 06/16/15 0850 06/17/15 0915 06/18/15 0425  AST 17 13* 27 25  --   ALT 11* 9* 15 15  --   ALKPHOS 65 50 61 49  --   BILITOT 0.4 0.3 0.5 0.5  --   PROT 6.0* 5.3* 6.0* 6.5  --   ALBUMIN 2.6* 2.3* 2.8* 2.7* 2.6*   No results for input(s): LIPASE, AMYLASE in the last 168 hours. No results for input(s): AMMONIA in the last 168 hours. CBC:  Recent Labs Lab 06/12/15 1456 06/13/15 0850 06/14/15 0100 06/16/15 0850 06/17/15 0240 06/17/15 0915 06/18/15 0425  WBC 10.3 9.4 8.1 14.3* 13.4* 12.7* 14.0*  NEUTROABS 8.5* 5.8  --  8.7*  --  10.3* 12.5*  HGB 7.8* 7.0* 7.3* 8.4* 7.1* 7.8* 8.7*  HCT 25.4* 22.6* 24.1* 26.8* 23.2* 24.3* 26.8*  MCV 91.0 90.8 91.3 91.8 91.0 90.7 88.7  PLT 523* 445* 481* 514* 457* 482* 428*   Cardiac Enzymes:  Recent  Labs Lab 06/12/15 2045 06/13/15 0240 06/13/15 0850  TROPONINI 0.03 0.04* <0.03   BNP (last 3 results)  Recent Labs  05/23/15 1450 06/12/15 1456 06/16/15 0850  BNP 749.0* 915.5* 2425.6*    ProBNP (last 3 results) No results for input(s): PROBNP in the last 8760 hours.  CBG:  Recent Labs Lab 06/16/15 1614  GLUCAP 248*    Recent Results (from the past 240 hour(s))  Urine culture     Status: None   Collection Time: 06/12/15  4:49 PM  Result Value  Ref Range Status   Specimen Description URINE, CLEAN CATCH  Final   Special Requests NONE  Final   Culture >=100,000 COLONIES/mL ESCHERICHIA COLI  Final   Report Status 06/15/2015 FINAL  Final   Organism ID, Bacteria ESCHERICHIA COLI  Final      Susceptibility   Escherichia coli - MIC*    AMPICILLIN >=32 RESISTANT Resistant     CEFAZOLIN <=4 SENSITIVE Sensitive     CEFTRIAXONE <=1 SENSITIVE Sensitive     CIPROFLOXACIN <=0.25 SENSITIVE Sensitive     GENTAMICIN <=1 SENSITIVE Sensitive     IMIPENEM <=0.25 SENSITIVE Sensitive     NITROFURANTOIN <=16 SENSITIVE Sensitive     TRIMETH/SULFA <=20 SENSITIVE Sensitive     AMPICILLIN/SULBACTAM 8 SENSITIVE Sensitive     PIP/TAZO <=4 SENSITIVE Sensitive     * >=100,000 COLONIES/mL ESCHERICHIA COLI  MRSA PCR Screening     Status: None   Collection Time: 06/12/15 10:43 PM  Result Value Ref Range Status   MRSA by PCR NEGATIVE NEGATIVE Final    Comment:        The GeneXpert MRSA Assay (FDA approved for NASAL specimens only), is one component of a comprehensive MRSA colonization surveillance program. It is not intended to diagnose MRSA infection nor to guide or monitor treatment for MRSA infections.   Blood culture (routine x 2)     Status: None (Preliminary result)   Collection Time: 06/16/15 10:15 AM  Result Value Ref Range Status   Specimen Description BLOOD LEFT ARM  Final   Special Requests BOTTLES DRAWN AEROBIC AND ANAEROBIC 5CC  Final   Culture NO GROWTH 2 DAYS  Final   Report Status PENDING  Incomplete  Blood culture (routine x 2)     Status: None (Preliminary result)   Collection Time: 06/16/15 10:39 AM  Result Value Ref Range Status   Specimen Description BLOOD LEFT ARM  Final   Special Requests BOTTLES DRAWN AEROBIC AND ANAEROBIC 3CC  Final   Culture NO GROWTH 2 DAYS  Final   Report Status PENDING  Incomplete  Urine culture     Status: None   Collection Time: 06/16/15 11:37 AM  Result Value Ref Range Status   Specimen  Description URINE, CLEAN CATCH  Final   Special Requests NONE  Final   Culture MULTIPLE SPECIES PRESENT, SUGGEST RECOLLECTION  Final   Report Status 06/17/2015 FINAL  Final     Studies:  Recent x-ray studies have been reviewed in detail by the Attending Physician  Scheduled Meds:  Scheduled Meds: . sodium chloride   Intravenous Once  . sodium chloride   Intravenous Once  . antiseptic oral rinse  7 mL Mouth Rinse BID  . carvedilol  12.5 mg Oral BID WC  . cefTAZidime (FORTAZ)  IV  1 g Intravenous Q24H  . darbepoetin (ARANESP) injection - NON-DIALYSIS  100 mcg Subcutaneous Q Sat-1800  . furosemide  60 mg Intravenous BID  . hydrALAZINE  35 mg  Oral 3 times per day  . methylPREDNISolone (SOLU-MEDROL) injection  60 mg Intravenous Q12H  . metolazone  5 mg Oral Daily  . QUEtiapine  50 mg Oral QHS  . sodium bicarbonate  650 mg Oral TID  . sodium chloride  3 mL Intravenous Q12H  . vancomycin  750 mg Intravenous Q24H    Time spent on care of this patient: 40 mins   WOODS, Roselind Messier , MD  [facility] Office  [phone] Pager - [phone]  On-Call/Text Page:      [email or web address]      password [email or web address]  If 7PM-7AM, please contact night-coverage [email or web address] Password [facility] 06/18/2015, 6:54 PM   LOS: 2 days   Care during the described time interval was provided by me .  I have reviewed this patient's available data, including medical history, events of note, physical examination, and all test results as part of my evaluation. I have personally reviewed and interpreted all radiology studies.   Carolyne Littles, MD [phone] Pager

## 2015-06-18 NOTE — Progress Notes (Signed)
In to obtain ABG sample. Pt refusing at this time stating 3 previous attempts were unsuccessful and the stick is too painful. Pt left resting, alert and oriented, stable on room air. VSS and NAD noted.

## 2015-06-18 NOTE — Progress Notes (Addendum)
[facility] Rounding Note Subjective:  Feels overall better Now wearing O2 right now and not overtly dyspneic No more hemoptysis Understands that we need to start making preparations for the eventual need for HD Updated her cousin/caregiver over the phone   Objective Vital signs in last 24 hours: Filed Vitals:   06/18/15 0630 06/18/15 0700 06/18/15 0800 06/18/15 0900  BP: 138/97 137/93 140/102 155/116  Pulse:  116 122 119  Temp:  98.5 F (36.9 C)    TempSrc:  Oral    Resp:  Height:      Weight:      SpO2:  94% 94% 95%   Weight change: -2.009 kg (-4 lb 6.9 oz)  Intake/Output Summary (Last 24 hours) at 06/18/15 2536 Last data filed at 06/18/15 0800  Gross per 24 hour  Intake   1769 ml  Output   2000 ml  Net   -231 ml    Physical Exam:  BP 155/116 mmHg  Pulse 119  Temp(Src) 98.5 F (36.9 C) (Oral)  Resp 17  Ht  (1.499 m)  Wt 71.7 kg (158 lb 1.1 oz)  BMI 31.91 kg/m2  SpO2 95%  LMP 04/27/2015 (Approximate)  Gen: Soft spoken AAF Wearing O2 sitting on the edge of the bed  Skin: no rash, cyanosis + tattoos Neck: no JVD ICD left ant chest Chest: Tachy S1S2 No s3 or S4 Abdomen: soft, obese, no focal tenderness Ext: Trace edema of both LE's No asterixus Neuro: alert, Ox3, no focal deficit Heme/Lymph: no bruising or LAN   Labs: Basic Metabolic Panel:  Recent Labs Lab 06/12/15 1456 06/13/15 0850 06/16/15 0850 06/17/15 0240 06/17/15 0915 06/18/15 0425  NA 139 143 140 138 138 140  K 4.6 4.9 4.5 6.0* 5.7* 4.7  CL 110 115* 110 109 108 105  CO2 17* 16* 18* 17* 17* 19*  GLUCOSE 122* 83 85 165* 125* 206*  BUN 69* 73* 80* 93* 98* 111*  CREATININE 4.79* 4.69* 5.01* 5.25* 5.31* 5.57*  CALCIUM 7.9* 8.3* 8.5* 8.4* 8.8* 8.5*  PHOS  --   --   --   --   --  8.5*   Liver Function Tests:  Recent Labs Lab 06/13/15 0850 06/16/15 0850 06/17/15 0915 06/18/15 0425  AST 13* 27 25  --   ALT 9* 15 15  --   ALKPHOS 50 61 49  --   BILITOT  0.3 0.5 0.5  --   PROT 5.3* 6.0* 6.5  --   ALBUMIN 2.3* 2.8* 2.7* 2.6*   Recent Labs Lab 06/13/15 0850  06/16/15 0850 06/17/15 0240 06/17/15 0915 06/18/15 0425  WBC 9.4  < > 14.3* 13.4* 12.7* 14.0*  NEUTROABS 5.8  --  8.7*  --  10.3* 12.5*  HGB 7.0*  < > 8.4* 7.1* 7.8* 8.7*  HCT 22.6*  < > 26.8* 23.2* 24.3* 26.8*  MCV 90.8  < > 91.8 91.0 90.7 88.7  PLT 445*  < > 514* 457* 482* 428*  < > = values in this interval not displayed.    Recent Labs Lab 06/12/15 2045 06/13/15 0240 06/13/15 0850  TROPONINI 0.03 0.04* <0.03   CBG:  Recent Labs Lab 06/16/15 1614  GLUCAP 248*   Results for ATHINA, FAHEY (MRN [id number]) as of 06/18/2015 09:27  Ref. Range 06/18/2015 04:25  Iron Latest Ref Range: 28-170 ug/dL 63  UIBC Latest Units: ug/dL 595  TIBC Latest Ref Range: 250-450 ug/dL 638 (L)  Saturation Ratios Latest Ref Range:  10.4-31.8 % 30  Ferritin Latest Ref Range: 11-307 ng/mL 434 (H)    Studies/Results: Ct Chest Wo Contrast  06/17/2015   CLINICAL DATA:  Chest pain and shortness of breath. Diffuse airspace disease on plain films.  EXAM: CT CHEST WITHOUT CONTRAST  TECHNIQUE: Multidetector CT imaging of the chest was performed following the standard protocol without IV contrast.  COMPARISON:  06/16/2015 chest x-ray.  Chest CT 01/18/2015.  FINDINGS: There is cardiomegaly. Pacer is in place with leads in the right atrium and right ventricle. Aorta is normal caliber. No mediastinal, hilar, or axillary adenopathy.  Diffuse bilateral airspace opacities are noted sparing the periphery. Small bilateral pleural effusions are noted.  Chest wall soft tissues are unremarkable. Imaging into the upper abdomen shows no acute findings.  Mild T12 compression fracture through the superior endplate which appears to have progressed slightly since April.  IMPRESSION: Cardiomegaly. Diffuse bilateral airspace opacities with small effusions. Findings could reflect edema, infection or hemorrhage. Favor  edema/CHF.  Progressive chronic T12 compression fracture.   Electronically Signed   By: Charlett Nose M.D.   On: 06/17/2015 19:21   Medications:   . sodium chloride   Intravenous Once  . sodium chloride   Intravenous Once  . antiseptic oral rinse  7 mL Mouth Rinse BID  . carvedilol  12.5 mg Oral BID WC  . cefTAZidime (FORTAZ)  IV  1 g Intravenous Q24H  . furosemide  60 mg Intravenous BID  . hydrALAZINE  25 mg Oral 3 times per day  . methylPREDNISolone (SOLU-MEDROL) injection  60 mg Intravenous Q12H  . metolazone  5 mg Oral Daily  . QUEtiapine  50 mg Oral QHS  . sodium bicarbonate  650 mg Oral TID  . sodium chloride  3 mL Intravenous Q12H  . 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  Impression/Plan 1. 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 - biopsied (she thinks twice) in the past but results N/A as pt can't recall where it was done, but never treated with CTX or MMF. Missed her outpt renal followup with Dr. Marisue Humble. Urine sediment + protein, 7-10 RBC). 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 Doubtful that at this advanced stage that repeating a renal bx would be helpful. Checking complements and dsDNA as indicators of lupus activity  (pending). Agree with higher dose steroids, but more aggressive "empiric renal directed therapy" not indicated. She appears to be headed toward dialysis. Discussed with her that we should proceed with vascular access evaluation when clinically more stable. Save left arm. Vein mapping. Check PTH status (pending). Non-oliguric on lasix but creatinine still rising. No dialysis indications at this time. 2. Hyperkalemia -  Improved post kayexalate 3. Hemoptysis/bilateral pulmonary infiltrates - pulmonary has seen in followup. Dr. Vassie Loll believes hemoptysis d/t anticoagulation and doubts DAH. Plans to continue vanco and fortaz for HCAP, and agrees with continued lasix for decompensated CHF.Steroids were reduced to 60 Q12 with plans to go back to usual dosing. Anticoagulation to remain on hold for right now.   4. Metabolic acidosis - now on oral sodium bicarbonate and somewhat improved. 5. Anemia - blood loss + CKD + chronic ds. TSat 30%  adequate. Transfused 1 unit yesterday. Start weekly Aranesp.   6. Systolic heart failure - good uop with current diuretic dosing. Continue lasix 60 IV Q12H + metolazone 7. DM - per primary service 8. DVT/PE - had neg V/Q 05/23/15 for "acute" PE but has had chronic history and on eliquis up to time of current admission. Held now d/t hemoptysis.  Camille Bal, MD [facility] [phone] pager 06/18/2015, 9:28 AM

## 2015-06-19 ENCOUNTER — Inpatient Hospital Stay (HOSPITAL_COMMUNITY): Payer: Medicaid Other

## 2015-06-19 DIAGNOSIS — N189 Chronic kidney disease, unspecified: Secondary | ICD-10-CM

## 2015-06-19 LAB — RENAL FUNCTION PANEL
ALBUMIN: 2.4 g/dL — AB (ref 3.5–5.0)
ANION GAP: 15 (ref 5–15)
BUN: 126 mg/dL — ABNORMAL HIGH (ref 6–20)
CALCIUM: 8.6 mg/dL — AB (ref 8.9–10.3)
CO2: 22 mmol/L (ref 22–32)
Chloride: 99 mmol/L — ABNORMAL LOW (ref 101–111)
Creatinine, Ser: 5.28 mg/dL — ABNORMAL HIGH (ref 0.44–1.00)
GFR calc non Af Amer: 10 mL/min — ABNORMAL LOW (ref >60)
GFR, EST AFRICAN AMERICAN: 12 mL/min — AB (ref >60)
GLUCOSE: 213 mg/dL — AB (ref 65–99)
PHOSPHORUS: 7.9 mg/dL — AB (ref 2.5–4.6)
POTASSIUM: 4.6 mmol/L (ref 3.5–5.1)
SODIUM: 136 mmol/L (ref 135–145)

## 2015-06-19 LAB — PARATHYROID HORMONE, INTACT (NO CA): PTH: 226 pg/mL — AB (ref 15–65)

## 2015-06-19 LAB — CBC WITH DIFFERENTIAL/PLATELET
Basophils Absolute: 0 K/uL (ref 0.0–0.1)
Basophils Relative: 0 %
Eosinophils Absolute: 0 K/uL (ref 0.0–0.7)
Eosinophils Relative: 0 %
HCT: 27.1 % — ABNORMAL LOW (ref 36.0–46.0)
Hemoglobin: 8.7 g/dL — ABNORMAL LOW (ref 12.0–15.0)
Lymphocytes Relative: 11 %
Lymphs Abs: 1.6 K/uL (ref 0.7–4.0)
MCH: 27.9 pg (ref 26.0–34.0)
MCHC: 32.1 g/dL (ref 30.0–36.0)
MCV: 86.9 fL (ref 78.0–100.0)
Monocytes Absolute: 0.9 K/uL (ref 0.1–1.0)
Monocytes Relative: 6 %
Neutro Abs: 12 K/uL — ABNORMAL HIGH (ref 1.7–7.7)
Neutrophils Relative %: 83 %
Platelets: 462 K/uL — ABNORMAL HIGH (ref 150–400)
RBC: 3.12 MIL/uL — ABNORMAL LOW (ref 3.87–5.11)
RDW: 16.3 % — ABNORMAL HIGH (ref 11.5–15.5)
WBC: 14.5 K/uL — ABNORMAL HIGH (ref 4.0–10.5)

## 2015-06-19 LAB — COMPLEMENT, TOTAL

## 2015-06-19 LAB — C4 COMPLEMENT: Complement C4, Body Fluid: 21 mg/dL (ref 14–44)

## 2015-06-19 LAB — URINE CULTURE: Culture: NO GROWTH

## 2015-06-19 LAB — PROCALCITONIN: Procalcitonin: 5.04 ng/mL

## 2015-06-19 LAB — C3 COMPLEMENT: C3 Complement: 81 mg/dL — ABNORMAL LOW (ref 82–167)

## 2015-06-19 LAB — MAGNESIUM: Magnesium: 2.6 mg/dL — ABNORMAL HIGH (ref 1.7–2.4)

## 2015-06-19 MED ORDER — LANTHANUM CARBONATE 500 MG PO CHEW
500.0000 mg | CHEWABLE_TABLET | Freq: Three times a day (TID) | ORAL | Status: DC
  Administered 2015-06-19 – 2015-06-21 (×6): 500 mg via ORAL
  Filled 2015-06-19 (×6): qty 1

## 2015-06-19 MED ORDER — CARVEDILOL 25 MG PO TABS
25.0000 mg | ORAL_TABLET | Freq: Two times a day (BID) | ORAL | Status: DC
  Administered 2015-06-19 – 2015-06-21 (×4): 25 mg via ORAL
  Filled 2015-06-19 (×4): qty 1

## 2015-06-19 NOTE — Progress Notes (Signed)
[facility] Rounding Note Subjective:  Continues to feel better No further hemoptysis but still has some pleuritic CP Up in the chair, not wearing O2 at this time, says breathing is better Appetite OK Excellent UOP response to lasix/metolazone   Objective Vital signs in last 24 hours: Filed Vitals:   06/19/15 0500 06/19/15 0530 06/19/15 0730 06/19/15 0800  BP:  157/119 148/106   Pulse: 98   110  Temp:   98.6 F (37 C)   TempSrc:   Oral   Resp: Height:      Weight: 71.5 kg (157 lb 10.1 oz)     SpO2: 99%  98% 97%   Weight change: -0.2 kg (-7.1 oz)  Intake/Output Summary (Last 24 hours) at 06/19/15 0839 Last data filed at 06/19/15 0411  Gross per 24 hour  Intake    840 ml  Output   3075 ml  Net  -2235 ml    Physical Exam:  BP 148/106 mmHg  Pulse 110  Temp(Src) 98.6 F (37 C) (Oral)  Resp 16  Ht  (1.499 m)  Wt 71.5 kg (157 lb 10.1 oz)  BMI 31.82 kg/m2  SpO2 97%  LMP 04/27/2015 (Approximate)  Gen: Soft spoken AAF NAD Skin: no rash, cyanosis + tattoos Neck: no JVD noted ICD left ant chest Chest: Tachy S1S2 No s3 or S4 Abdomen: soft, obese, no focal tenderness Ext: Trace edema of both LE's No asterixus Neuro: alert, Ox3, no focal deficit Heme/Lymph: no bruising or LAN   Labs: Basic Metabolic Panel:  Recent Labs Lab 06/12/15 1456 06/13/15 0850 06/16/15 0850 06/17/15 0240 06/17/15 0915 06/18/15 0425 06/19/15 0610  NA 139 143 140 138 138 140 136  K 4.6 4.9 4.5 6.0* 5.7* 4.7 4.6  CL 110 115* 110 109 108 105 99*  CO2 17* 16* 18* 17* 17* 19* 22  GLUCOSE 122* 83 85 165* 125* 206* 213*  BUN 69* 73* 80* 93* 98* 111* 126*  CREATININE 4.79* 4.69* 5.01* 5.25* 5.31* 5.57* 5.28*  CALCIUM 7.9* 8.3* 8.5* 8.4* 8.8* 8.5* 8.6*  PHOS  --   --   --   --   --  8.5* 7.9*   Liver Function Tests:  Recent Labs Lab 06/13/15 0850 06/16/15 0850 06/17/15 0915 06/18/15 0425 06/19/15 0610  AST 13* 27 25  --   --   ALT 9* 15 15  --   --    ALKPHOS 50 61 49  --   --   BILITOT 0.3 0.5 0.5  --   --   PROT 5.3* 6.0* 6.5  --   --   ALBUMIN 2.3* 2.8* 2.7* 2.6* 2.4*    Recent Labs Lab 06/16/15 0850 06/17/15 0240 06/17/15 0915 06/18/15 0425 06/19/15 0610  WBC 14.3* 13.4* 12.7* 14.0* 14.5*  NEUTROABS 8.7*  --  10.3* 12.5* 12.0*  HGB 8.4* 7.1* 7.8* 8.7* 8.7*  HCT 26.8* 23.2* 24.3* 26.8* 27.1*  MCV 91.8 91.0 90.7 88.7 86.9  PLT 514* 457* 482* 428* 462*      Recent Labs Lab 06/12/15 2045 06/13/15 0240 06/13/15 0850  TROPONINI 0.03 0.04* <0.03   CBG:  Recent Labs Lab 06/16/15 1614  GLUCAP 248*   Results for Paige, Aguilar (MRN [id number]) as of 06/18/2015 09:27  Ref. Range 06/18/2015 04:25  Iron Latest Ref Range: 28-170 ug/dL 63  UIBC Latest Units: ug/dL 409  TIBC Latest Ref Range: 250-450 ug/dL 811 (L)  Saturation Ratios Latest Ref Range: 10.4-31.8 % 30  Ferritin Latest Ref Range: 11-307 ng/mL 434 (H)    Studies/Results: Ct Chest Wo Contrast  06/17/2015   CLINICAL DATA:  Chest pain and shortness of breath. Diffuse airspace disease on plain films.  EXAM: CT CHEST WITHOUT CONTRAST  TECHNIQUE: Multidetector CT imaging of the chest was performed following the standard protocol without IV contrast.  COMPARISON:  06/16/2015 chest x-ray.  Chest CT 01/18/2015.  FINDINGS: There is cardiomegaly. Pacer is in place with leads in the right atrium and right ventricle. Aorta is normal caliber. No mediastinal, hilar, or axillary adenopathy.  Diffuse bilateral airspace opacities are noted sparing the periphery. Small bilateral pleural effusions are noted.  Chest wall soft tissues are unremarkable. Imaging into the upper abdomen shows no acute findings.  Mild T12 compression fracture through the superior endplate which appears to have progressed slightly since April.  IMPRESSION: Cardiomegaly. Diffuse bilateral airspace opacities with small effusions. Findings could reflect edema, infection or hemorrhage. Favor edema/CHF.   Progressive chronic T12 compression fracture.   Electronically Signed   By: Charlett Nose M.D.   On: 06/17/2015 19:21   Medications:   . sodium chloride   Intravenous Once  . sodium chloride   Intravenous Once  . antiseptic oral rinse  7 mL Mouth Rinse BID  . carvedilol  12.5 mg Oral BID WC  . cefTAZidime (FORTAZ)  IV  1 g Intravenous Q24H  . darbepoetin (ARANESP) injection - NON-DIALYSIS  100 mcg Subcutaneous Q Sat-1800  . furosemide  60 mg Intravenous BID  . hydrALAZINE  35 mg Oral 3 times per day  . methylPREDNISolone (SOLU-MEDROL) injection  60 mg Intravenous Q12H  . metolazone  5 mg Oral Daily  . QUEtiapine  50 mg Oral QHS  . sodium bicarbonate  650 mg Oral TID  . sodium chloride  3 mL Intravenous Q12H  . 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  Impression/Plan  1. 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 - biopsied (she thinks twice) in the past but results N/A as pt can't recall where it was done, but never treated with CTX or MMF. Missed her outpt renal followup with Dr. Marisue Humble. Urine sediment + protein, 7-10 RBC). 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 Doubtful that at this advanced stage that repeating a renal bx would be helpful. Checking complements and dsDNA as indicators of lupus activity  (pending). Agree with steroids, but more aggressive "empiric renal directed therapy" not indicated. She appears to be headed toward dialysis (not necessarily this admission). Discussed with her that we should proceed with vascular access evaluation when clinically more stable. Save left arm. Vein mapping ordered.  Will consult [facility] on Monday for access  evaluation. Non-oliguric on diuretics. Creatinine is actually a little better today, BUN higher mostly d/t steroids. No dialysis indications at this time.   2. Hyperkalemia - Resolved  3. CKD-MBD - PTH pending. Add fosrenol as phos binder.   4. Hypertension - with tachycardia. Increase carvedilol to 25 BID  5. Hemoptysis/bilateral pulmonary infiltrates - pulmonary has not seen yet today. Yesterday Dr. Vassie Loll indicated that he felt  hemoptysis d/t anticoagulation and doubted DAH. Remains on vanco and fortaz for HCAP,  and lasix/metolazone for decompensated CHF. Methyprednisolone is now at 60 Q12 with plans to taper back to usual dosing. Anticoagulation is still on hold. Repeat CXR today.       6. Metabolic acidosis - improving on oral sodium bicarbonate  7. Anemia - blood loss + CKD + chronic ds. TSat 30% adequate. Transfused 1 unit 9/23. Started weekly Aranesp 100 on 9/24 .    8. Systolic heart failure - good uop with current diuretic dosing. Continue lasix 60 IV Q12H + metolazone.  Hope can back down to po dosing soon. LVEF 20-25% ECHO this admission.  9. 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 Decision to resume anticoagulation per primary team/pulmonary team   Camille Bal, MD [facility] [phone] pager 06/19/2015, 8:39 AM

## 2015-06-19 NOTE — Progress Notes (Signed)
[place] TEAM 1 - Stepdown/ICU TEAM Progress Note  Paige Aguilar JWJ:[id number] DOB: Jan 30, 1986 DOA: 06/16/2015 PCP: [name], MD  Admit HPI / Brief Narrative: Paige Aguilar is a 30 y.o. 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   The patient was recently hospitalized 4 days ago with chest pain and green sputum production. She was discharged to days ago on Omnicef to complete her antibiotic regimen. Additionally, she will was recently diagnosed in the beginning of August with a DVT. The patient did have chest pain at that time and was presumed to have a PE, although a VQ scan was negative. The patient awoke this morning acutely short of breath, which was worse with laying down and activity and improved with sitting up and rest. Her breathing worsened while at home. EMS was called the patient was found to be restless and using accessory muscles with labored breathing. While in the emergency department, the patient did have several episodes of hemoptysis which totaled approximately 50 mL's.   HPI/Subjective: 9/25 A/O 4, patient states was in ED on Sunday with URI symptoms was discharged home on antibiotics and medication for symptom management. However positive hemoptysis starting yesterday. Would have multiple coughing episodes in which she would cough up ~table spoon full of blood. In addition patient states has been seen a nephrologist in town secondary to her worsening kidney function. Upon admission was on 20 mg prednisone chronically (lupus nephritis?). Currently patient resting comfortably in bed.   Assessment/Plan: acute respiratory failure with hypoxemia/HCAP -Improved on nasal cannula titrate O2 to assure SPO2> 93% - Hemoptysis/Diffuse Alveolar Hemorrhage (DAH) -PCXR: PNA vs Diffuse Alveolar  Hemorrhage.  After reviewing PCXR I favor Diffuse Alveolar Hemorrhage. -Contacted PCCM Dr. Tyson Alias and Molli Knock who reviewed the CXR and concur with high likelihood of Diffuse Alveolar Hemorrhage with possible underlying HCAP . CHF and MCTD (lupus) may also be contributing factor. -DC'd Eliquis; given Factor VIII Inhibitor Bypassing Activity (FEIBA) in ED - pharmacy to monitor PTT -Hold all anticoagulation agents to include aspirin -Chest CT; multifocal PNA vs DAH vs pulmonary edema (PCCM does not believe DAH) -MCTD workup pending  Acute on chronic combined systolic and diastolic heart failure -Transfuse for Hb< 8 -9/23 Transfuse 1 unit PRBC  -BNP significantly elevated from patient's baseline at 2400 -Strict I's and O's since admission;- 7.2 L -Continue Lasix 60 mg IV twice daily for diuresis -Echocardiogram; see results below  HTN  -Continue Coreg 12.5 mg BID -Metoprolol IV 5 mg PRN SBP> 150 or DBP>100 or HR >100 -Continue Lasix 60 mg BID -Increase Hydralazine 35 mg TID; will help with pulmonary hypertension -Continue Zaroxolyn 5 mg daily  Pulmonary hypertension -See acute on chronic CHF  Acute renal failure superimposed on stage IV chronic kidney disease -Most likely multifactorial to include lupus nephritis, CHF,+/-vasculitis. Appropriate serologies ordered and pending -Continue diuresis per nephrology  - Avoid nephrotoxic drugs -Vein mapping on Monday; patient most likely will require dialysis in the near future -Cr slightly improved today  SLE/MCTD -Pt Hm dose Prednisone 20mg  Daily  -Secondary to patient's possible DAH increase methylprednisone 60 mg IV QID  -MCTD panel pending  DVT/PE -See hemoptysis    Code Status: FULL Family Communication: no family present at time of exam Disposition Plan: Resolution of DAH?, Lupus nephritis?    Consultants: Dr.Wesam Molli Knock [facility] M) Dr.Cynthia Eliott Nine (nephrology)    Procedure/Significant  Events: 9/23 Echocardiogram; - Left  ventricle: mild concentric hypertrophy. -LVEF=20%- 25%. -Doppler parameters c/wrestrictive physiology, -Findings C/W RV volume and pressure overload. - Mitral valve: moderate regurgitation. -Tricuspid valve: severe regurgitation. - Pulmonary arteries:  PApeak pressure: 70 mm Hg (S). -9/23 Transfuse 1 unit PRBC  9/23 CT chest without contrast;-Cardiomegaly.- Diffuse bilateral airspace opacities with small effusions. Edema, vs Infx vs  Hemorrhage vs edema/CHF. -Progressive chronic T12 compression fracture.    Culture 9/22 blood left arm 2 NGTD 9/22 urine positive multiple species 9/22 strep pneumo/Legionella urine antigen negative 9/23 urine pending 9/23 sputum pending   Antibiotics: Aztreonam 9/22>> stopped 9/23 Ceftazidime 9/22>> Vancomycin 9/22>>     DVT prophylaxis: SCD   Devices   LINES / TUBES:      Continuous Infusions:   Objective: VITAL SIGNS: Temp: 98.3 F (36.8 C) (09/25 1130) Temp Source: Oral (09/25 1130) BP: 133/96 mmHg (09/25 1130) Pulse Rate: 110 (09/25 0800) SPO2; FIO2:   Intake/Output Summary (Last 24 hours) at 06/19/15 1441 Last data filed at 06/19/15 0411  Gross per 24 hour  Intake    640 ml  Output   1925 ml  Net  -1285 ml     Exam: General: A/O 4, negative acute respiratory distress, negative hemoptysis Eyes: Negative headache, negative scleral hemorrhage ENT: Negative Runny nose, negative gingival bleeding, Neck:  Negative scars, masses, torticollis, lymphadenopathy, JVD Lungs: diffuse poor air movement, negative wheezes, bibasilar crackles Cardiovascular: Tachycardic, Regular rhythm without murmur gallop or rub normal S1 and S2 Abdomen:negative abdominal pain, nondistended, positive soft, bowel sounds, no rebound, no ascites, no appreciable mass Extremities: No significant cyanosis, clubbing, or edema bilateral lower extremities Psychiatric:  Negative depression, negative anxiety, negative fatigue, negative mania    Neurologic:  Cranial nerves II through XII intact, tongue/uvula midline, all extremities muscle strength 5/5, sensation intact throughout,  negative dysarthria, negative expressive aphasia, negative receptive aphasia.   Data Reviewed: Basic Metabolic Panel:  Recent Labs Lab 06/16/15 0850 06/17/15 0240 06/17/15 0915 06/18/15 0425 06/19/15 0610  NA 140 138 138 140 136  K 4.5 6.0* 5.7* 4.7 4.6  CL 110 109 108 105 99*  CO2 18* 17* 17* 19* 22  GLUCOSE 85 165* 125* 206* 213*  BUN 80* 93* 98* 111* 126*  CREATININE 5.01* 5.25* 5.31* 5.57* 5.28*  CALCIUM 8.5* 8.4* 8.8* 8.5* 8.6*  MG  --   --  1.6* 2.7* 2.6*  PHOS  --   --   --  8.5* 7.9*   Liver Function Tests:  Recent Labs Lab 06/12/15 1456 06/13/15 0850 06/16/15 0850 06/17/15 0915 06/18/15 0425 06/19/15 0610  AST 17 13* 27 25  --   --   ALT 11* 9* 15 15  --   --   ALKPHOS 65 50 61 49  --   --   BILITOT 0.4 0.3 0.5 0.5  --   --   PROT 6.0* 5.3* 6.0* 6.5  --   --   ALBUMIN 2.6* 2.3* 2.8* 2.7* 2.6* 2.4*   No results for input(s): LIPASE, AMYLASE in the last 168 hours. No results for input(s): AMMONIA in the last 168 hours. CBC:  Recent Labs Lab 06/13/15 0850  06/16/15 0850 06/17/15 0240 06/17/15 0915 06/18/15 0425 06/19/15 0610  WBC 9.4  < > 14.3* 13.4* 12.7* 14.0* 14.5*  NEUTROABS 5.8  --  8.7*  --  10.3* 12.5* 12.0*  HGB 7.0*  < > 8.4* 7.1* 7.8* 8.7* 8.7*  HCT 22.6*  < > 26.8* 23.2* 24.3* 26.8* 27.1*  MCV 90.8  < > 91.8 91.0 90.7 88.7 86.9  PLT 445*  < > 514* 457* 482* 428* 462*  < > = values in this interval not displayed. Cardiac Enzymes:  Recent Labs Lab 06/12/15 2045 06/13/15 0240 06/13/15 0850  TROPONINI 0.03 0.04* <0.03   BNP (last 3 results)  Recent Labs  05/23/15 1450 06/12/15 1456 06/16/15 0850  BNP 749.0* 915.5* 2425.6*    ProBNP (last 3 results) No results for input(s): PROBNP in the last 8760 hours.  CBG:  Recent Labs Lab 06/16/15 1614  GLUCAP 248*    Recent Results (from  the past 240 hour(s))  Urine culture     Status: None   Collection Time: 06/12/15  4:49 PM  Result Value Ref Range Status   Specimen Description URINE, CLEAN CATCH  Final   Special Requests NONE  Final   Culture >=100,000 COLONIES/mL ESCHERICHIA COLI  Final   Report Status 06/15/2015 FINAL  Final   Organism ID, Bacteria ESCHERICHIA COLI  Final      Susceptibility   Escherichia coli - MIC*    AMPICILLIN >=32 RESISTANT Resistant     CEFAZOLIN <=4 SENSITIVE Sensitive     CEFTRIAXONE <=1 SENSITIVE Sensitive     CIPROFLOXACIN <=0.25 SENSITIVE Sensitive     GENTAMICIN <=1 SENSITIVE Sensitive     IMIPENEM <=0.25 SENSITIVE Sensitive     NITROFURANTOIN <=16 SENSITIVE Sensitive     TRIMETH/SULFA <=20 SENSITIVE Sensitive     AMPICILLIN/SULBACTAM 8 SENSITIVE Sensitive     PIP/TAZO <=4 SENSITIVE Sensitive     * >=100,000 COLONIES/mL ESCHERICHIA COLI  MRSA PCR Screening     Status: None   Collection Time: 06/12/15 10:43 PM  Result Value Ref Range Status   MRSA by PCR NEGATIVE NEGATIVE Final    Comment:        The GeneXpert MRSA Assay (FDA approved for NASAL specimens only), is one component of a comprehensive MRSA colonization surveillance program. It is not intended to diagnose MRSA infection nor to guide or monitor treatment for MRSA infections.   Blood culture (routine x 2)     Status: None (Preliminary result)   Collection Time: 06/16/15 10:15 AM  Result Value Ref Range Status   Specimen Description BLOOD LEFT ARM  Final   Special Requests BOTTLES DRAWN AEROBIC AND ANAEROBIC 5CC  Final   Culture NO GROWTH 3 DAYS  Final   Report Status PENDING  Incomplete  Blood culture (routine x 2)     Status: None (Preliminary result)   Collection Time: 06/16/15 10:39 AM  Result Value Ref Range Status   Specimen Description BLOOD LEFT ARM  Final   Special Requests BOTTLES DRAWN AEROBIC AND ANAEROBIC 3CC  Final   Culture NO GROWTH 3 DAYS  Final   Report Status PENDING  Incomplete  Urine  culture     Status: None   Collection Time: 06/16/15 11:37 AM  Result Value Ref Range Status   Specimen Description URINE, CLEAN CATCH  Final   Special Requests NONE  Final   Culture MULTIPLE SPECIES PRESENT, SUGGEST RECOLLECTION  Final   Report Status 06/17/2015 FINAL  Final  Culture, Urine     Status: None   Collection Time: 06/18/15  4:33 AM  Result Value Ref Range Status   Specimen Description URINE, CLEAN CATCH  Final   Special Requests NONE  Final   Culture NO GROWTH 1 DAY  Final   Report Status 06/19/2015 FINAL  Final     Studies:  Recent x-ray studies have been reviewed in detail by the Attending Physician  Scheduled Meds:  Scheduled Meds: . sodium chloride   Intravenous Once  . sodium chloride   Intravenous Once  . antiseptic oral rinse  7 mL Mouth Rinse BID  . carvedilol  25 mg Oral BID WC  . cefTAZidime (FORTAZ)  IV  1 g Intravenous Q24H  . darbepoetin (ARANESP) injection - NON-DIALYSIS  100 mcg Subcutaneous Q Sat-1800  . furosemide  60 mg Intravenous BID  . hydrALAZINE  35 mg Oral 3 times per day  . lanthanum  500 mg Oral TID WC  . methylPREDNISolone (SOLU-MEDROL) injection  60 mg Intravenous Q12H  . metolazone  5 mg Oral Daily  . QUEtiapine  50 mg Oral QHS  . sodium bicarbonate  650 mg Oral TID  . sodium chloride  3 mL Intravenous Q12H  . vancomycin  750 mg Intravenous Q24H    Time spent on care of this patient: 40 mins   WOODS, Roselind Messier , MD  [facility] Office  [phone] Pager - [phone]  On-Call/Text Page:      [email or web address]      password [email or web address]  If 7PM-7AM, please contact night-coverage [email or web address] Password [email or web address] 06/19/2015, 2:41 PM   LOS: 3 days   Care during the described time interval was provided by me .  I have reviewed this patient's available data, including medical history, events of note, physical examination, and all test results as part of my evaluation. I have personally reviewed and interpreted all radiology  studies.   Carolyne Littles, MD [phone] Pager

## 2015-06-19 NOTE — Progress Notes (Addendum)
ANTIBIOTIC CONSULT NOTE - Follow Up  Pharmacy Consult for Ceftazidime + Vancomcyin  Indication: rule out pneumonia  Allergies  Allergen Reactions  . Amoxicillin Rash  . Ciprofloxacin Itching and Rash  . Sulfa Antibiotics Rash    Patient Measurements: Height:  (149.9 cm) Weight: 157 lb 10.1 oz (71.5 kg) IBW/kg (Calculated) : 43.2 Adjusted Body Weight:   Vital Signs: Temp: 98.6 F (37 C) (09/25 0730) Temp Source: Oral (09/25 0730) BP: 148/106 mmHg (09/25 0730) Pulse Rate: 110 (09/25 0800) Intake/Output from previous day: 09/24 0701 - 09/25 0700 In: 1040 [P.O.:840; IV Piggyback:200] Out: 3075 [Urine:3075] Intake/Output from this shift:    Labs:  Recent Labs  06/17/15 0915 06/18/15 0425 06/19/15 0610  WBC 12.7* 14.0* 14.5*  HGB 7.8* 8.7* 8.7*  PLT 482* 428* 462*  CREATININE 5.31* 5.57* 5.28*   Estimated Creatinine Clearance: 13.4 mL/min (by C-G formula based on Cr of 5.28). No results for input(s): VANCOTROUGH, VANCOPEAK, VANCORANDOM, GENTTROUGH, GENTPEAK, GENTRANDOM, TOBRATROUGH, TOBRAPEAK, TOBRARND, AMIKACINPEAK, AMIKACINTROU, AMIKACIN in the last 72 hours.   Microbiology: Recent Results (from the past 720 hour(s))  Urine culture     Status: None   Collection Time: 06/12/15  4:49 PM  Result Value Ref Range Status   Specimen Description URINE, CLEAN CATCH  Final   Special Requests NONE  Final   Culture >=100,000 COLONIES/mL ESCHERICHIA COLI  Final   Report Status 06/15/2015 FINAL  Final   Organism ID, Bacteria ESCHERICHIA COLI  Final      Susceptibility   Escherichia coli - MIC*    AMPICILLIN >=32 RESISTANT Resistant     CEFAZOLIN <=4 SENSITIVE Sensitive     CEFTRIAXONE <=1 SENSITIVE Sensitive     CIPROFLOXACIN <=0.25 SENSITIVE Sensitive     GENTAMICIN <=1 SENSITIVE Sensitive     IMIPENEM <=0.25 SENSITIVE Sensitive     NITROFURANTOIN <=16 SENSITIVE Sensitive     TRIMETH/SULFA <=20 SENSITIVE Sensitive     AMPICILLIN/SULBACTAM 8 SENSITIVE Sensitive      PIP/TAZO <=4 SENSITIVE Sensitive     * >=100,000 COLONIES/mL ESCHERICHIA COLI  MRSA PCR Screening     Status: None   Collection Time: 06/12/15 10:43 PM  Result Value Ref Range Status   MRSA by PCR NEGATIVE NEGATIVE Final    Comment:        The GeneXpert MRSA Assay (FDA approved for NASAL specimens only), is one component of a comprehensive MRSA colonization surveillance program. It is not intended to diagnose MRSA infection nor to guide or monitor treatment for MRSA infections.   Blood culture (routine x 2)     Status: None (Preliminary result)   Collection Time: 06/16/15 10:15 AM  Result Value Ref Range Status   Specimen Description BLOOD LEFT ARM  Final   Special Requests BOTTLES DRAWN AEROBIC AND ANAEROBIC 5CC  Final   Culture NO GROWTH 2 DAYS  Final   Report Status PENDING  Incomplete  Blood culture (routine x 2)     Status: None (Preliminary result)   Collection Time: 06/16/15 10:39 AM  Result Value Ref Range Status   Specimen Description BLOOD LEFT ARM  Final   Special Requests BOTTLES DRAWN AEROBIC AND ANAEROBIC 3CC  Final   Culture NO GROWTH 2 DAYS  Final   Report Status PENDING  Incomplete  Urine culture     Status: None   Collection Time: 06/16/15 11:37 AM  Result Value Ref Range Status   Specimen Description URINE, CLEAN CATCH  Final   Special Requests NONE  Final   Culture MULTIPLE SPECIES PRESENT, SUGGEST RECOLLECTION  Final   Report Status 06/17/2015 FINAL  Final    Medical History: Past Medical History  Diagnosis Date  . Lupus   . Arthritis   . Thyroid disease   . Diabetes mellitus without complication   . PE (pulmonary embolism)   . DVT (deep venous thrombosis)     Medications:  Anti-infectives    Start     Dose/Rate Route Frequency Ordered Stop   06/17/15 1600  vancomycin (VANCOCIN) IVPB 750 mg/150 ml premix     750 mg 150 mL/hr over 60 Minutes Intravenous Every 24 hours 06/16/15 1317     06/16/15 1600  cefTAZidime (FORTAZ) 1 g in dextrose  5 % 50 mL IVPB     1 g 100 mL/hr over 30 Minutes Intravenous Every 24 hours 06/16/15 1317     06/16/15 1400  aztreonam (AZACTAM) 1 g in dextrose 5 % 50 mL IVPB  Status:  Discontinued     1 g 100 mL/hr over 30 Minutes Intravenous 3 times per day 06/16/15 1317 06/17/15 2034   06/16/15 1000  vancomycin (VANCOCIN) 1,500 mg in sodium chloride 0.9 % 500 mL IVPB     1,500 mg 250 mL/hr over 120 Minutes Intravenous  Once 06/16/15 0949 06/16/15 1244   06/16/15 1000  aztreonam (AZACTAM) 2 g in dextrose 5 % 50 mL IVPB     2 g 100 mL/hr over 30 Minutes Intravenous  Once 06/16/15 0951 06/16/15 1115     Assessment: 28 yof with h/o severe lupus chronically on steroids,  presented to the ED with SOB on 06/16/15. Cxr showed worsening pna, bedside ECHO neg for RV strain - as hypercoagulable state with recent DVT / PE.  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 Apixaban was reversed with Kcentra H/H today low stable 8/26 and pltc 500.  Bleeding resolved. RN today reports no signs of bleeding noted.   On 9/22 she was started on empiric aztreonam/vancomycin for possible HCAP. Aztreonam discontinued on 9/23.  Afebrile and WBC remains elevated at 14.5  Scr elevated above baseline at 5.01 with hx CKD (Baseline 3.5). Scr 5.57>>today 5.28.  I/O 1040/3075  Vanc loaded then  q24h 9/22>> Ceftazidime 9/22> Aztreo 9/22>>9/23   9/22 Blood x 2 ngtd 9/22 Urine Multiple Species/neg/final 9/24 Urine sent   Goal of Therapy:  Eradication of infection VT 15-20  Plan:  Continue ceftazidime 1gm q24h Continue vancomycin  q24h F/u renal fxn, C&S, clinical status VT tomorrow 9/26 @ 17:30    Noah Delaine, RPh Clinical Pharmacist Pager: [phone] 06/19/2015 11:33 AM

## 2015-06-20 ENCOUNTER — Encounter (HOSPITAL_COMMUNITY): Payer: Medicaid Other

## 2015-06-20 DIAGNOSIS — M329 Systemic lupus erythematosus, unspecified: Secondary | ICD-10-CM

## 2015-06-20 LAB — RENAL FUNCTION PANEL
ANION GAP: 15 (ref 5–15)
Albumin: 2.5 g/dL — ABNORMAL LOW (ref 3.5–5.0)
BUN: 132 mg/dL — ABNORMAL HIGH (ref 6–20)
CO2: 23 mmol/L (ref 22–32)
Calcium: 8.3 mg/dL — ABNORMAL LOW (ref 8.9–10.3)
Chloride: 93 mmol/L — ABNORMAL LOW (ref 101–111)
Creatinine, Ser: 5.16 mg/dL — ABNORMAL HIGH (ref 0.44–1.00)
GFR calc Af Amer: 12 mL/min — ABNORMAL LOW (ref >60)
GFR calc non Af Amer: 10 mL/min — ABNORMAL LOW (ref >60)
GLUCOSE: 412 mg/dL — AB (ref 65–99)
POTASSIUM: 4.3 mmol/L (ref 3.5–5.1)
Phosphorus: 7.1 mg/dL — ABNORMAL HIGH (ref 2.5–4.6)
SODIUM: 131 mmol/L — AB (ref 135–145)

## 2015-06-20 LAB — MAGNESIUM: Magnesium: 2.1 mg/dL (ref 1.7–2.4)

## 2015-06-20 LAB — GLOMERULAR BASEMENT MEMBRANE ANTIBODIES: GBM Ab: 4 units (ref 0–20)

## 2015-06-20 LAB — CBC WITH DIFFERENTIAL/PLATELET
BASOS PCT: 0 %
Basophils Absolute: 0 10*3/uL (ref 0.0–0.1)
Eosinophils Absolute: 0 10*3/uL (ref 0.0–0.7)
Eosinophils Relative: 0 %
HEMATOCRIT: 26.9 % — AB (ref 36.0–46.0)
Hemoglobin: 8.8 g/dL — ABNORMAL LOW (ref 12.0–15.0)
Lymphocytes Relative: 8 %
Lymphs Abs: 1.1 10*3/uL (ref 0.7–4.0)
MCH: 28.8 pg (ref 26.0–34.0)
MCHC: 32.7 g/dL (ref 30.0–36.0)
MCV: 87.9 fL (ref 78.0–100.0)
MONO ABS: 0.4 10*3/uL (ref 0.1–1.0)
MONOS PCT: 3 %
NEUTROS ABS: 11.7 10*3/uL — AB (ref 1.7–7.7)
Neutrophils Relative %: 89 %
Platelets: 444 10*3/uL — ABNORMAL HIGH (ref 150–400)
RBC: 3.06 MIL/uL — ABNORMAL LOW (ref 3.87–5.11)
RDW: 16 % — AB (ref 11.5–15.5)
WBC: 13.2 10*3/uL — ABNORMAL HIGH (ref 4.0–10.5)

## 2015-06-20 LAB — ANTINUCLEAR ANTIBODIES, IFA: ANA Ab, IFA: NEGATIVE

## 2015-06-20 LAB — SJOGRENS SYNDROME-B EXTRACTABLE NUCLEAR ANTIBODY: SSB (La) (ENA) Antibody, IgG: <0.2 AI (ref 0.0–0.9)

## 2015-06-20 LAB — ANTI-DNA ANTIBODY, DOUBLE-STRANDED: DS DNA AB: 53 [IU]/mL — AB (ref 0–9)

## 2015-06-20 LAB — HEPARIN LEVEL (UNFRACTIONATED): Heparin Unfractionated: 0.47 IU/mL (ref 0.30–0.70)

## 2015-06-20 LAB — MPO/PR-3 (ANCA) ANTIBODIES

## 2015-06-20 LAB — SJOGRENS SYNDROME-A EXTRACTABLE NUCLEAR ANTIBODY: SSA (Ro) (ENA) Antibody, IgG: 0.2 AI (ref 0.0–0.9)

## 2015-06-20 LAB — PROCALCITONIN: Procalcitonin: 2.91 ng/mL

## 2015-06-20 LAB — GLUCOSE, CAPILLARY: Glucose-Capillary: 229 mg/dL — ABNORMAL HIGH (ref 65–99)

## 2015-06-20 LAB — ANTI-DNASE B ANTIBODY

## 2015-06-20 LAB — ANTI-SCLERODERMA ANTIBODY: Scleroderma (Scl-70) (ENA) Antibody, IgG: 0.5 AI (ref 0.0–0.9)

## 2015-06-20 MED ORDER — ALPRAZOLAM 0.25 MG PO TABS
0.2500 mg | ORAL_TABLET | Freq: Three times a day (TID) | ORAL | Status: DC | PRN
  Administered 2015-06-20 – 2015-06-21 (×2): 0.25 mg via ORAL
  Filled 2015-06-20 (×2): qty 1

## 2015-06-20 MED ORDER — INSULIN GLARGINE 100 UNIT/ML ~~LOC~~ SOLN
14.0000 [IU] | Freq: Every day | SUBCUTANEOUS | Status: DC
  Administered 2015-06-20: 14 [IU] via SUBCUTANEOUS
  Filled 2015-06-20 (×2): qty 0.14

## 2015-06-20 MED ORDER — HEPARIN (PORCINE) IN NACL 100-0.45 UNIT/ML-% IJ SOLN
1000.0000 [IU]/h | INTRAMUSCULAR | Status: DC
  Administered 2015-06-20: 1000 [IU]/h via INTRAVENOUS
  Filled 2015-06-20 (×2): qty 250

## 2015-06-20 MED ORDER — CEFUROXIME AXETIL 500 MG PO TABS
500.0000 mg | ORAL_TABLET | Freq: Two times a day (BID) | ORAL | Status: DC
  Administered 2015-06-20 – 2015-06-21 (×2): 500 mg via ORAL
  Filled 2015-06-20 (×3): qty 1

## 2015-06-20 MED ORDER — INSULIN ASPART 100 UNIT/ML ~~LOC~~ SOLN
0.0000 [IU] | Freq: Three times a day (TID) | SUBCUTANEOUS | Status: DC
  Administered 2015-06-21: 2 [IU] via SUBCUTANEOUS

## 2015-06-20 MED ORDER — PREDNISONE 20 MG PO TABS
40.0000 mg | ORAL_TABLET | Freq: Every day | ORAL | Status: DC
  Administered 2015-06-21: 40 mg via ORAL
  Filled 2015-06-20: qty 4

## 2015-06-20 MED ORDER — INSULIN ASPART 100 UNIT/ML ~~LOC~~ SOLN
0.0000 [IU] | Freq: Every day | SUBCUTANEOUS | Status: DC
  Administered 2015-06-20: 3 [IU] via SUBCUTANEOUS

## 2015-06-20 NOTE — Progress Notes (Signed)
[facility] TEAM 1 - Stepdown/ICU TEAM PROGRESS NOTE  Paige Aguilar ZOX:[id number] DOB: January 07, 1986 DOA: 06/16/2015 PCP: [name], MD  Admit HPI / Brief Narrative: 29 y.o. F Hx Lupus, DVT and presumed PE on Eliquis, diet-controlled Diabetes, Systolic and Diastolic CHF with a ejection fraction of 25-30% with diffuse hypokinesis and grade 1 diastolic dysfunction on 05/01/2015 who was hospitalized 4 days prior to this admit with chest pain and green sputum production. She was discharged on Omnicef.  The patient awoke acutely short of breath, which was worse with laying down and activity and improved with sitting up and rest. EMS was called:  the patient was found to be restless and using accessory muscles with labored breathing. While in the emergency department, the patient did have several episodes of hemoptysis which totaled approximately 50 mL's.  HPI/Subjective: Resting comfortably in bed.  No complaints.  Does not like the food served here.  Anxious to go home, but willing to stay to have vein mapping and Vasc Surgery evaluation.    Assessment/Plan:  Acute respiratory failure with hypoxemia / HCAP -Improved on nasal cannula titrate O2 to assure SPO2> 93%  Hemoptysis / Diffuse Alveolar Hemorrhage -PCXR: PNA vs Diffuse AlveolarHemorrhage -Contacted PCCM Dr. Tyson Alias and Molli Knock who reviewed the CXR and concur with high likelihood of Diffuse Alveolar Hemorrhage with possible underlying HCAP . CHF and MCTD (lupus) may also be contributing factor. 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 Appropriate serologies ordered and pending -Continue diuresis per nephrology  -Avoid nephrotoxic drugs -Vein mapping today; patient most likely will require dialysis in the near future -care per Nephrology  SLE -home dose Prednisone  Daily  -MCTD panel pending  DVT/PE -resume anticoag as suggested by Pulm   Code Status: FULL Family Communication: no family present at time of exam Disposition Plan: stable for transfer to renal bed - for vein mapping and possible HD access surgery prior to d/c home   Consultants: PCCM Nephrology  Procedures: 9/23 TTE EF=20%- 25%. -Doppler parameters c/wrestrictive physiology -Findings C/W RV volume and pressure overload - Mitral valve: moderate regurgitation -Tricuspid valve: severe regurgitation. - Pulmonary arteries: PApeak pressure: 70 mm Hg (S).  Antibiotics: Ceftazidime 9/22 > 9/25 Vancomycin 9/22 > 9/25 Ceftin 9/26 >  DVT prophylaxis: IV heparin  Objective: Blood pressure 135/90, pulse 90, temperature 98.4 F (36.9 C), temperature source Oral, resp. rate 13, height  (1.499 m), weight 70.761 kg (156 lb), last menstrual period 04/27/2015, SpO2 96 %.  Intake/Output Summary (Last 24 hours) at 06/20/15 1420 Last data filed at 06/20/15 0800  Gross per 24 hour  Intake    560 ml  Output   2650 ml  Net  -2090 ml    Exam: General: No acute respiratory distress Lungs: Clear to auscultation bilaterally without wheezes or crackles Cardiovascular: Regular rate and rhythm without murmur gallop or rub normal S1 and S2 Abdomen: Nontender, nondistended, soft, bowel sounds positive, no rebound, no ascites, no appreciable mass  Extremities: No significant cyanosis, clubbing, or edema bilateral lower extremities  Data Reviewed: Basic Metabolic Panel:  Recent  Labs Lab 06/17/15 0240 06/17/15 0915 06/18/15 0425 06/19/15 0610 06/20/15 0224  NA 138 138 140 136 131*  K 6.0* 5.7* 4.7 4.6 4.3  CL 109 108 105 99* 93*  CO2 17* 17* 19* 22 23  GLUCOSE 165* 125* 206* 213* 412*  BUN 93* 98* 111* 126* 132*  CREATININE 5.25* 5.31* 5.57* 5.28* 5.16*  CALCIUM 8.4* 8.8* 8.5* 8.6* 8.3*  MG  --  1.6* 2.7* 2.6* 2.1  PHOS  --   --  8.5* 7.9* 7.1*    CBC:  Recent Labs Lab 06/16/15 0850 06/17/15 0240 06/17/15 0915 06/18/15 0425 06/19/15 0610 06/20/15 0224  WBC 14.3* 13.4* 12.7* 14.0* 14.5* 13.2*  NEUTROABS 8.7*  --  10.3* 12.5* 12.0* 11.7*  HGB 8.4* 7.1* 7.8* 8.7* 8.7* 8.8*  HCT 26.8* 23.2* 24.3* 26.8* 27.1* 26.9*  MCV 91.8 91.0 90.7 88.7 86.9 87.9  PLT 514* 457* 482* 428* 462* 444*    Liver Function Tests:  Recent Labs Lab 06/16/15 0850 06/17/15 0915 06/18/15 0425 06/19/15 0610 06/20/15 0224  AST 27 25  --   --   --   ALT 15 15  --   --   --   ALKPHOS 61 49  --   --   --   BILITOT 0.5 0.5  --   --   --   PROT 6.0* 6.5  --   --   --   ALBUMIN 2.8* 2.7* 2.6* 2.4* 2.5*    Coags:  Recent Labs Lab 06/16/15 0850  INR 1.50*    Recent Labs Lab 06/16/15 0850  APTT 31   CBG:  Recent Labs Lab 06/16/15 1614  GLUCAP 248*    Recent Results (from the past 240 hour(s))  Urine culture     Status: None   Collection Time: 06/12/15  4:49 PM  Result Value Ref Range Status   Specimen Description URINE, CLEAN CATCH  Final   Special Requests NONE  Final   Culture >=100,000 COLONIES/mL ESCHERICHIA COLI  Final   Report Status 06/15/2015 FINAL  Final   Organism ID, Bacteria ESCHERICHIA COLI  Final      Susceptibility   Escherichia coli - MIC*    AMPICILLIN >=32 RESISTANT Resistant     CEFAZOLIN <=4 SENSITIVE Sensitive     CEFTRIAXONE <=1 SENSITIVE Sensitive     CIPROFLOXACIN <=0.25 SENSITIVE Sensitive     GENTAMICIN <=1 SENSITIVE Sensitive     IMIPENEM <=0.25 SENSITIVE Sensitive     NITROFURANTOIN <=16 SENSITIVE Sensitive      TRIMETH/SULFA <=20 SENSITIVE Sensitive     AMPICILLIN/SULBACTAM 8 SENSITIVE Sensitive     PIP/TAZO <=4 SENSITIVE Sensitive     * >=100,000 COLONIES/mL ESCHERICHIA COLI  MRSA PCR Screening     Status: None   Collection Time: 06/12/15 10:43 PM  Result Value Ref Range Status   MRSA by PCR NEGATIVE NEGATIVE Final    Comment:        The GeneXpert MRSA Assay (FDA approved for NASAL specimens only), is one component of a comprehensive MRSA colonization surveillance program. It is not intended to diagnose MRSA infection nor to guide or monitor treatment for MRSA infections.   Blood culture (routine x 2)     Status: None (Preliminary result)   Collection Time: 06/16/15 10:15 AM  Result Value Ref Range Status   Specimen Description BLOOD LEFT ARM  Final   Special  Requests BOTTLES DRAWN AEROBIC AND ANAEROBIC 5CC  Final   Culture NO GROWTH 4 DAYS  Final   Report Status PENDING  Incomplete  Blood culture (routine x 2)     Status: None (Preliminary result)   Collection Time: 06/16/15 10:39 AM  Result Value Ref Range Status   Specimen Description BLOOD LEFT ARM  Final   Special Requests BOTTLES DRAWN AEROBIC AND ANAEROBIC 3CC  Final   Culture NO GROWTH 4 DAYS  Final   Report Status PENDING  Incomplete  Urine culture     Status: None   Collection Time: 06/16/15 11:37 AM  Result Value Ref Range Status   Specimen Description URINE, CLEAN CATCH  Final   Special Requests NONE  Final   Culture MULTIPLE SPECIES PRESENT, SUGGEST RECOLLECTION  Final   Report Status 06/17/2015 FINAL  Final  Culture, Urine     Status: None   Collection Time: 06/18/15  4:33 AM  Result Value Ref Range Status   Specimen Description URINE, CLEAN CATCH  Final   Special Requests NONE  Final   Culture NO GROWTH 1 DAY  Final   Report Status 06/19/2015 FINAL  Final     Studies:   Recent x-ray studies have been reviewed in detail by the Attending Physician  Scheduled Meds:  Scheduled Meds: . sodium chloride    Intravenous Once  . sodium chloride   Intravenous Once  . antiseptic oral rinse  7 mL Mouth Rinse BID  . carvedilol  25 mg Oral BID WC  . cefTAZidime (FORTAZ)  IV  1 g Intravenous Q24H  . darbepoetin (ARANESP) injection - NON-DIALYSIS  100 mcg Subcutaneous Q Sat-1800  . furosemide  60 mg Intravenous BID  . hydrALAZINE  35 mg Oral 3 times per day  . lanthanum  500 mg Oral TID WC  . methylPREDNISolone (SOLU-MEDROL) injection  60 mg Intravenous Q12H  . metolazone  5 mg Oral Daily  . QUEtiapine  50 mg Oral QHS  . sodium bicarbonate  650 mg Oral TID  . sodium chloride  3 mL Intravenous Q12H  . vancomycin  750 mg Intravenous Q24H    Time spent on care of this patient: 35 mins   Jeanean Hollett T , MD   [facility] Office  [phone] Pager - Text Page per Loretha Stapler as per below:  On-Call/Text Page:      [email or web address]      password [email or web address]  If 7PM-7AM, please contact night-coverage [email or web address] Password [email or web address] 06/20/2015, 2:20 PM   LOS: 4 days

## 2015-06-20 NOTE — Progress Notes (Signed)
ANTICOAGULATION CONSULT NOTE - Initial Consult  Pharmacy Consult for heparin Indication: pulmonary embolus/DVT, lupus history  Allergies  Allergen Reactions  . Amoxicillin Rash  . Ciprofloxacin Itching and Rash  . Sulfa Antibiotics Rash    Patient Measurements: Height:  (149.9 cm) Weight: 156 lb (70.761 kg) IBW/kg (Calculated) : 43.2 Heparin Dosing Weight: ~60kg  Vital Signs: Temp: 98.4 F (36.9 C) (09/26 1119) Temp Source: Oral (09/26 1119) BP: 138/103 mmHg (09/26 1428) Pulse Rate: 90 (09/26 0737)  Labs:  Recent Labs  06/18/15 0425 06/19/15 0610 06/20/15 0224  HGB 8.7* 8.7* 8.8*  HCT 26.8* 27.1* 26.9*  PLT 428* 462* 444*  CREATININE 5.57* 5.28* 5.16*    Estimated Creatinine Clearance: 13.6 mL/min (by C-G formula based on Cr of 5.16).   Medical History: Past Medical History  Diagnosis Date  . Lupus   . Arthritis   . Thyroid disease   . Diabetes mellitus without complication   . PE (pulmonary embolism)   . DVT (deep venous thrombosis)      Assessment: 28 yof on apixaban PTA (started 05/01/15) for hx lupus anticoag and DVT/PE s/p reversal with FEIBA for hemoptysis/possible diffuse alveolar hemorrhage (DAH) - PCCM doesn't believe DAH. All anticoagulation was held (last dose 06/14/15), and patient refusing SCDs, TEDhose. Plt 444 stable, INR 1.5, Hg 8.8 - PRBC transfused 1u on 9/23.  Per Dr. Mohammed Kindle - ok to resume IV heparin today, transition to warfarin or eliquis once vascular access addressed.  Goal of Therapy:  Heparin level 0.3-0.7 units/ml Monitor platelets by anticoagulation protocol: Yes   Plan:  - Heparin IV @ 1000 units/h (no bolus) - Will not check baseline HL/aPTT since >72h since last apixaban dose - 8h HL, daily HL/CBC - Monitor s/sx bleeding  Babs Bertin, PharmD Clinical Pharmacist Pager [phone] 06/20/2015 3:23 PM

## 2015-06-20 NOTE — Progress Notes (Signed)
Pharmacist Heart Failure Core Measure Documentation  Assessment: Paige Aguilar has an EF documented as 20-25% on 06/17/15.  Rationale: Heart failure patients with left ventricular systolic dysfunction (LVSD) and an EF < 40% should be prescribed an angiotensin converting enzyme inhibitor (ACEI) or angiotensin receptor blocker (ARB) at discharge unless a contraindication is documented in the medical record.  This patient is not currently on an ACEI or ARB for HF.  This note is being placed in the record in order to provide documentation that a contraindication to the use of these agents is present for this encounter.  ACE Inhibitor or Angiotensin Receptor Blocker is contraindicated (specify all that apply)    ACEI allergy AND ARB allergy   Angioedema   Moderate or severe aortic stenosis   Hyperkalemia   Hypotension   Renal artery stenosis   Worsening renal function, preexisting renal disease or dysfunction   Babs Bertin, PharmD Clinical Pharmacist Pager [phone] 06/20/2015 10:36 AM

## 2015-06-20 NOTE — Progress Notes (Signed)
Name: Paige Aguilar MRN: [id number] DOB: 1986/06/15    ADMISSION DATE:  06/16/2015 CONSULTATION DATE:  06/17/2015  REFERRING MD :  Dr. Joseph Art, [facility]  CHIEF COMPLAINT:  Hemoptysis and tachycardia  BRIEF PATIENT DESCRIPTION: 29 year old female with history of severe lupus chronically on steroids (prednisone 20 mg daily).  Presents to the hospital with hemoptysis.  She was admitted 9/18 then discharged 9/20 for SOB, required BiPAP at the time.  Was diuresed and discharged.  She also has history of stage 4 CKD.  Presents back on 9/22 with hemoptysis.  CXR with diffuse opacification noted.  PCCM consulted for [facility].  Hemoptysis described as frank blood with no clots or sputum mixed in.  Bright in color.  No fever/chills.  Fluctuating weight up and down but no specific weight loss.  Lives in a shelter currently. She was on eliquis for h/o VTE    SIGNIFICANT EVENTS  9/22 admission and hemoptysis  STUDIES:  9/23 CT of the chest >> diffuse B infiltrates, small B effusions.    SUBJECTIVE:    VITAL SIGNS: Temp:  [97.8 F (36.6 C)-98.3 F (36.8 C)] 98 F (36.7 C) (09/26 0737) Pulse Rate:  [86-108] 90 (09/26 0737) Resp:  [13-18] 15 (09/26 0737) BP: (124-145)/(83-109) 125/97 mmHg (09/26 0737) SpO2:  [96 %-100 %] 97 % (09/26 0737) Weight:  [70.761 kg (156 lb)] 70.761 kg (156 lb) (09/26 0301)  PHYSICAL EXAMINATION: General:  Chronically ill appearing obese female. Neuro:  Alert and interactive, moving all ext to command. Head: [place]/AT. EENT:  PERRL, EOM-I and MMM. Cardiovascular:  RRR, Nl S1/S2, -M/R/G. Lungs:  Diffuse crackles. Abdomen:  Soft, NT, ND and +BS. Musculoskeletal:  -edema and -tenderness. Skin:  Intact.   Recent Labs Lab 06/18/15 0425 06/19/15 0610 06/20/15 0224  NA 140 136 131*  K 4.7 4.6 4.3  CL 105 99* 93*  CO2 19* 22 23  BUN 111* 126* 132*  CREATININE 5.57* 5.28* 5.16*  GLUCOSE 206* 213* 412*    Recent Labs Lab 06/18/15 0425 06/19/15 0610 06/20/15 0224    HGB 8.7* 8.7* 8.8*  HCT 26.8* 27.1* 26.9*  WBC 14.0* 14.5* 13.2*  PLT 428* 462* 444*   Dg Chest 2 View  06/19/2015   CLINICAL DATA:  Patient with shortness of breath for 1 week.  EXAM: CHEST  2 VIEW  COMPARISON:  Chest CT 06/17/2015  FINDINGS: Multi lead AICD device overlies the left hemi thorax, leads are stable in position. Stable cardiomegaly. Interval improvement in previously described diffuse bilateral airspace pulmonary opacities. Interval decrease in size of small bilateral pleural effusions. No pneumothorax. Thoracic spine degenerative changes.  IMPRESSION: Interval improvement in previously described airspace opacities which may represent improving edema and or infection.   Electronically Signed   By: Annia Belt M.D.   On: 06/19/2015 10:01   I reviewed CXR myself, diffuse patchy infiltrate.  ASSESSMENT / PLAN:  29 year old female with lupus, dilated CM and history of DVT and PE, on eliquis as outpt. 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  Presented with progressive, waxing/waning SOB associated with bilateral pulmonary infiltrates, and then hemoptysis that started in the hospital on this admit. She is immunosuppressed, at risk for opportunistic infxn. DDX for infiltrates is broad, included cardiogenic edema (in setting renal dysfxn), pneumonitis with associated alveolar hemorrhage, infection. Given rapid resolution of infiltrates on this (and prior) occasion I am most suspicious of cardiogenic edema. She is 8.6L net negative this admission. Suspect that hemoptysis was  related to bronchial irritation on Eliquis, but certainly could also be due to either infxn or SLE pneumonitis flare.   Recs:  - defer FOB at this time; resolution of infiltrates makes atypical infection less likely here - agree with volume removal as able; she has benefited from this. - Restart IV heparin, transition to coumadin or back to eliquis once vascular access addressed - Would change her  back to Pred, start  qd and then wean back to her home dose  over 1 week.  - headed for HD soon, dr Eliott Nine considering options for HD access - agree with empiric abx, although HCAP less likely with quickly resolving infiltrates. Would consider narrowing to PO abx and completing 8 days total.  - looks stable to transfer to floor from my perspective   Levy Pupa, MD, PhD 06/20/2015, 10:55 AM [facility] [phone] or if no answer [phone]

## 2015-06-20 NOTE — Progress Notes (Signed)
Inpatient Diabetes Program Recommendations  AACE/ADA: New Consensus Statement on Inpatient Glycemic Control (2015)  Target Ranges:  Prepandial:   less than 140 mg/dL      Peak postprandial:   less than 180 mg/dL (1-2 hours)      Critically ill patients:  140 - 180 mg/dL   Results for JODI, CRISCUOLO (MRN [id number]) as of 06/20/2015 09:57  Ref. Range 06/17/2015 09:15 06/18/2015 04:25 06/19/2015 06:10 06/20/2015 02:24  Glucose Latest Ref Range: 65-99 mg/dL 409 (H) 811 (H) 914 (H) 412 (H)   Review of Glycemic Control  Diabetes history: DM 2 Outpatient Diabetes medications: Diet Controlled Current orders for Inpatient glycemic control: None  Inpatient Diabetes Program Recommendations: Insulin - Basal: Due to Solumedrol 60 mg Q12 hrs and Glucose in the 400's. Please start low dose basal insulin. Levemir 12 units Q 24hrs. Insulin - Meal Coverage: Steroid induced hyperglycemia into the 400's in labs at 0224 am. Please start Novolog Sensitive Correction TID + HS scale.  Thanks,  Christena Deem RN, MSN, [facility] Inpatient Diabetes Coordinator Team Pager [phone] (8a-5p)

## 2015-06-20 NOTE — Progress Notes (Signed)
[facility] ROUNDING NOTE   Subjective:   Interval History: Appears improved no complaints today no dyspnea and wanting to go home  Objective:  Vital signs in last 24 hours:  Temp:  [97.8 F (36.6 C)-98.4 F (36.9 C)] 98.4 F (36.9 C) (09/26 1119) Pulse Rate:  [86-108] 90 (09/26 0737) Resp:  [13-18] 13 (09/26 1119) BP: (124-145)/(83-109) 135/90 mmHg (09/26 1119) SpO2:  [96 %-99 %] 96 % (09/26 1119) Weight:  [70.761 kg (156 lb)] 70.761 kg (156 lb) (09/26 0301)  Weight change: -0.739 kg (-1 lb 10.1 oz) Filed Weights   06/18/15 0400 06/19/15 0500 06/20/15 0301  Weight: 71.7 kg (158 lb 1.1 oz) 71.5 kg (157 lb 10.1 oz) 70.761 kg (156 lb)    Intake/Output: I/O last 3 completed shifts: In: 560 [P.O.:360; IV Piggyback:200] Out: 6175 [Urine:6175]   Intake/Output this shift:  Total I/O In: 240 [P.O.:240] Out: -   Gen: Soft spoken AAF NAD Skin: no rash, cyanosis + tattoos Neck: no JVD noted ICD left ant chest Chest: Tachy S1S2 No s3 or S4 Abdomen: soft, obese, no focal tenderness Ext: Trace edema of both LE's No asterixus Neuro: alert, Ox3, no focal deficit Heme/Lymph: no bruising or LAN   Basic Metabolic Panel:  Recent Labs Lab 06/17/15 0240 06/17/15 0915 06/18/15 0425 06/19/15 0610 06/20/15 0224  NA 138 138 140 136 131*  K 6.0* 5.7* 4.7 4.6 4.3  CL 109 108 105 99* 93*  CO2 17* 17* 19* 22 23  GLUCOSE 165* 125* 206* 213* 412*  BUN 93* 98* 111* 126* 132*  CREATININE 5.25* 5.31* 5.57* 5.28* 5.16*  CALCIUM 8.4* 8.8* 8.5* 8.6* 8.3*  MG  --  1.6* 2.7* 2.6* 2.1  PHOS  --   --  8.5* 7.9* 7.1*    Liver Function Tests:  Recent Labs Lab 06/16/15 0850 06/17/15 0915 06/18/15 0425 06/19/15 0610 06/20/15 0224  AST 27 25  --   --   --   ALT 15 15  --   --   --   ALKPHOS 61 49  --   --   --   BILITOT 0.5 0.5  --   --   --   PROT 6.0* 6.5  --   --   --   ALBUMIN 2.8* 2.7* 2.6* 2.4* 2.5*   No results for input(s): LIPASE, AMYLASE in the last 168  hours. No results for input(s): AMMONIA in the last 168 hours.  CBC:  Recent Labs Lab 06/16/15 0850 06/17/15 0240 06/17/15 0915 06/18/15 0425 06/19/15 0610 06/20/15 0224  WBC 14.3* 13.4* 12.7* 14.0* 14.5* 13.2*  NEUTROABS 8.7*  --  10.3* 12.5* 12.0* 11.7*  HGB 8.4* 7.1* 7.8* 8.7* 8.7* 8.8*  HCT 26.8* 23.2* 24.3* 26.8* 27.1* 26.9*  MCV 91.8 91.0 90.7 88.7 86.9 87.9  PLT 514* 457* 482* 428* 462* 444*    Cardiac Enzymes: No results for input(s): CKTOTAL, CKMB, CKMBINDEX, TROPONINI in the last 168 hours.  BNP: Invalid input(s): POCBNP  CBG:  Recent Labs Lab 06/16/15 1614  GLUCAP 248*    Microbiology: Results for orders placed or performed during the hospital encounter of 06/16/15  Blood culture (routine x 2)     Status: None (Preliminary result)   Collection Time: 06/16/15 10:15 AM  Result Value Ref Range Status   Specimen Description BLOOD LEFT ARM  Final   Special Requests BOTTLES DRAWN AEROBIC AND ANAEROBIC 5CC  Final   Culture NO GROWTH 3 DAYS  Final   Report Status PENDING  Incomplete  Blood culture (routine x 2)     Status: None (Preliminary result)   Collection Time: 06/16/15 10:39 AM  Result Value Ref Range Status   Specimen Description BLOOD LEFT ARM  Final   Special Requests BOTTLES DRAWN AEROBIC AND ANAEROBIC 3CC  Final   Culture NO GROWTH 3 DAYS  Final   Report Status PENDING  Incomplete  Urine culture     Status: None   Collection Time: 06/16/15 11:37 AM  Result Value Ref Range Status   Specimen Description URINE, CLEAN CATCH  Final   Special Requests NONE  Final   Culture MULTIPLE SPECIES PRESENT, SUGGEST RECOLLECTION  Final   Report Status 06/17/2015 FINAL  Final  Culture, Urine     Status: None   Collection Time: 06/18/15  4:33 AM  Result Value Ref Range Status   Specimen Description URINE, CLEAN CATCH  Final   Special Requests NONE  Final   Culture NO GROWTH 1 DAY  Final   Report Status 06/19/2015 FINAL  Final    Coagulation Studies: No  results for input(s): LABPROT, INR in the last 72 hours.  Urinalysis: No results for input(s): COLORURINE, LABSPEC, PHURINE, GLUCOSEU, HGBUR, BILIRUBINUR, KETONESUR, PROTEINUR, UROBILINOGEN, NITRITE, LEUKOCYTESUR in the last 72 hours.  Invalid input(s): APPERANCEUR    Imaging: Dg Chest 2 View  06/19/2015   CLINICAL DATA:  Patient with shortness of breath for 1 week.  EXAM: CHEST  2 VIEW  COMPARISON:  Chest CT 06/17/2015  FINDINGS: Multi lead AICD device overlies the left hemi thorax, leads are stable in position. Stable cardiomegaly. Interval improvement in previously described diffuse bilateral airspace pulmonary opacities. Interval decrease in size of small bilateral pleural effusions. No pneumothorax. Thoracic spine degenerative changes.  IMPRESSION: Interval improvement in previously described airspace opacities which may represent improving edema and or infection.   Electronically Signed   By: Annia Belt M.D.   On: 06/19/2015 10:01     Medications:     . sodium chloride   Intravenous Once  . sodium chloride   Intravenous Once  . antiseptic oral rinse  7 mL Mouth Rinse BID  . carvedilol  25 mg Oral BID WC  . cefTAZidime (FORTAZ)  IV  1 g Intravenous Q24H  . darbepoetin (ARANESP) injection - NON-DIALYSIS  100 mcg Subcutaneous Q Sat-1800  . furosemide  60 mg Intravenous BID  . hydrALAZINE  35 mg Oral 3 times per day  . lanthanum  500 mg Oral TID WC  . methylPREDNISolone (SOLU-MEDROL) injection  60 mg Intravenous Q12H  . metolazone  5 mg Oral Daily  . QUEtiapine  50 mg Oral QHS  . sodium bicarbonate  650 mg Oral TID  . sodium chloride  3 mL Intravenous Q12H  . vancomycin  750 mg Intravenous Q24H   sodium chloride, acetaminophen, ALPRAZolam, LORazepam, metoprolol, ondansetron (ZOFRAN) IV, oxyCODONE, sodium chloride  Assessment/ Plan:  1. 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 - biopsied (she thinks twice) in the past but results N/A as pt can't recall where it  was done, but never treated with CTX or MMF. Missed her outpt renal followup with Dr. Marisue Humble. Urine sediment + protein, 7-10 RBC). 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 Doubtful that at this advanced stage that repeating a renal bx would be helpful. Checking complements and dsDNA as indicators of lupus activity (pending). Agree with steroids, but more aggressive "empiric renal directed therapy" not indicated. She appears to be headed toward dialysis (not  necessarily this admission). Discussed with her that we should proceed with vascular access evaluation when clinically more stable. Save left arm. Vein mapping ordered. Will consult [facility] on Monday for access evaluation. Non-oliguric on diuretics. Creatinine is actually a little better today, BUN higher mostly d/t steroids. No dialysis indications at this time.  2. 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 Hypertension - with tachycardia. Increase carvedilol to 25 BID  5. Hemoptysis/bilateral pulmonary infiltrates - pulmonary has not seen yet today. Yesterday Dr. Vassie Loll indicated that he felt hemoptysis d/t anticoagulation and doubted DAH. Remains on vanco and fortaz for HCAP, and lasix/metolazone for decompensated CHF. Methyprednisolone is now at 60 Q12 with plans to taper back to usual dosing. Anticoagulation is still on hold. Repeat CXR today.  6. Metabolic acidosis - improving on oral sodium bicarbonate  Now at 23    LOS: 4 WEBB,MARTIN W  :13 PM

## 2015-06-20 NOTE — Hospital Discharge Follow-Up (Signed)
Met with the patient to discuss any additional discharge needs. She said that she does not have a discharge date yet.  She noted that she has been staying at the [facility] with the [facility] and her son has been staying with her. She reported no additional needs at this time.  She has an appt scheduled at the [facility] on 06/22/15 @ 0945 and will reschedule if needed.   Will continue to follow her hospitalization.

## 2015-06-21 ENCOUNTER — Telehealth: Payer: Self-pay

## 2015-06-21 ENCOUNTER — Encounter (HOSPITAL_COMMUNITY): Payer: Medicaid Other

## 2015-06-21 DIAGNOSIS — R0489 Hemorrhage from other sites in respiratory passages: Secondary | ICD-10-CM

## 2015-06-21 DIAGNOSIS — I82409 Acute embolism and thrombosis of unspecified deep veins of unspecified lower extremity: Secondary | ICD-10-CM

## 2015-06-21 DIAGNOSIS — Z86711 Personal history of pulmonary embolism: Secondary | ICD-10-CM

## 2015-06-21 LAB — GLUCOSE, CAPILLARY
GLUCOSE-CAPILLARY: 186 mg/dL — AB (ref 65–99)
Glucose-Capillary: 188 mg/dL — ABNORMAL HIGH (ref 65–99)

## 2015-06-21 LAB — RENAL FUNCTION PANEL
ANION GAP: 19 — AB (ref 5–15)
Albumin: 2.8 g/dL — ABNORMAL LOW (ref 3.5–5.0)
BUN: 143 mg/dL — ABNORMAL HIGH (ref 6–20)
CHLORIDE: 90 mmol/L — AB (ref 101–111)
CO2: 23 mmol/L (ref 22–32)
Calcium: 8.7 mg/dL — ABNORMAL LOW (ref 8.9–10.3)
Creatinine, Ser: 5.05 mg/dL — ABNORMAL HIGH (ref 0.44–1.00)
GFR, EST AFRICAN AMERICAN: 12 mL/min — AB (ref >60)
GFR, EST NON AFRICAN AMERICAN: 11 mL/min — AB (ref >60)
Glucose, Bld: 161 mg/dL — ABNORMAL HIGH (ref 65–99)
POTASSIUM: 4.4 mmol/L (ref 3.5–5.1)
Phosphorus: 5.9 mg/dL — ABNORMAL HIGH (ref 2.5–4.6)
Sodium: 132 mmol/L — ABNORMAL LOW (ref 135–145)

## 2015-06-21 LAB — CULTURE, BLOOD (ROUTINE X 2)
CULTURE: NO GROWTH
Culture: NO GROWTH

## 2015-06-21 LAB — ANTIPHOSPHOLIPID SYNDROME EVAL, BLD
DRVVT: 51.3 s (ref 0.0–55.1)
PTT Lupus Anticoagulant: 45.1 s (ref 0.0–50.0)
Phosphatydalserine, IgA: 1 APS IgA (ref 0–20)
Phosphatydalserine, IgG: 5 GPS IgG (ref 0–11)
Phosphatydalserine, IgM: 4 MPS IgM (ref 0–25)

## 2015-06-21 LAB — CBC
HEMATOCRIT: 32.1 % — AB (ref 36.0–46.0)
HEMOGLOBIN: 10.5 g/dL — AB (ref 12.0–15.0)
MCH: 28.9 pg (ref 26.0–34.0)
MCHC: 32.7 g/dL (ref 30.0–36.0)
MCV: 88.4 fL (ref 78.0–100.0)
Platelets: 497 10*3/uL — ABNORMAL HIGH (ref 150–400)
RBC: 3.63 MIL/uL — AB (ref 3.87–5.11)
RDW: 15.9 % — ABNORMAL HIGH (ref 11.5–15.5)
WBC: 19.1 10*3/uL — AB (ref 4.0–10.5)

## 2015-06-21 LAB — QUANTIFERON IN TUBE
QFT TB AG MINUS NIL VALUE: 0 IU/mL
QUANTIFERON MITOGEN VALUE: 0.12 IU/mL
QUANTIFERON TB AG VALUE: 0.02 IU/mL
QUANTIFERON TB GOLD: UNDETERMINED
Quantiferon Nil Value: 0.02 IU/mL

## 2015-06-21 LAB — S CEREVISIAE ABS
S CEREVISIAE IGG AB: 35.9 U — AB (ref <=20.0)
S cerevisiae IgA ab: 8.3 U (ref <=20.0)

## 2015-06-21 LAB — QUANTIFERON TB GOLD ASSAY (BLOOD)

## 2015-06-21 LAB — HEPARIN LEVEL (UNFRACTIONATED): HEPARIN UNFRACTIONATED: 0.54 [IU]/mL (ref 0.30–0.70)

## 2015-06-21 MED ORDER — LANTHANUM CARBONATE 500 MG PO CHEW: 500.0000 mg | CHEWABLE_TABLET | Freq: Three times a day (TID) | ORAL | Status: AC

## 2015-06-21 MED ORDER — PREDNISONE 20 MG PO TABS: 20.0000 mg | ORAL_TABLET | Freq: Every day | ORAL | Status: AC

## 2015-06-21 MED ORDER — WARFARIN SODIUM 7.5 MG PO TABS: 7.5000 mg | ORAL_TABLET | Freq: Once | ORAL | Status: AC

## 2015-06-21 MED ORDER — INSULIN GLARGINE 100 UNIT/ML ~~LOC~~ SOLN: 14.0000 [IU] | Freq: Every day | SUBCUTANEOUS | Status: AC

## 2015-06-21 MED ORDER — PREDNISONE 20 MG PO TABS: 20.0000 mg | ORAL_TABLET | Freq: Every day | ORAL | Status: DC

## 2015-06-21 MED ORDER — ENOXAPARIN SODIUM 80 MG/0.8ML ~~LOC~~ SOLN: 80.0000 mg | SUBCUTANEOUS | Status: AC

## 2015-06-21 MED ORDER — SODIUM BICARBONATE 650 MG PO TABS: 650.0000 mg | ORAL_TABLET | Freq: Three times a day (TID) | ORAL | Status: AC

## 2015-06-21 MED ORDER — METOLAZONE 5 MG PO TABS: 5.0000 mg | ORAL_TABLET | Freq: Every day | ORAL | Status: AC

## 2015-06-21 MED ORDER — CEFUROXIME AXETIL 500 MG PO TABS: 500.0000 mg | ORAL_TABLET | Freq: Two times a day (BID) | ORAL | Status: AC

## 2015-06-21 MED ORDER — FUROSEMIDE 20 MG PO TABS: 60.0000 mg | ORAL_TABLET | Freq: Two times a day (BID) | ORAL | Status: AC

## 2015-06-21 NOTE — Progress Notes (Signed)
Entered room and patient heparin gtt off. Instructed patient not to turn off pump we would manage it.Stated it said infusion complete, so instructed to call nurse not turn off pump.Patient verbalized understanding.

## 2015-06-21 NOTE — Progress Notes (Signed)
ANTICOAGULATION CONSULT NOTE - Follow Up Consult  Pharmacy Consult for heparin Indication: pulmonary embolus and DVT   Labs:  Recent Labs  06/18/15 0425 06/19/15 0610 06/20/15 0224 06/20/15 2305  HGB 8.7* 8.7* 8.8*  --   HCT 26.8* 27.1* 26.9*  --   PLT 428* 462* 444*  --   HEPARINUNFRC  --   --   --  0.47  CREATININE 5.57* 5.28* 5.16*  --      Assessment/Plan:  28yo female therapeutic on heparin with initial dosing while oral anticoag on hold. Will continue gtt at current rate and confirm stable with am labs.   Vernard Gambles, PharmD, BCPS  06/21/2015,12:01 AM

## 2015-06-21 NOTE — Telephone Encounter (Signed)
Attempted to contact the patient to check on her status and to remind her of her appt at [facility] tomorrow.  Call placed to # [phone] (M) and a voice mail message was left requesting a call back to # [phone] or [phone].

## 2015-06-21 NOTE — Progress Notes (Signed)
   Name: Paige Aguilar MRN: [id number] DOB: [birth year]    ADMISSION DATE:  06/16/2015 CONSULTATION DATE:  06/17/2015  REFERRING MD :  Dr. Joseph Art, [facility]  CHIEF COMPLAINT:  Hemoptysis and tachycardia  BRIEF PATIENT DESCRIPTION: 29 year old female with history of severe lupus chronically on steroids (prednisone 20 mg daily).  Presents to the hospital with hemoptysis.  She was admitted 9/18 then discharged 9/20 for SOB, required BiPAP at the time.  Was diuresed and discharged.  She also has history of stage 4 CKD.  Presents back on 9/22 with hemoptysis.  CXR with diffuse opacification noted.  PCCM consulted for [facility].  Hemoptysis described as frank blood with no clots or sputum mixed in.  Bright in color.  No fever/chills.  Fluctuating weight up and down but no specific weight loss.  Lives in a shelter currently. She was on eliquis for h/o VTE    SIGNIFICANT EVENTS  9/22 admission and hemoptysis > C4 low, DS-DNA positive 9/25 well diuresed, breathing much improved 9/26 Room air  STUDIES:  9/23 CT of the chest >> diffuse B infiltrates, small B effusions.    SUBJECTIVE:  Feeling much better, wants to leave   VITAL SIGNS: Temp:  [97.7 F (36.5 C)-98.4 F (36.9 C)] 98 F (36.7 C) (09/27 0917) Pulse Rate:  [67-91] 91 (09/26 2052) Resp:  [12-16] 16 (09/26 2052) BP: (127-154)/(90-110) 127/93 mmHg (09/26 2052) SpO2:  [92 %-99 %] 98 % (09/26 2052)  PHYSICAL EXAMINATION: General:  No distress HENT: NCAT PULM: CTA B, no wheeze or crackles, normal effort CV: RRR, no mg GI: BS+, soft MSK: normal bulk/tone Neuro: A&Ox4, maew   Recent Labs Lab 06/19/15 0610 06/20/15 0224 06/21/15 0406  NA 136 131* 132*  K 4.6 4.3 4.4  CL 99* 93* 90*  CO2 BUN 126* 132* 143*  CREATININE 5.28* 5.16* 5.05*  GLUCOSE 213* 412* 161*    Recent Labs Lab 06/19/15 0610 06/20/15 0224 06/21/15 0406  HGB 8.7* 8.8* 10.5*  HCT 27.1* 26.9* 32.1*  WBC 14.5* 13.2* 19.1*  PLT 462* 444* 497*     9/24 CXR personally reviewed> bilateral patchy infiltrates improved slightly 9/23 CT chest personally reviewed> peripheral sparing of bilateral GGO and consolidation, bibasilar pleural effusions  ASSESSMENT / PLAN:  Acute hypoxemic respiratory failure dramatically better; I think that this was due primarily to acute pulmonary edema in the setting of renal failure, but the low C4, high DS-DNA, and degree of peripheral sparing on the CT chest could suggest some degree of pneumonitis.  A bronchoscopy will not help sort out this diagnosis at this point.  I don't think this was an atypical infection as she improved with minimal antibiotics and in the face of increasing immunosuppression with steroids.   Has history of PE Hemoptysis: resolved Progressively worsening renal failure  Recs:  - would use warfarin for anticoagulation given renal failure and history of DVT/PE - consider bridging her with lovenox as she wants to leave the hospital - I have decreased her prednisone to  > I think she should stay on this dose until she sees [place] in clinic in ~ 4 weeks with a Chest X-ray -  Would treat with Abx for 8 days total as recommended by Dr. Delton Coombes - needs outpatient nephrology follow up  - looks stable for d/c home from my perspective   Heber North Star, MD [place] [facility] Pager: [phone] Cell: [phone] After 3pm or if no response, call [phone]

## 2015-06-21 NOTE — Progress Notes (Signed)
ANTICOAGULATION CONSULT NOTE  Pharmacy Consult for heparin Indication: pulmonary embolus/DVT, lupus history  Allergies  Allergen Reactions  . Amoxicillin Rash  . Ciprofloxacin Itching and Rash  . Sulfa Antibiotics Rash    Patient Measurements: Height:  (149.9 cm) Weight: 156 lb (70.761 kg) IBW/kg (Calculated) : 43.2 Heparin Dosing Weight: ~60kg  Vital Signs: Temp: 98 F (36.7 C) (09/27 0917) Temp Source: Oral (09/27 0917)  Labs:  Recent Labs  06/19/15 0610 06/20/15 0224 06/20/15 2305 06/21/15 0406 06/21/15 0426  HGB 8.7* 8.8*  --  10.5*  --   HCT 27.1* 26.9*  --  32.1*  --   PLT 462* 444*  --  497*  --   HEPARINUNFRC  --   --  0.47  --  0.54  CREATININE 5.28* 5.16*  --  5.05*  --     Estimated Creatinine Clearance: 13.9 mL/min (by C-G formula based on Cr of 5.05).   Medical History: Past Medical History  Diagnosis Date  . Lupus   . Arthritis   . Thyroid disease   . Diabetes mellitus without complication   . PE (pulmonary embolism)   . DVT (deep venous thrombosis)      Assessment: 28 yof on apixaban PTA (started 05/01/15) for hx lupus anticoag and DVT/PE s/p reversal with FEIBA for hemoptysis/possible diffuse alveolar hemorrhage (DAH) - PCCM doesn't believe DAH. All anticoagulation was held (last dose 06/14/15).   Per Dr. Mohammed Kindle - ok to resume IV heparin 9/26, transition to warfarin or eliquis once vascular access addressed.  HL therapeutic on 1000 units/h. RN called to say pt turned off her own pump ~0430 this AM, RN found at 0700 (after HL was drawn). Will continue at current rate and follow daily HLs. Plt 497 (up) stable, Hg 10.5 (up)  Goal of Therapy:  Heparin level 0.3-0.7 units/ml Monitor platelets by anticoagulation protocol: Yes   Plan:  - Heparin IV @ 1000 units/h - Daily HL/CBC - Monitor s/sx bleeding  Babs Bertin, PharmD Clinical Pharmacist Pager [phone] 06/21/2015 10:06 AM

## 2015-06-21 NOTE — Care Management Note (Signed)
Case Management Note  Patient Details  Name: Paige Aguilar MRN: [id number] Date of Birth: 12/23/1985  Subjective/Objective:                 CM followed for progression and d/c planning.   Action/Plan: No HH or DME needs identified at this time.   Expected Discharge Date:     06/21/2015             Expected Discharge Plan:  Home/Self Care  In-House Referral:     Discharge planning Services  NA  Post Acute Care Choice:  NA Choice offered to:  NA  DME Arranged:    DME Agency:     HH Arranged:    HH Agency:     Status of Service:  Completed, signed off  Medicare Important Message Given:    Date Medicare IM Given:    Medicare IM give by:    Date Additional Medicare IM Given:    Additional Medicare Important Message give by:     If discussed at Long Length of Stay Meetings, dates discussed:    Additional Comments:  Starlyn Skeans, RN 06/21/2015, 4:45 PM

## 2015-06-21 NOTE — Discharge Summary (Signed)
Discharge Summary  Paige Aguilar EAV:[id number] DOB: 04-15-1986  PCP: [name], MD  Admit date: 06/16/2015 Discharge date: 06/21/2015  Time spent: >55mins  Recommendations for Outpatient Follow-up:  1. F/u with PMD within a week for hospital discharge follow up, pmd to monitor INR 2. F/u with nephrology within two weeks.  Discharge Diagnoses:  Active Hospital Problems   Diagnosis Date Noted  . CKD (chronic kidney disease)   . History of pulmonary embolism   . Pulmonary alveolar hemorrhage   . Blood poisoning   . HCAP (healthcare-associated pneumonia)   . Pulmonary embolus   . Hypomagnesemia   . Hyperkalemia   . Lupus (systemic lupus erythematosus)   . MCTD (mixed connective tissue disease)   . Pulmonary hypertension   . Acute respiratory failure with hypoxemia 06/16/2015  . Hemoptysis 06/16/2015  . Acute on chronic combined systolic and diastolic HF (heart failure), NYHA class 3 06/13/2015  . DVT (deep venous thrombosis) 06/12/2015  . Acute renal failure superimposed on stage 4 chronic kidney disease 01/18/2015  . SLE (systemic lupus erythematosus) 01/18/2015    Resolved Hospital Problems   Diagnosis Date Noted Date Resolved  No resolved problems to display.    Discharge Condition: stable  Diet recommendation: heart healthy/carb modified  Filed Weights   06/18/15 0400 06/19/15 0500 06/20/15 0301  Weight: 158 lb 1.1 oz (71.7 kg) 157 lb 10.1 oz (71.5 kg) 156 lb (70.761 kg)    History of present illness:  Paige Aguilar is a 29 y.o. female  With a history of lupus, DVT and presumed PE on Eliquis, diet-controlled diabetes, congestive heart failure with a ejection fraction of 25-30% with diffuse hypokinesis and grade 1 diastolic dysfunction on 05/01/2015. The patient was recently hospitalized 4 days ago with chest pain and green sputum production. She was discharged to days ago on Omnicef to complete her antibiotic regimen. Additionally, she will was  recently diagnosed in the beginning of August with a DVT. The patient did have chest pain at that time and was presumed to have a PE, although a VQ scan was negative. The patient awoke this morning acutely short of breath, which was worse with laying down and activity and improved with sitting up and rest. Her breathing worsened while at home. EMS was called the patient was found to be restless and using accessory muscles with labored breathing. While in the emergency department, the patient did have several episodes of hemoptysis which totaled approximately 50 mL's.  Hospital Course:  Active Problems:   Acute renal failure superimposed on stage 4 chronic kidney disease   SLE (systemic lupus erythematosus)   DVT (deep venous thrombosis)   Acute on chronic combined systolic and diastolic HF (heart failure), NYHA class 3   Acute respiratory failure with hypoxemia   Hemoptysis   HCAP (healthcare-associated pneumonia)   Pulmonary embolus   Hypomagnesemia   Hyperkalemia   Lupus (systemic lupus erythematosus)   MCTD (mixed connective tissue disease)   Pulmonary hypertension   History of pulmonary embolism   Pulmonary alveolar hemorrhage   Blood poisoning   CKD (chronic kidney disease)  Acute respiratory failure with hypoxemia / HCAP -Improved on nasal cannula titrate O2 to assure SPO2> 93% -wean off oxygen, on room air at discharge.  Hemoptysis / Diffuse Alveolar Hemorrhage -PCXR: PNA vs Diffuse AlveolarHemorrhage -Contacted PCCM Dr. Tyson Alias and Molli Knock who reviewed the CXR and concur with high likelihood of Diffuse Alveolar Hemorrhage with possible underlying HCAP . CHF and MCTD (lupus) may also be contributing factor. -  anticoagulation was held initially during hospitalization, and resumed later per Pulm suggestion, using heparin until clear if HD access to be placed this admit, patient did not want to stay for vein mapping, anticoagulation changed to coumadin with lovenox bridging per pulm  recommendation. 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 Appropriate serologies ordered and pending -Continue diuresis per nephrology  -Avoid nephrotoxic drugs -Vein mapping per nephrology; patient most likely will require dialysis in the near future -care per Nephrology  SLE -home dose Prednisone  Daily  -MCTD panel pending  DVT/PE -per Pulmonary /critical care recommendation, start coumadin with lovenox bridging, discussed with patient, she is comfortable with this plan and follow up with pmd for INR check.  Code Status: FULL Family Communication: no family present at time of exam Disposition Plan:  Home with pmd /nephrology/VVS follow up. Patient is cleared by pulmonary/critical care and nephrology to discharge home  Consultants: PCCM Nephrology  Procedures: 9/23 TTE EF=20%- 25%. -Doppler parameters c/wrestrictive physiology -Findings C/W RV volume and pressure overload - Mitral valve: moderate regurgitation -Tricuspid valve: severe regurgitation. - Pulmonary arteries: PApeak pressure: 70 mm Hg (S).  Antibiotics: Ceftazidime 9/22 > 9/25 Vancomycin 9/22 > 9/25 Ceftin 9/26 >  DVT prophylaxis: IV heparin Discharge Exam: BP 127/93 mmHg  Pulse 91  Temp(Src) 98 F (36.7 C) (Oral)  Resp 16  Ht  (1.499 m)  Wt 156 lb (70.761 kg)  BMI 31.49 kg/m2  SpO2 98%  LMP 04/27/2015 (Approximate)  General: * Cardiovascular: * Respiratory: *  Discharge Instructions You were  cared for by a hospitalist during your hospital stay. If you have any questions about your discharge medications or the care you received while you were in the hospital after you are discharged, you can call the unit and asked to speak with the hospitalist on call if the hospitalist that took care of you is not available. Once you are discharged, your primary care physician will handle any further medical issues. Please note that NO REFILLS for any discharge medications will be authorized once you are discharged, as it is imperative that you return to your primary care physician (or establish a relationship with a primary care physician if you do not have one) for your aftercare needs so that they can reassess your need for medications and monitor your lab values.  Discharge Instructions    Diet - low sodium heart healthy    Complete by:  As directed      Increase activity slowly    Complete by:  As directed             Medication List    STOP taking these medications        apixaban 5 MG Tabs tablet  Commonly known as:  ELIQUIS      TAKE these medications        carvedilol 12.5 MG tablet  Commonly known as:  COREG  Take 1 tablet (12.5 mg total) by mouth 2 (two) times daily with a meal.     cefUROXime 500 MG  tablet  Commonly known as:  CEFTIN  Take 1 tablet (500 mg total) by mouth 2 (two) times daily with a meal.     enoxaparin 80 MG/0.8ML injection  Commonly known as:  LOVENOX  Inject 0.8 mLs (80 mg total) into the skin daily.     furosemide 20 MG tablet  Commonly known as:  LASIX  Take 3 tablets (60 mg total) by mouth 2 (two) times daily.     hydrALAZINE 25 MG tablet  Commonly known as:  APRESOLINE  Take 1 tablet (25 mg total) by mouth every 8 (eight) hours.     insulin glargine 100 UNIT/ML injection  Commonly known as:  LANTUS  Inject 0.14 mLs (14 Units total) into the skin at bedtime.     lanthanum 500 MG chewable tablet  Commonly known as:  FOSRENOL  Chew 1 tablet  (500 mg total) by mouth 3 (three) times daily with meals.     metolazone 5 MG tablet  Commonly known as:  ZAROXOLYN  Take 1 tablet (5 mg total) by mouth daily.     predniSONE 20 MG tablet  Commonly known as:  DELTASONE  Take 1 tablet (20 mg total) by mouth daily with breakfast.  Start taking on:  06/22/2015     QUEtiapine 50 MG tablet  Commonly known as:  SEROQUEL  Take 50 mg by mouth at bedtime.     sodium bicarbonate 650 MG tablet  Take 1 tablet (650 mg total) by mouth 3 (three) times daily.     warfarin 7.5 MG tablet  Commonly known as:  COUMADIN  Take 1 tablet (7.5 mg total) by mouth one time only at 6 PM.       Allergies  Allergen Reactions  . Amoxicillin Rash  . Ciprofloxacin Itching and Rash  . Sulfa Antibiotics Rash       Follow-up Information    Follow up with [facility]     On 06/22/2015.   Why:  [facility] appointment on 06/22/15 at 9:45 am.   Contact information:   [address] [place] [address] [phone]      Follow up with [name], MD On 06/24/2015.   Specialty:  Family Medicine   Why:  please see your primary doctor every three days to monitor your INR    Contact information:   [address] [place] [address] [phone]       Follow up with DUNHAM,CYNTHIA B, MD In 1 week.   Specialty:  Nephrology   Why:  monitor renal function   Contact information:   [address] [place] [address] [phone]       Follow up with Gretta Began, MD In 2 weeks.   Specialties:  Vascular Surgery, Cardiology   Why:  dialysis access   Contact information:   [address] [place] [address] [phone]        The results of significant diagnostics from this hospitalization (including imaging, microbiology, ancillary and laboratory) are listed below for reference.    Significant Diagnostic Studies: Ct Abdomen Pelvis Wo Contrast  06/12/2015   CLINICAL DATA:   Low abdominal pain with bloating and gas. Chronic kidney disease.  EXAM: CT ABDOMEN AND PELVIS WITHOUT CONTRAST  TECHNIQUE: Multidetector CT imaging of the abdomen and pelvis was performed following the standard protocol without IV contrast.  COMPARISON:  01/18/2015  FINDINGS: Lung bases: Moderate enlargement of the heart. Minimal amount of pleural fluid.  Dependent lung base atelectasis. No pulmonary edema.  Liver, spleen, gallbladder, pancreas, adrenal glands:  Unremarkable.  Kidneys, ureters, bladder: No renal stones. No hydronephrosis. Mass suggested from the lower pole right kidney on the prior exam is no longer evident. No evidence of a renal mass. Ureters are normal course and in caliber. Bladder is unremarkable.  Uterus and adnexa:  Unremarkable.  Lymph nodes:  No adenopathy.  Ascites:  None.  Gastrointestinal: There are scattered colonic diverticula. No diverticulitis. Colon otherwise unremarkable. Small bowel. Normal appendix.  Musculoskeletal: Mild compression fracture of T12. Schmorl's node along the upper endplate of L2. No osteoblastic or osteolytic lesions. Bony findings are stable.  IMPRESSION: 1. No acute findings. 2. No evidence of bowel obstruction or ileus. 3. No renal or ureteral stones.  No hydronephrosis.   Electronically Signed   By: Amie Portland M.D.   On: 06/12/2015 18:50   Dg Chest 2 View  06/19/2015   CLINICAL DATA:  Patient with shortness of breath for 1 week.  EXAM: CHEST  2 VIEW  COMPARISON:  Chest CT 06/17/2015  FINDINGS: Multi lead AICD device overlies the left hemi thorax, leads are stable in position. Stable cardiomegaly. Interval improvement in previously described diffuse bilateral airspace pulmonary opacities. Interval decrease in size of small bilateral pleural effusions. No pneumothorax. Thoracic spine degenerative changes.  IMPRESSION: Interval improvement in previously described airspace opacities which may represent improving edema and or infection.   Electronically  Signed   By: Annia Belt M.D.   On: 06/19/2015 10:01   Dg Chest 2 View  06/12/2015   CLINICAL DATA:  Shortness of breath, right chest pain  EXAM: CHEST  2 VIEW  COMPARISON:  05/23/2015  FINDINGS: Cardiomegaly is noted. Dual lead cardiac pacemaker is unchanged in position. No acute infiltrate or pulmonary edema. Mild thickening of right minor fissure.  IMPRESSION: No active cardiopulmonary disease.  Cardiomegaly.   Electronically Signed   By: Natasha Mead M.D.   On: 06/12/2015 16:31   Ct Chest Wo Contrast  06/17/2015   CLINICAL DATA:  Chest pain and shortness of breath. Diffuse airspace disease on plain films.  EXAM: CT CHEST WITHOUT CONTRAST  TECHNIQUE: Multidetector CT imaging of the chest was performed following the standard protocol without IV contrast.  COMPARISON:  06/16/2015 chest x-ray.  Chest CT 01/18/2015.  FINDINGS: There is cardiomegaly. Pacer is in place with leads in the right atrium and right ventricle. Aorta is normal caliber. No mediastinal, hilar, or axillary adenopathy.  Diffuse bilateral airspace opacities are noted sparing the periphery. Small bilateral pleural effusions are noted.  Chest wall soft tissues are unremarkable. Imaging into the upper abdomen shows no acute findings.  Mild T12 compression fracture through the superior endplate which appears to have progressed slightly since April.  IMPRESSION: Cardiomegaly. Diffuse bilateral airspace opacities with small effusions. Findings could reflect edema, infection or hemorrhage. Favor edema/CHF.  Progressive chronic T12 compression fracture.   Electronically Signed   By: Charlett Nose M.D.   On: 06/17/2015 19:21   Nm Pulmonary Perf And Vent  05/23/2015   CLINICAL DATA:  Acute respiratory distress. One week history of chest pain and cough.  EXAM: NUCLEAR MEDICINE VENTILATION - PERFUSION LUNG SCAN  TECHNIQUE: Ventilation images were obtained in multiple projections using inhaled aerosol Tc-21m DTPA. Perfusion images were obtained in  multiple projections after intravenous injection of Tc-79m MAA.  RADIOPHARMACEUTICALS:  42.7 Technetium-66m DTPA aerosol inhalation and 6.3 Technetium-80m MAA IV  COMPARISON:  Chest x-ray 04/26/2015  FINDINGS: Ventilation:  Some central clumping of the radiopharmaceutical. Subsegmental basilar defects likely due to atelectasis.  Perfusion: No wedge shaped peripheral perfusion defects to suggest acute pulmonary embolism. Left-sided photopenic area at due to pacemaker.  IMPRESSION: Negative V/Q scan for pulmonary embolism.   Electronically Signed   By: Rudie Meyer M.D.   On: 05/23/2015 18:58   Dg Chest Portable 1 View  06/16/2015   CLINICAL DATA:  Shortness of breath.  EXAM: PORTABLE CHEST - 1 VIEW  COMPARISON:  June 12, 2015.  FINDINGS: Stable cardiomegaly. New bilateral lung opacities are noted, with right greater than left. This is concerning for pneumonia or possibly edema. No pneumothorax is noted. Possible minimal right pleural effusion is noted. Left-sided pacemaker is noted and unchanged in position. Bony thorax is unremarkable.  IMPRESSION: New bilateral lung opacities are noted, with right greater than left. This is concerning for pneumonia or possibly edema. Minimal right pleural effusion is noted as well.   Electronically Signed   By: Lupita Raider, M.D.   On: 06/16/2015 09:19   Dg Chest Port 1 View  05/23/2015   CLINICAL DATA:  Right flank pain and shortness of breath  EXAM: PORTABLE CHEST - 1 VIEW  COMPARISON:  04/28/2015  FINDINGS: Dual-chamber ICD/pacer leads from the left are in unchanged position.  Chronic cardiomegaly with negative aortic and hilar contours.  Limited low volume evaluation of the lungs. There is no edema, consolidation, effusion, or pneumothorax. No osseous findings to explain pain.  IMPRESSION: 1. No active disease. 2. Chronic cardiomegaly.   Electronically Signed   By: Marnee Spring M.D.   On: 05/23/2015 15:10    Microbiology: Recent Results (from the past 240  hour(s))  Urine culture     Status: None   Collection Time: 06/12/15  4:49 PM  Result Value Ref Range Status   Specimen Description URINE, CLEAN CATCH  Final   Special Requests NONE  Final   Culture >=100,000 COLONIES/mL ESCHERICHIA COLI  Final   Report Status 06/15/2015 FINAL  Final   Organism ID, Bacteria ESCHERICHIA COLI  Final      Susceptibility   Escherichia coli - MIC*    AMPICILLIN >=32 RESISTANT Resistant     CEFAZOLIN <=4 SENSITIVE Sensitive     CEFTRIAXONE <=1 SENSITIVE Sensitive     CIPROFLOXACIN <=0.25 SENSITIVE Sensitive     GENTAMICIN <=1 SENSITIVE Sensitive     IMIPENEM <=0.25 SENSITIVE Sensitive     NITROFURANTOIN <=16 SENSITIVE Sensitive     TRIMETH/SULFA <=20 SENSITIVE Sensitive     AMPICILLIN/SULBACTAM 8 SENSITIVE Sensitive     PIP/TAZO <=4 SENSITIVE Sensitive     * >=100,000 COLONIES/mL ESCHERICHIA COLI  MRSA PCR Screening     Status: None   Collection Time: 06/12/15 10:43 PM  Result Value Ref Range Status   MRSA by PCR NEGATIVE NEGATIVE Final    Comment:        The GeneXpert MRSA Assay (FDA approved for NASAL specimens only), is one component of a comprehensive MRSA colonization surveillance program. It is not intended to diagnose MRSA infection nor to guide or monitor treatment for MRSA infections.   Blood culture (routine x 2)     Status: None (Preliminary result)   Collection Time: 06/16/15 10:15 AM  Result Value Ref Range Status   Specimen Description BLOOD LEFT ARM  Final   Special Requests BOTTLES DRAWN AEROBIC AND ANAEROBIC 5CC  Final   Culture NO GROWTH 4 DAYS  Final   Report Status PENDING  Incomplete  Blood culture (routine x 2)     Status: None (Preliminary result)   Collection Time: 06/16/15 10:39 AM  Result Value Ref Range Status   Specimen Description BLOOD LEFT ARM  Final   Special Requests BOTTLES DRAWN AEROBIC AND ANAEROBIC 3CC  Final   Culture NO GROWTH 4 DAYS  Final   Report Status PENDING  Incomplete  Urine culture      Status: None   Collection Time: 06/16/15 11:37 AM  Result Value Ref Range Status   Specimen Description URINE, CLEAN CATCH  Final   Special Requests NONE  Final   Culture MULTIPLE SPECIES PRESENT, SUGGEST RECOLLECTION  Final   Report Status 06/17/2015 FINAL  Final  Culture, Urine     Status: None   Collection Time: 06/18/15  4:33 AM  Result Value Ref Range Status   Specimen Description URINE, CLEAN CATCH  Final   Special Requests NONE  Final   Culture NO GROWTH 1 DAY  Final   Report Status 06/19/2015 FINAL  Final     Labs: Basic Metabolic Panel:  Recent Labs Lab 06/17/15 0915 06/18/15 0425 06/19/15 0610 06/20/15 0224 06/21/15 0406  NA 138 140 136 131* 132*  K 5.7* 4.7 4.6 4.3 4.4  CL 108 105 99* 93* 90*  CO2 17* 19* 22 23 23   GLUCOSE 125* 206* 213* 412* 161*  BUN 98* 111* 126* 132* 143*  CREATININE 5.31* 5.57* 5.28* 5.16* 5.05*  CALCIUM 8.8* 8.5* 8.6* 8.3* 8.7*  MG 1.6* 2.7* 2.6* 2.1  --   PHOS  --  8.5* 7.9* 7.1* 5.9*   Liver Function Tests:  Recent Labs Lab 06/16/15 0850 06/17/15 0915 06/18/15 0425 06/19/15 0610 06/20/15 0224 06/21/15 0406  AST 27 25  --   --   --   --   ALT 15 15  --   --   --   --   ALKPHOS 61 49  --   --   --   --   BILITOT 0.5 0.5  --   --   --   --   PROT 6.0* 6.5  --   --   --   --   ALBUMIN 2.8* 2.7* 2.6* 2.4* 2.5* 2.8*   No results for input(s): LIPASE, AMYLASE in the last 168 hours. No results for input(s): AMMONIA in the last 168 hours. CBC:  Recent Labs Lab 06/16/15 0850  06/17/15 0915 06/18/15 0425 06/19/15 0610 06/20/15 0224 06/21/15 0406  WBC 14.3*  < > 12.7* 14.0* 14.5* 13.2* 19.1*  NEUTROABS 8.7*  --  10.3* 12.5* 12.0* 11.7*  --   HGB 8.4*  < > 7.8* 8.7* 8.7* 8.8* 10.5*  HCT 26.8*  < > 24.3* 26.8* 27.1* 26.9* 32.1*  MCV 91.8  < > 90.7 88.7 86.9 87.9 88.4  PLT 514*  < > 482* 428* 462* 444* 497*  < > = values in this interval not displayed. Cardiac Enzymes: No results for input(s): CKTOTAL, CKMB, CKMBINDEX,  TROPONINI in the last 168 hours. BNP: BNP (last 3 results)  Recent Labs  05/23/15 1450 06/12/15 1456 06/16/15 0850  BNP 749.0* 915.5* 2425.6*    ProBNP (last 3 results) No results for input(s): PROBNP in the last 8760 hours.  CBG:  Recent Labs Lab 06/16/15 1614 06/18/15 0641 06/20/15 2047 06/21/15 0749  GLUCAP 248* 186* 229* 188*       Signed:  Xu,Fang MD, PhD  [facility] 06/21/2015, 11:57 AM

## 2015-06-21 NOTE — Progress Notes (Signed)
Patient Discharge:  Disposition: Pt discharged home with family  Education: Pt educated on medications, follow up appointments, and all discharge instructions. Pt verbalized understanding.  IV: Removed.   Telemetry: Removed. CCMD notified.  Follow-up appointments: Scheduled and reviewed with pt.  Prescriptions: Sent to pt's pharmacy  Transportation: Transported home by family.   Belongings: All belongings taken with pt.

## 2015-06-22 ENCOUNTER — Inpatient Hospital Stay: Payer: Medicaid Other | Admitting: Family Medicine

## 2015-06-22 ENCOUNTER — Telehealth: Payer: Self-pay

## 2015-06-22 LAB — ANCA TITERS: Atypical P-ANCA titer: <1:20 {titer}

## 2015-06-22 NOTE — Telephone Encounter (Signed)
Transitional Care Clinic at [facility]:  This Case Manager placed call to patient for post-discharge follow-up phone call. Also, call placed to patient because she missed initial Transitional Care Clinic at [facility] appointment today at 0945. Called patient to discuss rescheduling appointment for today at 1500. Patient indicated she would not be able to come in today or tomorrow because she will be seeing her PCP Dr. Ronne Binning tomorrow. She also indicated she had a follow-up appointment yesterday with her PCP. She indicated her PCP would be checking INR closely. Informed patient that she will not need to follow-up with the Transitional Care Clinic since she is already receiving close follow-up and medical management with her PCP.  Patient verbalized understanding. Dr. Venetia Night updated.

## 2015-07-08 ENCOUNTER — Emergency Department (HOSPITAL_COMMUNITY)
Admission: EM | Admit: 2015-07-08 | Discharge: 2015-07-26 | Disposition: E | Payer: Medicaid Other | Attending: Emergency Medicine | Admitting: Emergency Medicine

## 2015-07-08 DIAGNOSIS — Z79899 Other long term (current) drug therapy: Secondary | ICD-10-CM | POA: Insufficient documentation

## 2015-07-08 DIAGNOSIS — Z950 Presence of cardiac pacemaker: Secondary | ICD-10-CM | POA: Diagnosis not present

## 2015-07-08 DIAGNOSIS — Z792 Long term (current) use of antibiotics: Secondary | ICD-10-CM | POA: Insufficient documentation

## 2015-07-08 DIAGNOSIS — Z7952 Long term (current) use of systemic steroids: Secondary | ICD-10-CM | POA: Diagnosis not present

## 2015-07-08 DIAGNOSIS — Z794 Long term (current) use of insulin: Secondary | ICD-10-CM | POA: Insufficient documentation

## 2015-07-08 DIAGNOSIS — Z86718 Personal history of other venous thrombosis and embolism: Secondary | ICD-10-CM | POA: Diagnosis not present

## 2015-07-08 DIAGNOSIS — Z880 Allergy status to penicillin: Secondary | ICD-10-CM | POA: Diagnosis not present

## 2015-07-08 DIAGNOSIS — Z8739 Personal history of other diseases of the musculoskeletal system and connective tissue: Secondary | ICD-10-CM | POA: Diagnosis not present

## 2015-07-08 DIAGNOSIS — I469 Cardiac arrest, cause unspecified: Secondary | ICD-10-CM | POA: Diagnosis not present

## 2015-07-08 DIAGNOSIS — Z86711 Personal history of pulmonary embolism: Secondary | ICD-10-CM | POA: Insufficient documentation

## 2015-07-08 DIAGNOSIS — Z7901 Long term (current) use of anticoagulants: Secondary | ICD-10-CM | POA: Insufficient documentation

## 2015-07-08 DIAGNOSIS — E119 Type 2 diabetes mellitus without complications: Secondary | ICD-10-CM | POA: Diagnosis not present

## 2015-07-08 MED ORDER — CALCIUM CHLORIDE 10 % IV SOLN
INTRAVENOUS | Status: DC | PRN
  Administered 2015-07-08: 1 g via INTRAVENOUS

## 2015-07-08 MED ORDER — SODIUM BICARBONATE 8.4 % IV SOLN
INTRAVENOUS | Status: DC | PRN
  Administered 2015-07-08: 50 meq via INTRAVENOUS

## 2015-07-08 MED ORDER — EPINEPHRINE HCL 0.1 MG/ML IJ SOSY
PREFILLED_SYRINGE | INTRAMUSCULAR | Status: DC | PRN
  Administered 2015-07-08: 1 mg via INTRAVENOUS

## 2015-07-20 ENCOUNTER — Encounter (HOSPITAL_COMMUNITY): Payer: Medicaid Other

## 2015-07-20 ENCOUNTER — Ambulatory Visit: Payer: Medicaid Other | Admitting: Vascular Surgery

## 2015-07-20 ENCOUNTER — Other Ambulatory Visit (HOSPITAL_COMMUNITY): Payer: Medicaid Other

## 2015-07-20 ENCOUNTER — Other Ambulatory Visit: Payer: Self-pay | Admitting: *Deleted

## 2015-07-26 NOTE — Progress Notes (Signed)
   07/01/15 1200  Clinical Encounter Type  Visited With Health care provider;Family;Other (Comment) (friend)  Visit Type Death  Spiritual Encounters  Spiritual Needs Grief support  CH called to post CPR death; CH escorted family (cousin) to consult B; CH received next of kin number for mother to get authorization for cousin to be notified; CH available as needed.  12:07 PM Paige Aguilar

## 2015-07-26 NOTE — ED Provider Notes (Signed)
CSN: [id number]     Arrival date & time Jul 18, 2015  1134 History   First MD Initiated Contact with Patient 18-Jul-2015 1152     Chief Complaint  Patient presents with  . Cardiac Arrest     (Consider location/radiation/quality/duration/timing/severity/associated sxs/prior Treatment) HPI Comments: 29 year old female presenting via EMS in cardiac arrest, with CPR in progress. Paramedics report that she was found this morning unresponsive. Bystander CPR initiated. Upon their arrival, she was in PEA and CPR/ACLS was continued. She was given multiple rounds of epinephrine, as well as bicarbonate, calcium, and Narcan.  At one point, she transiently regained pulses. Primary rhythm was PEA driven by internal pacemaker. CPR continued for over an hour prior to arrival to the ED.  Level V caveat secondary to unresponsiveness.   Past Medical History  Diagnosis Date  . Lupus   . Arthritis   . Thyroid disease   . Diabetes mellitus without complication   . PE (pulmonary embolism)   . DVT (deep venous thrombosis)    Past Surgical History  Procedure Laterality Date  . Pacemaker insertion    . Cesarean section     No family history on file. Social History  Substance Use Topics  . Smoking status: Never Smoker   . Smokeless tobacco: Not on file  . Alcohol Use: No   OB History    No data available     Review of Systems  Unable to perform ROS: Patient unresponsive      Allergies  Amoxicillin; Ciprofloxacin; and Sulfa antibiotics  Home Medications   Prior to Admission medications   Medication Sig Start Date End Date Taking? Authorizing Provider  carvedilol (COREG) 12.5 MG tablet Take 1 tablet (12.5 mg total) by mouth 2 (two) times daily with a meal. 05/02/15   Leroy Sea, MD  cefUROXime (CEFTIN) 500 MG tablet Take 1 tablet (500 mg total) by mouth 2 (two) times daily with a meal. 06/21/15   Albertine Grates, MD  enoxaparin (LOVENOX) 80 MG/0.8ML injection Inject 0.8 mLs (80 mg total) into the skin  daily. 06/21/15   Albertine Grates, MD  furosemide (LASIX) 20 MG tablet Take 3 tablets (60 mg total) by mouth 2 (two) times daily. 06/21/15   Albertine Grates, MD  hydrALAZINE (APRESOLINE) 25 MG tablet Take 1 tablet (25 mg total) by mouth every 8 (eight) hours. 06/14/15   [name], MD  insulin glargine (LANTUS) 100 UNIT/ML injection Inject 0.14 mLs (14 Units total) into the skin at bedtime. 06/21/15   Albertine Grates, MD  lanthanum (FOSRENOL) 500 MG chewable tablet Chew 1 tablet (500 mg total) by mouth 3 (three) times daily with meals. 06/21/15   Albertine Grates, MD  metolazone (ZAROXOLYN) 5 MG tablet Take 1 tablet (5 mg total) by mouth daily. 06/21/15   Albertine Grates, MD  predniSONE (DELTASONE) 20 MG tablet Take 1 tablet (20 mg total) by mouth daily with breakfast. 06/22/15   Albertine Grates, MD  QUEtiapine (SEROQUEL) 50 MG tablet Take 50 mg by mouth at bedtime.    Historical Provider, MD  sodium bicarbonate 650 MG tablet Take 1 tablet (650 mg total) by mouth 3 (three) times daily. 06/21/15   Albertine Grates, MD  warfarin (COUMADIN) 7.5 MG tablet Take 1 tablet (7.5 mg total) by mouth one time only at 6 PM. 06/21/15   Albertine Grates, MD   BP 0/0 mmHg  Pulse 0  Resp 0  Wt 156 lb (70.761 kg)  LMP 04/27/2015 (Approximate) Physical Exam  Constitutional: She appears well-developed  and well-nourished.  HENT:  Head: Atraumatic.  ET tube in place  Eyes:  Pupils fixed and dilated  Neck: Neck supple.  Cardiovascular:  No heart sounds. No central or peripheral pulses.  Pulmonary/Chest:  No spontaneous respiratory effort. Course, but equal breath sounds when mechanically ventilated.  Abdominal:  Soft, nondistended.  Genitourinary:  Normal external genitalia  Musculoskeletal:  No apparent injuries, no significant edema.  Neurological:  Unresponsive. GCS 3.  Skin: Skin is warm and dry.  Psychiatric:  Unable to test.    ED Course  Procedures (including critical care time)  Cardiopulmonary Resuscitation (CPR) Procedure Note Directed/Performed by:  Merrie RoofWOFFORD III, Janyce Ellinger David I personally directed ancillary staff and/or performed CPR in an effort to regain return of spontaneous circulation and to maintain cardiac, neuro and systemic perfusion.    Labs Review Labs Reviewed - No data to display  Imaging Review No results found. I have personally reviewed and evaluated these images and lab results as part of my medical decision-making.   EKG Interpretation None      MDM   Final diagnoses:  Cardiac arrest [facility](H[facility])    29 year old female presenting in cardiac arrest. Approximately one hour of CPR prior to arrival. Initially, and transient return of spontaneous circulation, but this did not last long. Prolonged CPR since she last had pulses. Bedside echo showed no pericardial effusion.  It showed some occasional contractions, not organized. Additional epinephrine, bicarbonate, calcium were given without additional affect. Her pacemaker continued to fire, but she had no central or peripheral pulses, no arterial flow seen with Doppler ultrasound.  Time of death 11:41 AM.  Spoke with Raiford Nobleick, medical examiner, who said case was not an ME case.  Blake DivineJohn Brithney Bensen, MD Jul 26, 2015 [phone]

## 2015-07-26 NOTE — Progress Notes (Signed)
   2015/02/17 1300  Clinical Encounter Type  Visited With Patient;Patient and family together;Health care provider  Visit Type Psychological support;Spiritual support;Social support;Death;ED  Referral From Care management  Consult/Referral To Chaplain;Other (Comment)  Recommendations AC Needed  Spiritual Encounters  Spiritual Needs Emotional;Grief support  Stress Factors  Family Stress Factors Family relationships;Loss    Chaplain visited with grieving family members (Cousin and [place] Daughter). Chaplain gave emotional and spiritual support. The love ones decided to wait to allow more time for family members to get into town before seeing the Pt.  The family gave the okay to send Pt. down.the family plans [facility]

## 2015-07-26 NOTE — ED Notes (Addendum)
Per EMS, pt had an unwitnessed arrest at home. Pt was last seen last night. Pt was found by family in the hall, family initiated CPR. Upon EMS arrival pt was in PEA, EMS continued CPR giving EPI and intubating pt with 7.5 ETT. EMS gained ROSC after 10 minutes. EMS then saw v-tach on the monitor which they defibrillated  And gave 300mg  of amioderone. Pt then began to brady down and had decreased ETCO2. Pt then lost pulses again. EMS then administered a total of 9 epis, 1g of calcium, 1 amp of bi-carb and 2mg  of narcan. Pt has I/O in right and left tibia. CBG: 96

## 2015-07-26 DEATH — deceased

## 2015-08-25 ENCOUNTER — Ambulatory Visit: Payer: Medicaid Other | Admitting: Vascular Surgery

## 2015-08-25 ENCOUNTER — Encounter (HOSPITAL_COMMUNITY): Payer: Medicaid Other

## 2015-08-25 ENCOUNTER — Other Ambulatory Visit (HOSPITAL_COMMUNITY): Payer: Medicaid Other

## 2015-09-22 LAB — CBC WITH DIFFERENTIAL/PLATELET
BASOS PCT: 0 % (ref 0–1)
Basophils Absolute: 0 10*3/uL (ref 0.0–0.1)
EOS ABS: 0 10*3/uL (ref 0.0–0.7)
Eosinophils Relative: 0 % (ref 0–5)
HEMATOCRIT: 28.7 % — AB (ref 36.0–46.0)
HEMOGLOBIN: 9.6 g/dL — AB (ref 12.0–15.0)
Lymphocytes Relative: 20 % (ref 12–46)
Lymphs Abs: 1.8 10*3/uL (ref 0.7–4.0)
MCH: 28.2 pg (ref 26.0–34.0)
MCHC: 33.4 g/dL (ref 30.0–36.0)
MCV: 84.2 fL (ref 78.0–100.0)
MONOS PCT: 7 % (ref 3–12)
Monocytes Absolute: 0.7 10*3/uL (ref 0.1–1.0)
Neutro Abs: 6.5 10*3/uL (ref 1.7–7.7)
Neutrophils Relative %: 72 % (ref 43–77)
PLATELETS: 268 10*3/uL (ref 150–400)
RBC: 3.41 MIL/uL — ABNORMAL LOW (ref 3.87–5.11)
RDW: 18 % — AB (ref 11.5–15.5)
WBC: 9 10*3/uL (ref 4.0–10.5)

## 2016-10-26 IMAGING — CT CT ABD-PELV W/O CM
2 of 4 series · 17 of 46 positions shown, 19 images · non-contrast
Comparison: 01/18/2015

CLINICAL DATA: Low abdominal pain with bloating and gas. Chronic
kidney disease.

EXAM:
CT ABDOMEN AND PELVIS WITHOUT CONTRAST
TECHNIQUE: Multidetector CT imaging of the abdomen and pelvis was performed
following the standard protocol without IV contrast.

[Series 2: abd/ pelvis 5.0 i30f 1 · axial · 0.69mm/px · z∈[+747,+1122]mm · 14 of 83 slices shown, 16 images]
[im 4/83  soft-tissue]
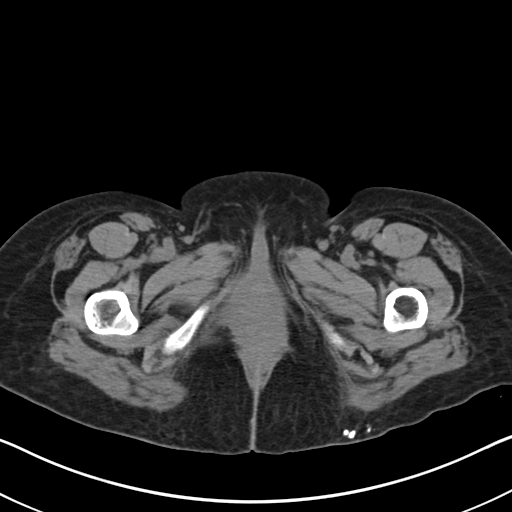
[im 4/83  bone]
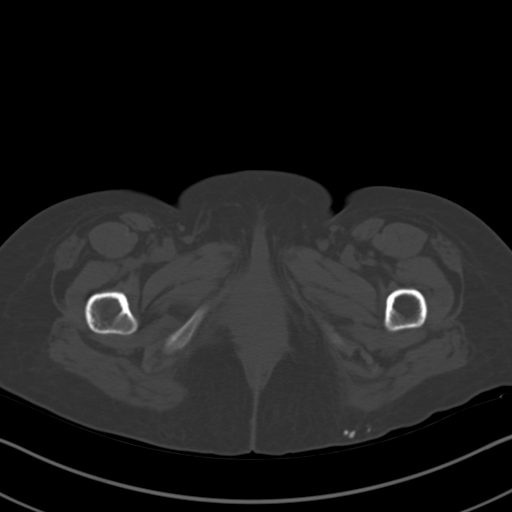
[im 10/83  soft-tissue]
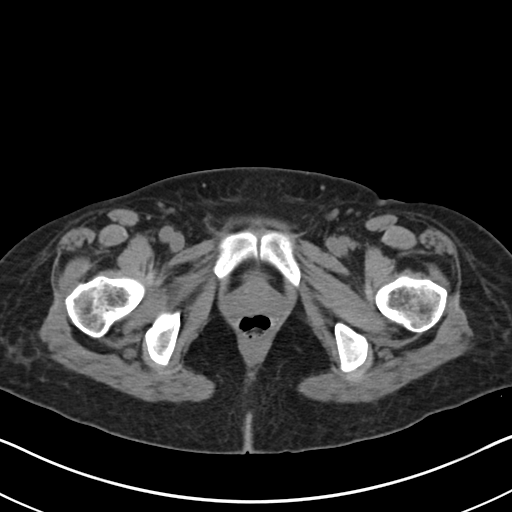
[im 17/83  soft-tissue]
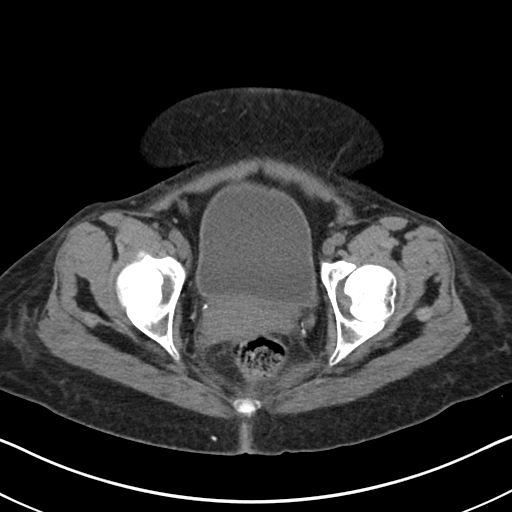
[im 23/83  soft-tissue]
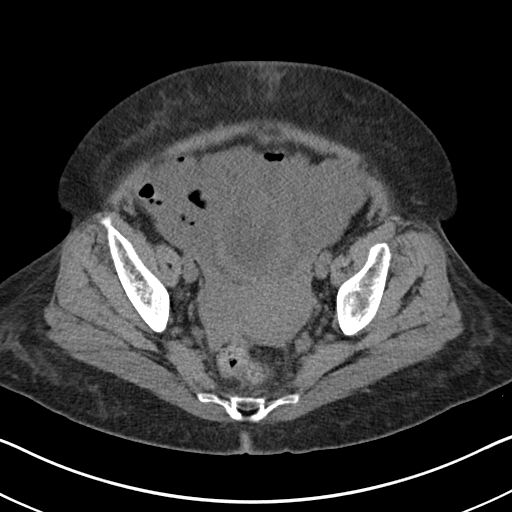
[im 27/83  soft-tissue]
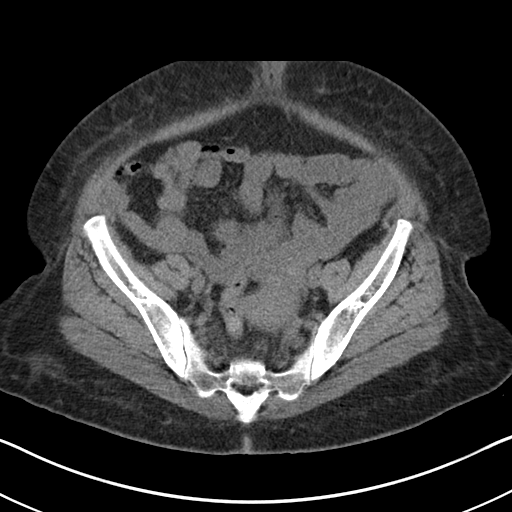
[im 33/83  soft-tissue]
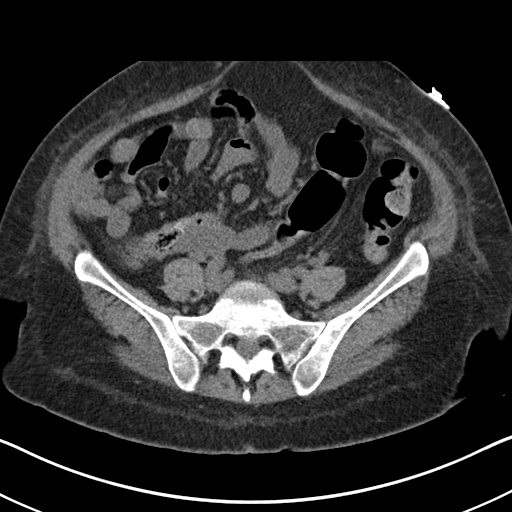
[im 40/83  soft-tissue]
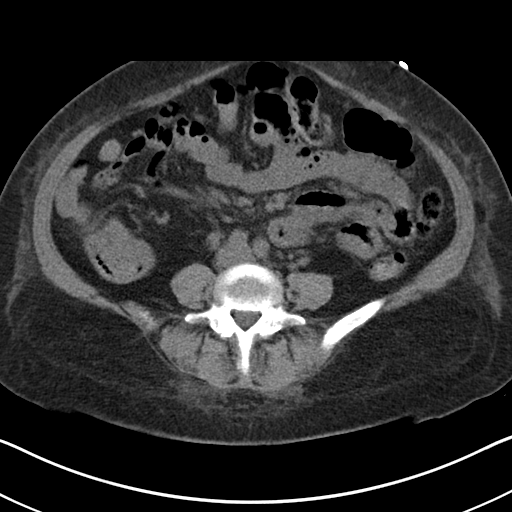
[im 43/83  soft-tissue]
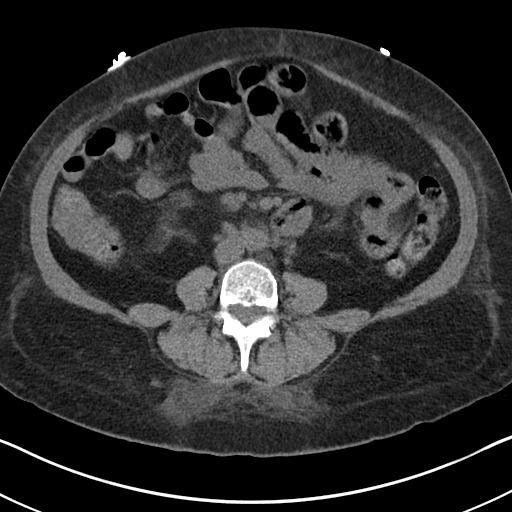
[im 50/83  soft-tissue]
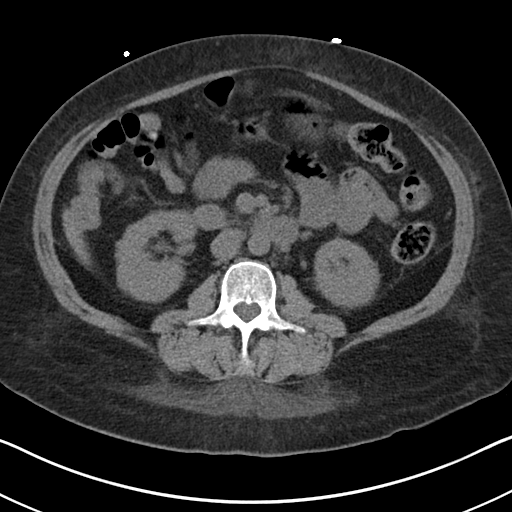
[im 50/83  bone]
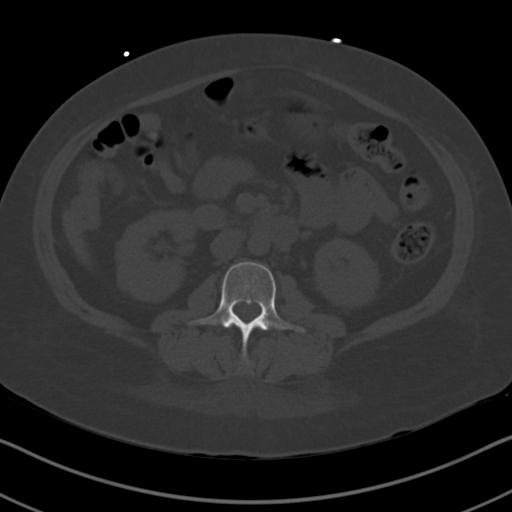
[im 56/83  soft-tissue]
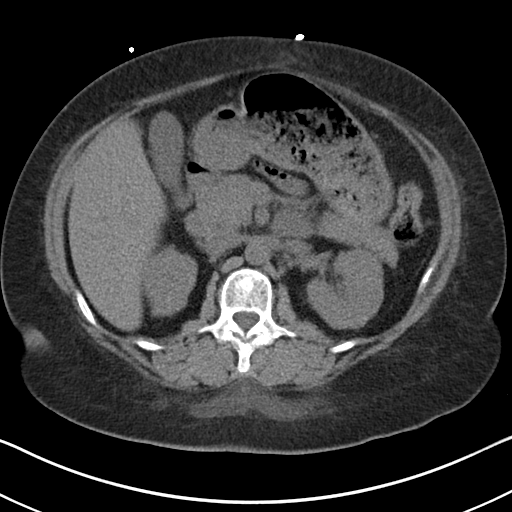
[im 63/83  soft-tissue]
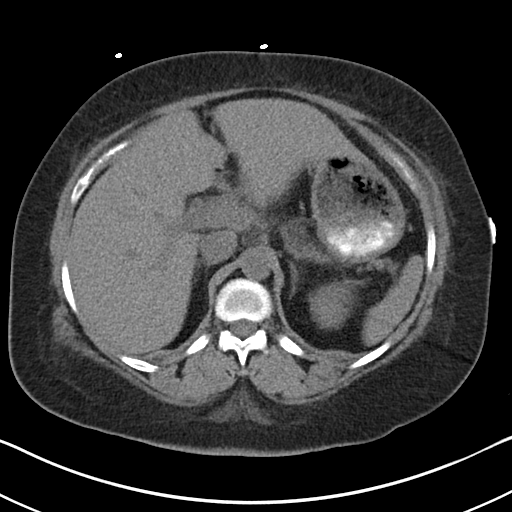
[im 66/83  soft-tissue]
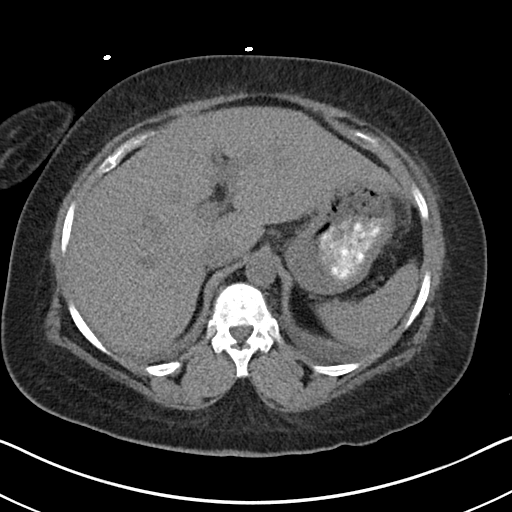
[im 73/83  soft-tissue]
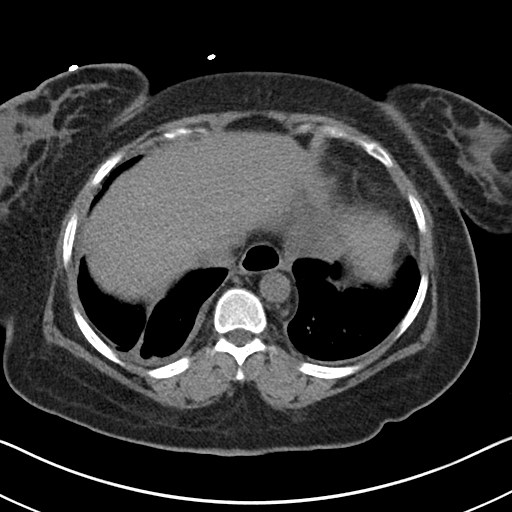
[im 79/83  soft-tissue]
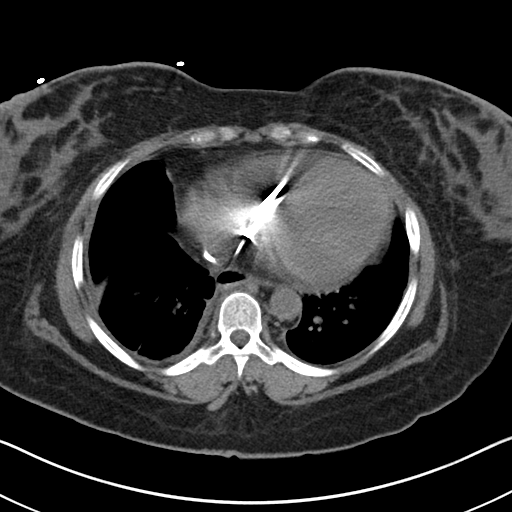

[Series 5: cor st · coronal · 0.76mm/px · 3 of 101 slices shown]
[im 34/101  soft-tissue]
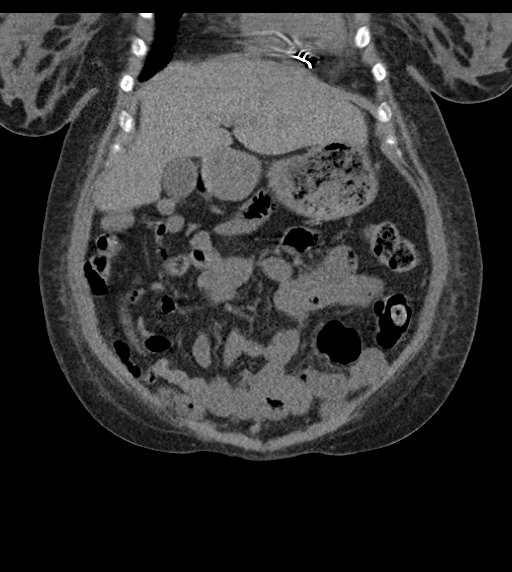
[im 45/101  soft-tissue]
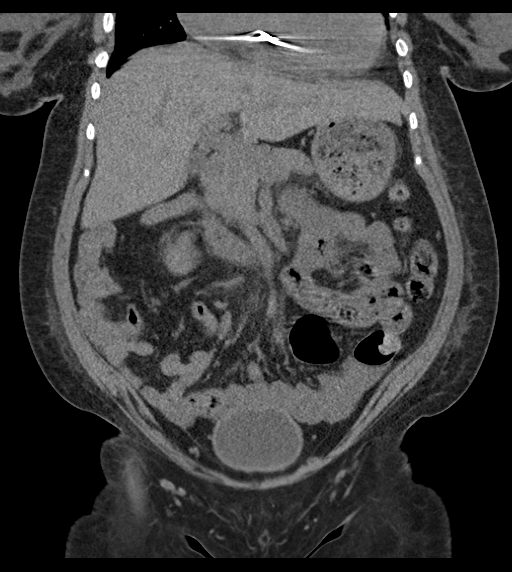
[im 56/101  soft-tissue]
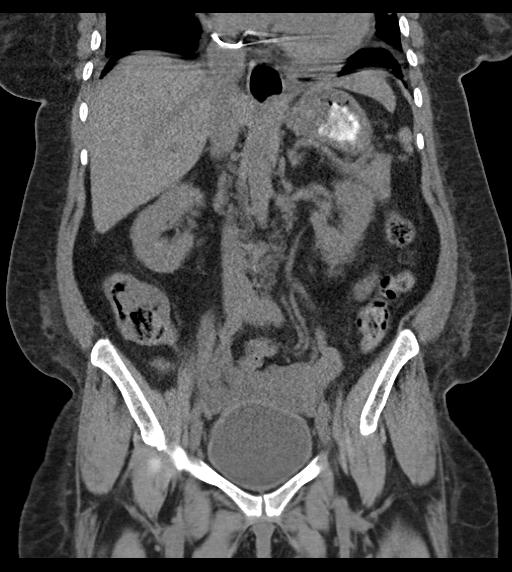

[17 of 46 positions shown; findings below may reference images not displayed]

FINDINGS: Lung bases: Moderate enlargement of the heart. Minimal amount of
pleural fluid. Dependent lung base atelectasis. No pulmonary edema.

Liver, spleen, gallbladder, pancreas, adrenal glands:  Unremarkable.

Kidneys, ureters, bladder: No renal stones. No hydronephrosis. Mass
suggested from the lower pole right kidney on the prior exam is no
longer evident. No evidence of a renal mass. Ureters are normal
course and in caliber. Bladder is unremarkable.

Uterus and adnexa:  Unremarkable.

Lymph nodes:  No adenopathy.

Ascites:  None.

Gastrointestinal: There are scattered colonic diverticula. No
diverticulitis. Colon otherwise unremarkable. Small bowel. Normal
appendix.

Musculoskeletal: Mild compression fracture of T12. Schmorl's node
along the upper endplate of L2. No osteoblastic or osteolytic
lesions. Bony findings are stable.
IMPRESSION: 1. No acute findings.
2. No evidence of bowel obstruction or ileus.
3. No renal or ureteral stones.  No hydronephrosis.
